# Patient Record
Sex: Male | Born: 1950 | Race: White | Hispanic: No | Smoking: Former smoker
Health system: Southern US, Community
[De-identification: ages and names within clinical notes are randomized; demographics above are authoritative.]

## PROBLEM LIST (undated history)

## (undated) DIAGNOSIS — J449 Chronic obstructive pulmonary disease, unspecified: Secondary | ICD-10-CM

## (undated) DIAGNOSIS — M797 Fibromyalgia: Secondary | ICD-10-CM

## (undated) DIAGNOSIS — C61 Malignant neoplasm of prostate: Secondary | ICD-10-CM

## (undated) DIAGNOSIS — M25512 Pain in left shoulder: Secondary | ICD-10-CM

## (undated) DIAGNOSIS — J4489 Other specified chronic obstructive pulmonary disease: Secondary | ICD-10-CM

## (undated) DIAGNOSIS — Z8619 Personal history of other infectious and parasitic diseases: Secondary | ICD-10-CM

## (undated) DIAGNOSIS — K589 Irritable bowel syndrome without diarrhea: Secondary | ICD-10-CM

## (undated) DIAGNOSIS — T8859XA Other complications of anesthesia, initial encounter: Secondary | ICD-10-CM

## (undated) DIAGNOSIS — T4145XA Adverse effect of unspecified anesthetic, initial encounter: Secondary | ICD-10-CM

## (undated) DIAGNOSIS — C801 Malignant (primary) neoplasm, unspecified: Secondary | ICD-10-CM

## (undated) DIAGNOSIS — E7800 Pure hypercholesterolemia, unspecified: Secondary | ICD-10-CM

## (undated) DIAGNOSIS — K219 Gastro-esophageal reflux disease without esophagitis: Secondary | ICD-10-CM

## (undated) DIAGNOSIS — Z923 Personal history of irradiation: Secondary | ICD-10-CM

## (undated) HISTORY — PX: PROSTATE BIOPSY: SHX241

## (undated) HISTORY — PX: APPENDECTOMY: SHX54

## (undated) HISTORY — DX: Other specified chronic obstructive pulmonary disease: J44.89

## (undated) HISTORY — DX: Irritable bowel syndrome, unspecified: K58.9

## (undated) HISTORY — DX: Pain in left shoulder: M25.512

## (undated) HISTORY — DX: Chronic obstructive pulmonary disease, unspecified: J44.9

## (undated) HISTORY — PX: CHOLECYSTECTOMY: SHX55

## (undated) HISTORY — DX: Pure hypercholesterolemia, unspecified: E78.00

## (undated) HISTORY — PX: WISDOM TOOTH EXTRACTION: SHX21

## (undated) HISTORY — PX: COLONOSCOPY: SHX174

## (undated) HISTORY — DX: Personal history of other infectious and parasitic diseases: Z86.19

---

## 1998-02-12 ENCOUNTER — Encounter: Payer: Self-pay | Admitting: *Deleted

## 1998-02-12 ENCOUNTER — Ambulatory Visit (HOSPITAL_COMMUNITY): Admission: RE | Admit: 1998-02-12 | Discharge: 1998-02-12 | Payer: Self-pay | Admitting: *Deleted

## 1998-05-10 ENCOUNTER — Emergency Department (HOSPITAL_COMMUNITY): Admission: EM | Admit: 1998-05-10 | Discharge: 1998-05-11 | Payer: Self-pay | Admitting: Emergency Medicine

## 2004-03-10 ENCOUNTER — Ambulatory Visit: Payer: Self-pay | Admitting: Psychology

## 2004-03-30 ENCOUNTER — Ambulatory Visit: Payer: Self-pay | Admitting: Psychology

## 2004-04-21 ENCOUNTER — Ambulatory Visit: Payer: Self-pay | Admitting: Psychology

## 2004-08-17 ENCOUNTER — Ambulatory Visit: Payer: Self-pay | Admitting: Psychology

## 2004-09-08 ENCOUNTER — Ambulatory Visit: Payer: Self-pay | Admitting: Psychology

## 2004-11-01 ENCOUNTER — Ambulatory Visit: Payer: Self-pay | Admitting: Psychology

## 2004-11-25 ENCOUNTER — Ambulatory Visit: Payer: Self-pay | Admitting: Psychology

## 2004-12-08 ENCOUNTER — Ambulatory Visit: Payer: Self-pay | Admitting: Psychology

## 2004-12-23 ENCOUNTER — Ambulatory Visit: Payer: Self-pay | Admitting: Pulmonary Disease

## 2004-12-24 ENCOUNTER — Ambulatory Visit: Payer: Self-pay | Admitting: Psychology

## 2004-12-29 ENCOUNTER — Ambulatory Visit: Payer: Self-pay | Admitting: Pulmonary Disease

## 2005-01-26 ENCOUNTER — Ambulatory Visit: Payer: Self-pay | Admitting: Psychology

## 2005-02-08 ENCOUNTER — Ambulatory Visit: Payer: Self-pay | Admitting: Psychology

## 2005-03-01 ENCOUNTER — Ambulatory Visit: Payer: Self-pay | Admitting: Psychology

## 2005-03-25 ENCOUNTER — Ambulatory Visit: Payer: Self-pay | Admitting: Psychology

## 2005-04-12 ENCOUNTER — Ambulatory Visit: Admission: RE | Admit: 2005-04-12 | Discharge: 2005-04-29 | Payer: Self-pay | Admitting: Radiation Oncology

## 2005-04-14 ENCOUNTER — Ambulatory Visit: Payer: Self-pay | Admitting: Psychology

## 2005-05-05 ENCOUNTER — Ambulatory Visit: Payer: Self-pay | Admitting: Psychology

## 2005-05-26 ENCOUNTER — Ambulatory Visit: Payer: Self-pay | Admitting: Psychology

## 2005-06-24 ENCOUNTER — Ambulatory Visit: Payer: Self-pay | Admitting: Psychology

## 2005-07-26 ENCOUNTER — Emergency Department (HOSPITAL_COMMUNITY): Admission: EM | Admit: 2005-07-26 | Discharge: 2005-07-26 | Payer: Self-pay | Admitting: Emergency Medicine

## 2005-08-05 ENCOUNTER — Ambulatory Visit: Payer: Self-pay | Admitting: Psychology

## 2005-08-18 ENCOUNTER — Ambulatory Visit (HOSPITAL_COMMUNITY): Admission: RE | Admit: 2005-08-18 | Discharge: 2005-08-18 | Payer: Self-pay | Admitting: General Surgery

## 2005-08-18 ENCOUNTER — Encounter (INDEPENDENT_AMBULATORY_CARE_PROVIDER_SITE_OTHER): Payer: Self-pay | Admitting: Specialist

## 2005-08-25 ENCOUNTER — Ambulatory Visit: Payer: Self-pay | Admitting: Psychology

## 2005-09-08 ENCOUNTER — Ambulatory Visit: Payer: Self-pay | Admitting: Psychology

## 2005-09-22 ENCOUNTER — Ambulatory Visit: Payer: Self-pay | Admitting: Pulmonary Disease

## 2005-10-06 ENCOUNTER — Ambulatory Visit: Payer: Self-pay | Admitting: Psychology

## 2005-10-31 ENCOUNTER — Ambulatory Visit: Payer: Self-pay | Admitting: Psychology

## 2006-06-07 ENCOUNTER — Ambulatory Visit: Payer: Self-pay | Admitting: Pulmonary Disease

## 2006-06-09 ENCOUNTER — Ambulatory Visit: Payer: Self-pay | Admitting: Pulmonary Disease

## 2006-06-09 LAB — CONVERTED CEMR LAB
ALT: 31 units/L (ref 0–40)
AST: 30 units/L (ref 0–37)
Albumin: 3.9 g/dL (ref 3.5–5.2)
Alkaline Phosphatase: 64 units/L (ref 39–117)
BUN: 15 mg/dL (ref 6–23)
Basophils Absolute: 0 10*3/uL (ref 0.0–0.1)
Basophils Relative: 0.6 % (ref 0.0–1.0)
Bilirubin, Direct: 0.1 mg/dL (ref 0.0–0.3)
CO2: 29 meq/L (ref 19–32)
Calcium: 9.3 mg/dL (ref 8.4–10.5)
Chloride: 109 meq/L (ref 96–112)
Cholesterol: 168 mg/dL (ref 0–200)
Creatinine, Ser: 1.2 mg/dL (ref 0.4–1.5)
Eosinophils Absolute: 0.1 10*3/uL (ref 0.0–0.6)
Eosinophils Relative: 2.2 % (ref 0.0–5.0)
GFR calc Af Amer: 81 mL/min
GFR calc non Af Amer: 67 mL/min
Glucose, Bld: 99 mg/dL (ref 70–99)
HCT: 41.2 % (ref 39.0–52.0)
HDL: 37.8 mg/dL — ABNORMAL LOW (ref >39.0)
Hemoglobin: 14.4 g/dL (ref 13.0–17.0)
LDL Cholesterol: 117 mg/dL — ABNORMAL HIGH (ref 0–99)
Lymphocytes Relative: 27.7 % (ref 12.0–46.0)
MCHC: 34.9 g/dL (ref 30.0–36.0)
MCV: 91.5 fL (ref 78.0–100.0)
Monocytes Absolute: 0.4 10*3/uL (ref 0.2–0.7)
Monocytes Relative: 9.7 % (ref 3.0–11.0)
Neutro Abs: 2.8 10*3/uL (ref 1.4–7.7)
Neutrophils Relative %: 59.8 % (ref 43.0–77.0)
PSA: 7.41 ng/mL — ABNORMAL HIGH (ref 0.10–4.00)
Platelets: 165 10*3/uL (ref 150–400)
Potassium: 4.2 meq/L (ref 3.5–5.1)
RBC: 4.51 M/uL (ref 4.22–5.81)
RDW: 12.3 % (ref 11.5–14.6)
Sodium: 142 meq/L (ref 135–145)
TSH: 1.58 microintl units/mL (ref 0.35–5.50)
Total Bilirubin: 1 mg/dL (ref 0.3–1.2)
Total CHOL/HDL Ratio: 4.4
Total Protein: 6.7 g/dL (ref 6.0–8.3)
Triglycerides: 68 mg/dL (ref 0–149)
VLDL: 14 mg/dL (ref 0–40)
WBC: 4.6 10*3/uL (ref 4.5–10.5)

## 2006-10-05 ENCOUNTER — Emergency Department (HOSPITAL_COMMUNITY): Admission: EM | Admit: 2006-10-05 | Discharge: 2006-10-06 | Payer: Self-pay | Admitting: Emergency Medicine

## 2007-02-19 ENCOUNTER — Ambulatory Visit: Payer: Self-pay | Admitting: Pulmonary Disease

## 2007-02-27 DIAGNOSIS — C61 Malignant neoplasm of prostate: Secondary | ICD-10-CM

## 2007-02-27 HISTORY — DX: Malignant neoplasm of prostate: C61

## 2007-04-09 ENCOUNTER — Ambulatory Visit: Payer: Self-pay | Admitting: Pulmonary Disease

## 2007-04-09 DIAGNOSIS — K589 Irritable bowel syndrome without diarrhea: Secondary | ICD-10-CM | POA: Insufficient documentation

## 2007-04-09 DIAGNOSIS — J42 Unspecified chronic bronchitis: Secondary | ICD-10-CM | POA: Insufficient documentation

## 2007-04-09 DIAGNOSIS — C61 Malignant neoplasm of prostate: Secondary | ICD-10-CM | POA: Insufficient documentation

## 2007-04-09 DIAGNOSIS — Z8546 Personal history of malignant neoplasm of prostate: Secondary | ICD-10-CM | POA: Insufficient documentation

## 2007-04-09 DIAGNOSIS — Z87898 Personal history of other specified conditions: Secondary | ICD-10-CM | POA: Insufficient documentation

## 2007-04-09 DIAGNOSIS — Z8619 Personal history of other infectious and parasitic diseases: Secondary | ICD-10-CM

## 2007-09-06 ENCOUNTER — Ambulatory Visit: Payer: Self-pay | Admitting: Pulmonary Disease

## 2007-09-06 ENCOUNTER — Telehealth: Payer: Self-pay | Admitting: Pulmonary Disease

## 2007-09-10 ENCOUNTER — Telehealth (INDEPENDENT_AMBULATORY_CARE_PROVIDER_SITE_OTHER): Payer: Self-pay | Admitting: *Deleted

## 2007-10-04 LAB — CONVERTED CEMR LAB: PSA: 9.06 ng/mL — ABNORMAL HIGH (ref 0.10–4.00)

## 2008-07-16 ENCOUNTER — Encounter: Payer: Self-pay | Admitting: Pulmonary Disease

## 2008-07-29 ENCOUNTER — Ambulatory Visit: Admission: RE | Admit: 2008-07-29 | Discharge: 2008-10-14 | Payer: Self-pay | Admitting: Radiation Oncology

## 2008-07-30 ENCOUNTER — Encounter: Payer: Self-pay | Admitting: Pulmonary Disease

## 2008-12-02 ENCOUNTER — Telehealth (INDEPENDENT_AMBULATORY_CARE_PROVIDER_SITE_OTHER): Payer: Self-pay | Admitting: *Deleted

## 2008-12-20 ENCOUNTER — Emergency Department (HOSPITAL_COMMUNITY): Admission: EM | Admit: 2008-12-20 | Discharge: 2008-12-20 | Payer: Self-pay | Admitting: Emergency Medicine

## 2009-04-06 ENCOUNTER — Ambulatory Visit: Payer: Self-pay | Admitting: Pulmonary Disease

## 2009-04-06 DIAGNOSIS — M25519 Pain in unspecified shoulder: Secondary | ICD-10-CM

## 2009-04-06 DIAGNOSIS — E785 Hyperlipidemia, unspecified: Secondary | ICD-10-CM | POA: Insufficient documentation

## 2009-04-07 ENCOUNTER — Ambulatory Visit: Payer: Self-pay | Admitting: Pulmonary Disease

## 2009-04-10 LAB — CONVERTED CEMR LAB
ALT: 32 units/L (ref 0–53)
AST: 30 units/L (ref 0–37)
Albumin: 4 g/dL (ref 3.5–5.2)
Alkaline Phosphatase: 80 units/L (ref 39–117)
BUN: 14 mg/dL (ref 6–23)
Basophils Absolute: 0 10*3/uL (ref 0.0–0.1)
Basophils Relative: 0.5 % (ref 0.0–3.0)
Bilirubin Urine: NEGATIVE
Bilirubin, Direct: 0.2 mg/dL (ref 0.0–0.3)
CO2: 31 meq/L (ref 19–32)
Calcium: 9.3 mg/dL (ref 8.4–10.5)
Chloride: 107 meq/L (ref 96–112)
Cholesterol: 142 mg/dL (ref 0–200)
Creatinine, Ser: 1.2 mg/dL (ref 0.4–1.5)
Eosinophils Absolute: 0.1 10*3/uL (ref 0.0–0.7)
Eosinophils Relative: 3 % (ref 0.0–5.0)
GFR calc non Af Amer: 66.15 mL/min (ref >60)
Glucose, Bld: 90 mg/dL (ref 70–99)
HCT: 41.4 % (ref 39.0–52.0)
HDL: 27.8 mg/dL — ABNORMAL LOW (ref >39.00)
Hemoglobin, Urine: NEGATIVE
Hemoglobin: 14.4 g/dL (ref 13.0–17.0)
Ketones, ur: NEGATIVE mg/dL
LDL Cholesterol: 93 mg/dL (ref 0–99)
Leukocytes, UA: NEGATIVE
Lymphocytes Relative: 32.8 % (ref 12.0–46.0)
Lymphs Abs: 1.4 10*3/uL (ref 0.7–4.0)
MCHC: 34.7 g/dL (ref 30.0–36.0)
MCV: 91.6 fL (ref 78.0–100.0)
Monocytes Absolute: 0.6 10*3/uL (ref 0.1–1.0)
Monocytes Relative: 14.6 % — ABNORMAL HIGH (ref 3.0–12.0)
Neutro Abs: 2.3 10*3/uL (ref 1.4–7.7)
Neutrophils Relative %: 49.1 % (ref 43.0–77.0)
Nitrite: NEGATIVE
PSA: 12.47 ng/mL — ABNORMAL HIGH (ref 0.10–4.00)
Platelets: 171 10*3/uL (ref 150.0–400.0)
Potassium: 4.5 meq/L (ref 3.5–5.1)
RBC: 4.51 M/uL (ref 4.22–5.81)
RDW: 12.7 % (ref 11.5–14.6)
Sodium: 143 meq/L (ref 135–145)
Specific Gravity, Urine: 1.015 (ref 1.000–1.030)
TSH: 1.62 microintl units/mL (ref 0.35–5.50)
Total Bilirubin: 1.1 mg/dL (ref 0.3–1.2)
Total CHOL/HDL Ratio: 5
Total Protein, Urine: NEGATIVE mg/dL
Total Protein: 6.7 g/dL (ref 6.0–8.3)
Triglycerides: 105 mg/dL (ref 0.0–149.0)
Urine Glucose: NEGATIVE mg/dL
Urobilinogen, UA: 0.2 (ref 0.0–1.0)
VLDL: 21 mg/dL (ref 0.0–40.0)
WBC: 4.4 10*3/uL — ABNORMAL LOW (ref 4.5–10.5)
pH: 6 (ref 5.0–8.0)

## 2009-04-14 ENCOUNTER — Encounter: Payer: Self-pay | Admitting: Pulmonary Disease

## 2009-05-07 ENCOUNTER — Encounter: Payer: Self-pay | Admitting: Pulmonary Disease

## 2009-06-03 ENCOUNTER — Encounter (HOSPITAL_COMMUNITY): Admission: RE | Admit: 2009-06-03 | Discharge: 2009-08-19 | Payer: Self-pay | Admitting: Urology

## 2009-07-17 ENCOUNTER — Encounter: Payer: Self-pay | Admitting: Pulmonary Disease

## 2009-08-05 ENCOUNTER — Inpatient Hospital Stay (HOSPITAL_COMMUNITY): Admission: RE | Admit: 2009-08-05 | Discharge: 2009-08-08 | Payer: Self-pay | Admitting: Urology

## 2009-08-05 ENCOUNTER — Encounter (INDEPENDENT_AMBULATORY_CARE_PROVIDER_SITE_OTHER): Payer: Self-pay | Admitting: Urology

## 2009-08-06 HISTORY — PX: PROSTATECTOMY: SHX69

## 2009-12-30 ENCOUNTER — Encounter: Payer: Self-pay | Admitting: Pulmonary Disease

## 2010-02-02 ENCOUNTER — Encounter: Payer: Self-pay | Admitting: Pulmonary Disease

## 2010-06-08 NOTE — Letter (Signed)
Summary: History/[facility]   Imported By: Lester [facility] 01/19/2010 08:06:02  _____________________________________________________________________  External Attachment:    Type:   Image     Comment:   External Document

## 2010-06-08 NOTE — Letter (Signed)
Summary: [facility]   Imported By: Sherian Rein 08/05/2009 13:16:35  _____________________________________________________________________  External Attachment:    Type:   Image     Comment:   External Document

## 2010-06-08 NOTE — Procedures (Signed)
Summary: [facility] / [facility]  [facility] / [facility]   Imported By: Lennie Odor 09/01/2009 11:40:11  _____________________________________________________________________  External Attachment:    Type:   Image     Comment:   External Document

## 2010-06-08 NOTE — Letter (Signed)
Summary: [facility]   Imported By: Sherian Rein 01/12/2010 14:03:54  _____________________________________________________________________  External Attachment:    Type:   Image     Comment:   External Document

## 2010-06-08 NOTE — Letter (Signed)
Summary: Office Note/[facility]  Office Note/[facility]   Imported By: Sherian Rein 05/11/2009 09:15:36  _____________________________________________________________________  External Attachment:    Type:   Image     Comment:   External Document

## 2010-06-08 NOTE — Letter (Signed)
Summary: [facility]  [facility]   Imported By: Sherian Rein 03/01/2010 17:06:05  _____________________________________________________________________  External Attachment:    Type:   Image     Comment:   External Document

## 2010-07-23 ENCOUNTER — Encounter: Payer: Self-pay | Admitting: Pulmonary Disease

## 2010-07-27 NOTE — Miscellaneous (Signed)
Summary: Orders Update/baseline BMD  Clinical Lists Changes  Orders: Added new Test order of T-Bone Densitometry [phone]) - Signed Added new Test order of T-Lumbar Vertebral Assessment [phone]) - Signed

## 2010-08-01 LAB — CBC
HCT: 32.4 % — ABNORMAL LOW (ref 39.0–52.0)
HCT: 37.9 % — ABNORMAL LOW (ref 39.0–52.0)
HCT: 40.7 % (ref 39.0–52.0)
Hemoglobin: 12.6 g/dL — ABNORMAL LOW (ref 13.0–17.0)
Hemoglobin: 13.5 g/dL (ref 13.0–17.0)
MCV: 92.1 fL (ref 78.0–100.0)
MCV: 92.6 fL (ref 78.0–100.0)
Platelets: 158 10*3/uL (ref 150–400)
RBC: 3.5 MIL/uL — ABNORMAL LOW (ref 4.22–5.81)
RBC: 4.42 MIL/uL (ref 4.22–5.81)
WBC: 10.2 10*3/uL (ref 4.0–10.5)
WBC: 11 10*3/uL — ABNORMAL HIGH (ref 4.0–10.5)
WBC: 5.2 10*3/uL (ref 4.0–10.5)

## 2010-08-01 LAB — DIFFERENTIAL
Eosinophils Absolute: 0 10*3/uL (ref 0.0–0.7)
Lymphs Abs: 0.5 10*3/uL — ABNORMAL LOW (ref 0.7–4.0)
Lymphs Abs: 0.8 10*3/uL (ref 0.7–4.0)
Monocytes Relative: 1 % — ABNORMAL LOW (ref 3–12)
Monocytes Relative: 8 % (ref 3–12)
Neutro Abs: 10 10*3/uL — ABNORMAL HIGH (ref 1.7–7.7)
Neutrophils Relative %: 87 % — ABNORMAL HIGH (ref 43–77)
Neutrophils Relative %: 91 % — ABNORMAL HIGH (ref 43–77)

## 2010-08-01 LAB — BASIC METABOLIC PANEL
BUN: 13 mg/dL (ref 6–23)
Calcium: 8.7 mg/dL (ref 8.4–10.5)
Chloride: 108 mEq/L (ref 96–112)
Creatinine, Ser: 1.13 mg/dL (ref 0.4–1.5)
GFR calc Af Amer: >60 mL/min (ref >60)
GFR calc non Af Amer: >60 mL/min (ref >60)
Potassium: 3.2 mEq/L — ABNORMAL LOW (ref 3.5–5.1)
Potassium: 4.2 mEq/L (ref 3.5–5.1)
Sodium: 141 mEq/L (ref 135–145)

## 2010-08-01 LAB — CREATININE, FLUID (PLEURAL, PERITONEAL, JP DRAINAGE)

## 2010-08-01 LAB — POTASSIUM: Potassium: 3.5 mEq/L (ref 3.5–5.1)

## 2010-08-01 LAB — COMPREHENSIVE METABOLIC PANEL
BUN: 15 mg/dL (ref 6–23)
CO2: 30 mEq/L (ref 19–32)
Chloride: 105 mEq/L (ref 96–112)
Creatinine, Ser: 1.04 mg/dL (ref 0.4–1.5)
GFR calc non Af Amer: >60 mL/min (ref >60)
Total Bilirubin: 0.9 mg/dL (ref 0.3–1.2)

## 2010-08-14 LAB — COMPREHENSIVE METABOLIC PANEL
Alkaline Phosphatase: 57 U/L (ref 39–117)
BUN: 17 mg/dL (ref 6–23)
CO2: 26 mEq/L (ref 19–32)
Chloride: 106 mEq/L (ref 96–112)
GFR calc non Af Amer: >60 mL/min (ref >60)
Glucose, Bld: 116 mg/dL — ABNORMAL HIGH (ref 70–99)
Potassium: 3.9 mEq/L (ref 3.5–5.1)
Total Bilirubin: 0.9 mg/dL (ref 0.3–1.2)
Total Protein: 7 g/dL (ref 6.0–8.3)

## 2010-08-14 LAB — CBC
HCT: 44.1 % (ref 39.0–52.0)
Hemoglobin: 14.8 g/dL (ref 13.0–17.0)
RBC: 4.8 MIL/uL (ref 4.22–5.81)
RDW: 13.1 % (ref 11.5–15.5)

## 2010-08-14 LAB — DIFFERENTIAL
Basophils Absolute: 0 10*3/uL (ref 0.0–0.1)
Basophils Relative: 0 % (ref 0–1)
Eosinophils Absolute: 0.1 10*3/uL (ref 0.0–0.7)
Neutro Abs: 6.5 10*3/uL (ref 1.7–7.7)
Neutrophils Relative %: 79 % — ABNORMAL HIGH (ref 43–77)

## 2010-09-24 NOTE — Op Note (Signed)
Christian Ward, OTTERSON              ACCOUNT NO.:  [id number]   MEDICAL RECORD NO.:  [id number]          PATIENT TYPE:  AMB   LOCATION:  DAY                          FACILITY:  [facility]   PHYSICIAN:  Angelia Mould. Derrell Lolling, M.D.DATE OF BIRTH:  02/19/1951   DATE OF PROCEDURE:  08/18/2005  DATE OF DISCHARGE:                                 OPERATIVE REPORT   PREOPERATIVE DIAGNOSIS:  Chronic cholecystitis with cholelithiasis.   POSTOPERATIVE DIAGNOSIS:  Chronic cholecystitis with cholelithiasis.   OPERATION PERFORMED:  Laparoscopic cholecystectomy with intraoperative  cholangiogram.   SURGEON:  Angelia Mould. Derrell Lolling, M.D.   FIRST ASSISTANT:  Ovidio Kin, MD   OPERATIVE INDICATIONS:  This is a 60 year old white male who has had some  indigestion and occasional attacks of nausea for several years.  Recently  after a large medial he developed progressive epigastric sharp stabbing pain  with two episodes of nausea and vomiting, was evaluated in the [facility]  emergency room.  After receiving analgesics his pain resolved and he became  asymptomatic.  Abdominal ultrasound demonstrated multiple small gallstones  but no other abnormalities.  He was sent home and followed up with me in the  office recently at which time he was counseled regarding elective  cholecystectomy.  He is brought to hospital electively for that.   OPERATIVE FINDINGS:  The gallbladder was chronically inflamed, slightly  thick-walled and was packed with numerous multifaceted yellow stones.  The  cystic duct was tiny but was patent.  The cholangiogram was normal showing  normal caliber bile ducts, normal intrahepatic and extrahepatic bile duct  anatomy, no filling defect, no obstruction with good flow of contrast into  the duodenum.  The liver and stomach, duodenum, small bowel large bowel and  omentum and peritoneal surfaces otherwise looked normal to inspection.   OPERATIVE TECHNIQUE:  Following induction of general  endotracheal  anesthesia, the patient's abdomen was prepped and draped in sterile fashion.  He had been given antibiotics preoperatively.  The pneumatic compression  stockings on his calves were placed preanesthesia.  The patient was  identified.  After prepping and draping the abdomen, 0.5% Marcaine with  epinephrine was used as a local infiltration anesthetic.  A curved  transverse incision was made at the superior rim of the umbilicus.  The  fascia was incised in the midline.  The abdominal cavity entered under  direct vision.  A 10 mm Hasson trocar was inserted and secured with a  pursestring suture of 0 Vicryl.  Pneumoperitoneum was created.  The video  camera was inserted with visualization and findings as described above.  10  mm trocar was placed in the subxiphoid region two 5 mm trocars placed the  right mid abdomen.  Gallbladder fundus was elevated.  The infundibulum was  retracted.  There was a lot of fatty tissue around the lower infundibulum  which had to be carefully dissected away from the infundibulum to dissect  out the cystic duct and cystic artery.  We ultimately isolated the cystic  artery, secured it with multiple metal clips and divided it.  We then  dissected the cystic  duct free, made sure we had a large window behind the  cystic duct.  The cholangiogram catheter was inserted into the cystic duct.  A cholangiogram was obtained with normal findings as described above.  The  cholangiogram catheter was removed.  The cystic duct was secured with  multiple metal clips and divided.  Gallbladder was dissected from its bed  with electrocautery and placed in the specimen bag and removed.  We did make  one hole in the gallbladder and spilled a couple of gallstones and we did  retrieve those and removed those from the abdominal cavity.  After removing  the gallbladder from the abdominal cavity, we then spent a good deal of time  irrigating the operative field and subhepatic  space and subphrenic space  until we were sure all the irrigation fluid was completely clear.  There  were no stones and there was no bleeding or bile leak from the area of  dissection.  A survey of the abdomen revealed no other abnormalities.  The  trocars were removed under direct vision.  There is no bleeding from trocar  sites.  Pneumoperitoneum was released.  The fascia at the umbilicus was  closed with 0 Vicryl sutures.  Skin incisions were closed with subcuticular  sutures of 4-0 Monocryl, 4-0 nylon and Steri-Strips.  Clean bandages were  placed.  The patient taken recovery room in stable condition.  Estimated  blood loss was about 10 mL.  Complications were none.  Sponge, needle and  instrument counts were correct.      Angelia Mould. Derrell Lolling, M.D.  Electronically Signed     HMI/MEDQ  D:  08/18/2005  T:  08/18/2005  Job:  [id number]   cc:   Windy Fast L. Earlene Plater, M.D.  Fax: [phone]   Lonzo Cloud. Kriste Basque, M.D. [facility]  [address]  [place]  [place] [address]   Griffith Citron, M.D.  Fax: [phone]

## 2010-09-24 NOTE — H&P (Signed)
Christian Ward, MINER NO.:  [id number]   MEDICAL RECORD NO.:  [id number]          PATIENT TYPE:  EMS   LOCATION:  ED                           FACILITY:  [facility]   PHYSICIAN:  Adolph Pollack, M.D.DATE OF BIRTH:  [birth year]   DATE OF ADMISSION:  07/26/2005  DATE OF DISCHARGE:  07/26/2005                                HISTORY & PHYSICAL   EMERGENCY DEPARTMENT VISIT NOTE   REASON FOR COMING TO THE EMERGENCY ROOM:  Severe upper abdominal pain.   HISTORY OF PRESENT ILLNESS:  Christian Ward is a 60 year old male who got home  late last night at 9 p.m., had a lasagna dinner with salad and salad  dressing, then after that developed progressively-increasing pressure-type  epigastric pain that worsened throughout the night. He had two episodes of  nausea and vomiting. He attempt to take some old Phenergan suppositories but  to no avail. He presented to the emergency department as the pain continued  to increase in severity. Once he was here in the emergency room he received  a shot of Dilaudid and the pain resolved fairly quickly. He was evaluated  here and underwent an abdominal ultrasound that demonstrated gallstones with  no wall thickening. Because of this, I was asked to see him. He denies fever  or chills. Right now he just feels a little tired.   PAST MEDICAL HISTORY:  Recently diagnosed prostate cancer.   PREVIOUS OPERATIONS:  Appendectomy.   ALLERGIES:  None.   MEDICATIONS:  MiraLax p.r.n.   SOCIAL HISTORY:  Former smoker, occasionally has an alcohol beverage.  Married. [place].   FAMILY HISTORY:  Mother died of lung disease back in the 91s. His father  has prostate cancer, heart disease, strokes, and hypertension.   REVIEW OF SYSTEMS:  Generally, he has had a 5-pound intentional weight loss.  He normally has good energy. CARDIOVASCULAR:  Denies any know heart disease  or hypertension. PULMONARY:  He denies any chronic lung disease,  asthma,  pneumonia, or TB. GI:  He denies any peptic ulcer disease, hepatitis,  diverticulitis. He says in the past he has had a nausea and vomiting type  illness that occasionally brought him to the emergency room for Phenergan.  He has also been on some MiraLax per Dr. Kinnie Scales p.r.n. to help him have  bowel movements. He used to have a bowel every-other day and now he has one  about once or twice a day. GU:  He has recently diagnosed prostate cancer  and he is contemplating his treatment options at this time. ENDOCRINE:  Slightly elevated LDL. No diabetes. NEUROLOGIC:  No strokes or seizures.  HEMATOLOGIC:  No known bleeding disorders, blood clots, transfusions.   PHYSICAL EXAMINATION:  GENERAL:  A well-developed, well-nourished male,  appears tired but no acute distress.  VITAL SIGNS:  Temperature is 98.4, blood pressure 113/60, pulse 75.  EYES:  Extraocular movements intact. No icterus.  NECK:  Supple without masses or obvious thyroid enlargement.  RESPIRATORY:  The breath sounds are equal and clear, respirations not  labored.  CARDIOVASCULAR:  Heart demonstrates a regular  rate and rhythm. I do not hear  a murmur. No lower extremity edema. No JVD.  ABDOMEN:  Soft. There is a right lower quadrant scar. There is no tenderness  to palpation or percussion and no Murphy's sign. No palpable masses. He does  have some bowel sounds present.  EXTREMITIES:  He has good muscle tone present.  SKIN:  No jaundice.   LABORATORY DATA:  White cell count is normal at 9400; hemoglobin 14.2;  platelet count is 180,000. Liver function tests within normal limits.  Glucose slightly elevated at 140. His lipase is normal at 32. UA is  negative.   Ultrasound review with report as above. Report demonstrated cholelithiasis  with no gallbladder wall thickening and biliary dilatation.   IMPRESSION:  Acute biliary colic secondary to cholelithiasis - acute attack  has resolved. No evidence of acute  cholecystitis at this time.   PLAN:  I had a long discussion with him about treatment options. Certainly I  told him that he needed to be on a strict low-fat diet but I could not  guarantee even with this that he would not have another episode. I also  discussed with him elective laparoscopic cholecystectomy. We went over the  procedure and the risks. The risks include but not limited to bleeding,  infection, wound healing problems, anesthesia, bile leak, accidental injury  to the common bile duct/liver/small intestine, and post cholecystectomy  diarrhea. He is a patient of Dr. Jodelle Green and Dr. Kriste Basque had called Dr. Derrell Lolling  personally to see him. However, Dr. Derrell Lolling is in the operating room so I  have seen Christian Ward. What we plan to do is plan to get an appointment  with Dr. Derrell Lolling for Christian Ward to go discuss whether he wants to proceed  with cholecystectomy or not. We will have our office call him and arrange  for that appointment. I told him certainly if he has recurrent pain that  severe that he should call the office. I will give him some Vicodin and  Phenergan to go home with.      Adolph Pollack, M.D.  Electronically Signed     TJR/MEDQ  D:  07/26/2005  T:  07/26/2005  Job:  [id number]   cc:   Lonzo Cloud. Kriste Basque, M.D. [facility]  [address]  [place]  [place] [address]   Griffith Citron, M.D.  Fax: [phone]

## 2010-11-19 ENCOUNTER — Telehealth: Payer: Self-pay | Admitting: Pulmonary Disease

## 2010-11-19 MED ORDER — AZITHROMYCIN 250 MG PO TABS: ORAL_TABLET | ORAL | Status: AC

## 2010-11-19 NOTE — Telephone Encounter (Signed)
Spoke with pt.  He c/o head congestion, sinus pressure, PND, prod cough, and blowing mucus out of nose.  Symptoms started last weekend but mucus turned green x 2 days ago.  ?able low grade fever.  Using benadryl and tylenol with relief for a short period of time.  Also taking a generic sinus decongestant prn.  Requesting OV for today but TP and SN are out of office.  nkda - verified.  [facility] [address]  Will forward message to MW - pls advise.  Thanks!

## 2010-11-19 NOTE — Telephone Encounter (Signed)
Called, spoke with pt.  He is aware to take  z pak, irrigate sinuses with normal saline, stop benadryl, and use advil cold and sinus per MW.  He verbalized understanding of this and will call back if symptoms do not improve.

## 2010-11-19 NOTE — Telephone Encounter (Signed)
PATIENT'S WIFE RETURNED CALL.  PLEASE CALL BACK AT CELL [phone]

## 2010-11-19 NOTE — Telephone Encounter (Signed)
rec zpak and irrigate sinuses with normal saline Stop benadryl and use advil cold and sinus

## 2010-11-19 NOTE — Telephone Encounter (Signed)
Called # provided above - [id number] 

## 2011-02-20 ENCOUNTER — Telehealth: Payer: Self-pay | Admitting: Internal Medicine

## 2011-02-20 NOTE — Telephone Encounter (Signed)
On call- 02/19/11- Pt reported 4 days of head congestion/ chest cold, green nasal mucus, low grade fever. Plan- [facility] [phone]. Suggested sudafed, mucinex.

## 2011-03-17 DIAGNOSIS — N393 Stress incontinence (female) (male): Secondary | ICD-10-CM | POA: Insufficient documentation

## 2011-03-17 DIAGNOSIS — N529 Male erectile dysfunction, unspecified: Secondary | ICD-10-CM | POA: Insufficient documentation

## 2011-09-29 ENCOUNTER — Observation Stay (HOSPITAL_COMMUNITY): Payer: BC Managed Care – PPO

## 2011-09-29 ENCOUNTER — Observation Stay (HOSPITAL_COMMUNITY)
Admission: EM | Admit: 2011-09-29 | Discharge: 2011-09-30 | Disposition: A | Discharge status: Home / Self Care | Payer: BC Managed Care – PPO | Attending: Internal Medicine | Admitting: Internal Medicine

## 2011-09-29 ENCOUNTER — Emergency Department (HOSPITAL_COMMUNITY): Payer: BC Managed Care – PPO

## 2011-09-29 ENCOUNTER — Encounter (HOSPITAL_COMMUNITY): Payer: Self-pay | Admitting: Emergency Medicine

## 2011-09-29 DIAGNOSIS — Z79899 Other long term (current) drug therapy: Secondary | ICD-10-CM | POA: Insufficient documentation

## 2011-09-29 DIAGNOSIS — R17 Unspecified jaundice: Secondary | ICD-10-CM

## 2011-09-29 DIAGNOSIS — N1330 Unspecified hydronephrosis: Secondary | ICD-10-CM | POA: Insufficient documentation

## 2011-09-29 DIAGNOSIS — E876 Hypokalemia: Secondary | ICD-10-CM | POA: Insufficient documentation

## 2011-09-29 DIAGNOSIS — R1115 Cyclical vomiting syndrome unrelated to migraine: Secondary | ICD-10-CM

## 2011-09-29 DIAGNOSIS — R112 Nausea with vomiting, unspecified: Secondary | ICD-10-CM

## 2011-09-29 DIAGNOSIS — K219 Gastro-esophageal reflux disease without esophagitis: Secondary | ICD-10-CM | POA: Diagnosis present

## 2011-09-29 DIAGNOSIS — Z8546 Personal history of malignant neoplasm of prostate: Secondary | ICD-10-CM | POA: Insufficient documentation

## 2011-09-29 DIAGNOSIS — K589 Irritable bowel syndrome without diarrhea: Secondary | ICD-10-CM | POA: Insufficient documentation

## 2011-09-29 DIAGNOSIS — E782 Mixed hyperlipidemia: Secondary | ICD-10-CM

## 2011-09-29 DIAGNOSIS — Z9089 Acquired absence of other organs: Secondary | ICD-10-CM | POA: Insufficient documentation

## 2011-09-29 DIAGNOSIS — N390 Urinary tract infection, site not specified: Secondary | ICD-10-CM | POA: Insufficient documentation

## 2011-09-29 HISTORY — DX: Malignant (primary) neoplasm, unspecified: C80.1

## 2011-09-29 HISTORY — DX: Adverse effect of unspecified anesthetic, initial encounter: T41.45XA

## 2011-09-29 HISTORY — DX: Other complications of anesthesia, initial encounter: T88.59XA

## 2011-09-29 HISTORY — DX: Malignant neoplasm of prostate: C61

## 2011-09-29 HISTORY — DX: Gastro-esophageal reflux disease without esophagitis: K21.9

## 2011-09-29 LAB — CARDIAC PANEL(CRET KIN+CKTOT+MB+TROPI)
CK, MB: 1.8 ng/mL (ref 0.3–4.0)
Relative Index: 1.1 (ref 0.0–2.5)
Total CK: 165 U/L (ref 7–232)
Troponin I: <0.3 ng/mL (ref <0.30)

## 2011-09-29 LAB — COMPREHENSIVE METABOLIC PANEL
ALT: 31 U/L (ref 0–53)
AST: 31 U/L (ref 0–37)
Albumin: 4.1 g/dL (ref 3.5–5.2)
CO2: 26 mEq/L (ref 19–32)
Calcium: 9 mg/dL (ref 8.4–10.5)
Chloride: 100 mEq/L (ref 96–112)
GFR calc non Af Amer: 74 mL/min — ABNORMAL LOW (ref >90)
Sodium: 136 mEq/L (ref 135–145)
Total Bilirubin: 1.5 mg/dL — ABNORMAL HIGH (ref 0.3–1.2)

## 2011-09-29 LAB — URINE MICROSCOPIC-ADD ON

## 2011-09-29 LAB — URINALYSIS, ROUTINE W REFLEX MICROSCOPIC
Glucose, UA: NEGATIVE mg/dL
Hgb urine dipstick: NEGATIVE
Protein, ur: 30 mg/dL — AB
pH: 6 (ref 5.0–8.0)

## 2011-09-29 LAB — CBC
Platelets: 167 10*3/uL (ref 150–400)
RBC: 4.65 MIL/uL (ref 4.22–5.81)
RDW: 13.5 % (ref 11.5–15.5)
WBC: 7.8 10*3/uL (ref 4.0–10.5)

## 2011-09-29 MED ORDER — SODIUM CHLORIDE 0.9 % IV BOLUS (SEPSIS)
1000.0000 mL | Freq: Once | INTRAVENOUS | Status: AC
  Administered 2011-09-29: 1000 mL via INTRAVENOUS

## 2011-09-29 MED ORDER — PROMETHAZINE HCL 25 MG/ML IJ SOLN
12.5000 mg | Freq: Once | INTRAMUSCULAR | Status: AC
  Administered 2011-09-29: 12.5 mg via INTRAVENOUS
  Filled 2011-09-29: qty 1

## 2011-09-29 MED ORDER — DEXTROSE 5 % IV SOLN
1.0000 g | INTRAVENOUS | Status: DC
  Filled 2011-09-29: qty 10

## 2011-09-29 MED ORDER — ONDANSETRON HCL 4 MG/2ML IJ SOLN: 4.0000 mg | Freq: Four times a day (QID) | INTRAMUSCULAR | Status: DC | PRN

## 2011-09-29 MED ORDER — ACETAMINOPHEN 650 MG RE SUPP: 650.0000 mg | Freq: Four times a day (QID) | RECTAL | Status: DC | PRN

## 2011-09-29 MED ORDER — PROMETHAZINE HCL 25 MG/ML IJ SOLN
12.5000 mg | Freq: Four times a day (QID) | INTRAMUSCULAR | Status: DC | PRN
  Administered 2011-09-29 (×2): 12.5 mg via INTRAVENOUS
  Filled 2011-09-29 (×2): qty 1

## 2011-09-29 MED ORDER — METOCLOPRAMIDE HCL 5 MG/ML IJ SOLN
10.0000 mg | Freq: Once | INTRAMUSCULAR | Status: AC
  Administered 2011-09-29: 10 mg via INTRAVENOUS
  Filled 2011-09-29: qty 2

## 2011-09-29 MED ORDER — ONDANSETRON HCL 4 MG/2ML IJ SOLN
4.0000 mg | Freq: Once | INTRAMUSCULAR | Status: AC
  Administered 2011-09-29: 4 mg via INTRAVENOUS
  Filled 2011-09-29: qty 2

## 2011-09-29 MED ORDER — SODIUM CHLORIDE 0.9 % IV SOLN
INTRAVENOUS | Status: DC
  Administered 2011-09-29 (×3): via INTRAVENOUS
  Administered 2011-09-29 – 2011-09-30 (×2): 1000 mL via INTRAVENOUS

## 2011-09-29 MED ORDER — ONDANSETRON HCL 4 MG PO TABS: 4.0000 mg | ORAL_TABLET | Freq: Four times a day (QID) | ORAL | Status: DC | PRN

## 2011-09-29 MED ORDER — ACETAMINOPHEN 325 MG PO TABS
650.0000 mg | ORAL_TABLET | Freq: Four times a day (QID) | ORAL | Status: DC | PRN
Start: 2011-09-29 — End: 2011-09-30
  Administered 2011-09-29 – 2011-09-30 (×2): 325 mg via ORAL
  Filled 2011-09-29: qty 2

## 2011-09-29 MED ORDER — SODIUM CHLORIDE 0.9 % IV SOLN
INTRAVENOUS | Status: DC
  Administered 2011-09-29: 05:00:00 via INTRAVENOUS

## 2011-09-29 MED ORDER — DEXTROSE 5 % IV SOLN
1.0000 g | INTRAVENOUS | Status: DC
  Administered 2011-09-30: 1 g via INTRAVENOUS
  Filled 2011-09-29: qty 10

## 2011-09-29 MED ORDER — PANTOPRAZOLE SODIUM 40 MG IV SOLR
40.0000 mg | Freq: Two times a day (BID) | INTRAVENOUS | Status: DC
  Administered 2011-09-29 – 2011-09-30 (×3): 40 mg via INTRAVENOUS
  Filled 2011-09-29 (×4): qty 40

## 2011-09-29 MED ORDER — DEXTROSE 5 % IV SOLN
1.0000 g | Freq: Once | INTRAVENOUS | Status: AC
  Administered 2011-09-29: 1 g via INTRAVENOUS
  Filled 2011-09-29: qty 10

## 2011-09-29 MED ORDER — ALBUTEROL SULFATE (5 MG/ML) 0.5% IN NEBU: 2.5000 mg | INHALATION_SOLUTION | RESPIRATORY_TRACT | Status: DC | PRN

## 2011-09-29 MED ORDER — ENOXAPARIN SODIUM 40 MG/0.4ML ~~LOC~~ SOLN
40.0000 mg | SUBCUTANEOUS | Status: DC
  Administered 2011-09-29: 40 mg via SUBCUTANEOUS
  Filled 2011-09-29 (×2): qty 0.4

## 2011-09-29 NOTE — ED Notes (Signed)
Paged md about wife wanting to talk to him. md called wife and explained that at this point pt should not be discharged, wife asked about ama, but pt is complaint with staying in the hospital. md called rn and explain pt does need to be hospitalized.

## 2011-09-29 NOTE — ED Notes (Signed)
Pt came out of room and states he is feeling worse and is now having chills

## 2011-09-29 NOTE — ED Notes (Signed)
Pt states he woke up this morning with nausea and this afternoon started having vomiting and developed diarrhea about 30 mins ago  Pt states he has only had a couple crackers today and has been sipping on some ginger ale  Pt states he feels dehydrated

## 2011-09-29 NOTE — H&P (Signed)
Christian Ward is an 61 y.o. male.    PCP: Michele Mcalpine, MD, MD   Chief Complaint: Nausea and vomiting since yesterday morning  HPI: This is a 61 year old, Caucasian male, with a past medical history of acid reflux, irritable bowel syndrome, prostate cancer, who was in his usual state of health till yesterday morning at 7 AM when he started having nausea. He mentions, that he ate out at the [employer] on Tuesday for lunch. He had a slice of home made pizza on Tuesday night for dinner. Subsequently, yesterday morning at 11:15 he took Zofran. He had some gagging. Felt extremely nauseous. He went home from work. At 3:45PM he took another half a tablet of Zofran and went to sleep. He woke up at 6 PM and felt very nauseous. He took another tablet of Prilosec after he had taken one that morning. But then subsequently, he had 3 large episodes of vomiting. Then he went to sleep, woke up again, at 11 PM had Zofran, but then subsequently, again had nausea and multiple episodes of vomiting and retching. He had one small episode of diarrhea as well. Denies any abdominal pain. Had some chills. Had a temperature 90.3F yesterday. Denies any dysuria. No back pains. Denies any shortness of breath or chest pain. No recent antibiotic use. No recent changes to his medication regimen.   Home Medications: Prior to Admission medications   Medication Sig Start Date End Date Taking? Authorizing Provider  ibuprofen (ADVIL,MOTRIN) 200 MG tablet Take 200 mg by mouth every 6 (six) hours as needed. For pain   Yes Historical Provider, MD  omeprazole (PRILOSEC) 20 MG capsule Take 20 mg by mouth daily.   Yes Historical Provider, MD  ondansetron (ZOFRAN) 4 MG tablet Take 4 mg by mouth every 8 (eight) hours as needed. For nausea   Yes Historical Provider, MD    Allergies: No Known Allergies  Past Medical History: Past Medical History  Diagnosis Date  . Acid reflux   . Cancer   . Prostate cancer     Past Surgical  History  Procedure Date  . Cholecystectomy   . Prostatectomy   . Appendectomy     Social History:  reports that he has never smoked. He does not have any smokeless tobacco history on file. He reports that he drinks alcohol. He reports that he does not use illicit drugs.  Family History:  Family History  Problem Relation Age of Onset  . Hypertension Father   . Cancer Father   . Stroke Father   . COPD Father   . Coronary artery disease Father   . Lung disease Father   . Heart disease Father   . Lung disease Mother     Review of Systems - History obtained from the patient General ROS: positive for  - fatigue Psychological ROS: negative Ophthalmic ROS: negative ENT ROS: negative Allergy and Immunology ROS: negative Hematological and Lymphatic ROS: negative Endocrine ROS: negative Respiratory ROS: no cough, shortness of breath, or wheezing Cardiovascular ROS: no chest pain or dyspnea on exertion Gastrointestinal ROS: as in hpi Genito-Urinary ROS: no dysuria, trouble voiding, or hematuria Musculoskeletal ROS: negative Neurological ROS: no TIA or stroke symptoms Dermatological ROS: negative  Physical Examination Blood pressure 112/66, pulse 74, temperature 98.4 F (36.9 C), temperature source Oral, resp. rate 18, SpO2 97.00%.  General appearance: alert, cooperative, appears stated age and no distress Head: Normocephalic, without obvious abnormality, atraumatic Eyes: conjunctivae/corneas clear. PERRL, EOM's intact.  Throat: lips, mucosa,  and tongue normal; teeth and gums normal Neck: no adenopathy, no carotid bruit, no JVD, supple, symmetrical, trachea midline and thyroid not enlarged, symmetric, no tenderness/mass/nodules Back: symmetric, no curvature. ROM normal. No CVA tenderness. Resp: clear to auscultation bilaterally Cardio: regular rate and rhythm, S1, S2 normal, no murmur, click, rub or gallop GI: soft, mildly tender in epigastric area; bowel sounds normal; no  masses,  no organomegaly Extremities: extremities normal, atraumatic, no cyanosis or edema Pulses: 2+ and symmetric Skin: Skin color, texture, turgor normal. No rashes or lesions Lymph nodes: Cervical, supraclavicular, and axillary nodes normal. Neurologic: Alert and oriented X 3, normal strength and tone. Normal symmetric reflexes. Normal coordination and gait  Laboratory Data: Results for orders placed during the hospital encounter of 09/29/11 (from the past 48 hour(s))  CBC     Status: Normal   Collection Time   09/29/11  2:58 AM      Component Value Range Comment   WBC 7.8  4.0 - 10.5 (K/uL)    RBC 4.65  4.22 - 5.81 (MIL/uL)    Hemoglobin 14.3  13.0 - 17.0 (g/dL)    HCT 16.1  09.6 - 04.5 (%)    MCV 89.7  78.0 - 100.0 (fL)    MCH 30.8  26.0 - 34.0 (pg)    MCHC 34.3  30.0 - 36.0 (g/dL)    RDW 40.9  81.1 - 91.4 (%)    Platelets 167  150 - 400 (K/uL)   COMPREHENSIVE METABOLIC PANEL     Status: Abnormal   Collection Time   09/29/11  2:58 AM      Component Value Range Comment   Sodium 136  135 - 145 (mEq/L)    Potassium 3.8  3.5 - 5.1 (mEq/L)    Chloride 100  96 - 112 (mEq/L)    CO2 26  19 - 32 (mEq/L)    Glucose, Bld 120 (*) 70 - 99 (mg/dL)    BUN 18  6 - 23 (mg/dL)    Creatinine, Ser 7.82  0.50 - 1.35 (mg/dL)    Calcium 9.0  8.4 - 10.5 (mg/dL)    Total Protein 7.5  6.0 - 8.3 (g/dL)    Albumin 4.1  3.5 - 5.2 (g/dL)    AST 31  0 - 37 (U/L)    ALT 31  0 - 53 (U/L)    Alkaline Phosphatase 81  39 - 117 (U/L)    Total Bilirubin 1.5 (*) 0.3 - 1.2 (mg/dL)    GFR calc non Af Amer 74 (*) >90 (mL/min)    GFR calc Af Amer 86 (*) >90 (mL/min)   LIPASE, BLOOD     Status: Normal   Collection Time   09/29/11  2:58 AM      Component Value Range Comment   Lipase 20  11 - 59 (U/L)   URINALYSIS, ROUTINE W REFLEX MICROSCOPIC     Status: Abnormal   Collection Time   09/29/11  3:29 AM      Component Value Range Comment   Color, Urine ORANGE (*) YELLOW  BIOCHEMICALS MAY BE AFFECTED BY COLOR    APPearance CLOUDY (*) CLEAR     Specific Gravity, Urine 1.035 (*) 1.005 - 1.030     pH 6.0  5.0 - 8.0     Glucose, UA NEGATIVE  NEGATIVE (mg/dL)    Hgb urine dipstick NEGATIVE  NEGATIVE     Bilirubin Urine NEGATIVE  NEGATIVE     Ketones, ur 40 (*) NEGATIVE (mg/dL)  Protein, ur 30 (*) NEGATIVE (mg/dL)    Urobilinogen, UA 1.0  0.0 - 1.0 (mg/dL)    Nitrite POSITIVE (*) NEGATIVE     Leukocytes, UA SMALL (*) NEGATIVE    URINE MICROSCOPIC-ADD ON     Status: Abnormal   Collection Time   09/29/11  3:29 AM      Component Value Range Comment   Squamous Epithelial / LPF RARE  RARE     WBC, UA 7-10  <3 (WBC/hpf)    RBC / HPF 0-2  <3 (RBC/hpf)    Bacteria, UA MANY (*) RARE     Urine-Other MUCOUS PRESENT       Radiology Reports: Dg Abd Acute W/chest  09/29/2011  *RADIOLOGY REPORT*  Clinical Data: Nausea and vomiting.  ACUTE ABDOMEN SERIES (ABDOMEN 2 VIEW & CHEST 1 VIEW)  Comparison: Chest x-ray dated 04/06/2009  Findings: The heart and lungs appear normal.  No free air or free fluid in the abdomen.  Minimal amount of air scattered throughout the bowel.  No dilated loops of bowel.  Evidence of prior cholecystectomy.  No osseous abnormality.  IMPRESSION: Benign-appearing abdomen and chest.  Original Report Authenticated By: Gwynn Burly, M.D.    Electrocardiogram: Pending  Assessment/Plan  Principal Problem:  *Nausea and vomiting Active Problems:  Irritable bowel syndrome  UTI (urinary tract infection)  History of prostate cancer  GERD (gastroesophageal reflux disease)   #1 nausea and vomiting: This is probably related to the urinary tract infection. He'll be given symptomatic treatments. He'll be kept n.p.o. IV fluids will be provided. He'll be observed in the hospital.  #2. Urinary tract infection/Possible Pyelonephritis: Urine cultures will be followed up on. Ceftriaxone will be continued. Because of his history of prostate cancer and the fact, that he is a male and has a  significantly abnormal UA, we will proceed with an ultrasound of his genitourinary system.  #3. Mild hyperbilirubinemia: Fractionation will be done. Ultrasound will be followed up on. Rest of his LFTs are normal. He is status post cholecystectomy.  #4 history of GERD: PPI will be continued.  He is a full code. DVT prophylaxis will be utilized.  Further management decisions will depend on results of further testing and patient's response to treatment.  [facility]  [facility] Pager [phone]  09/29/2011, 7:42 AM

## 2011-09-29 NOTE — ED Provider Notes (Signed)
History     CSN: [id number]  Arrival date & time 09/29/11  4098   First MD Initiated Contact with Patient 09/29/11 0244      Chief Complaint  Patient presents with  . Nausea  . Emesis  . Diarrhea    (Consider location/radiation/quality/duration/timing/severity/associated sxs/prior treatment) HPI Hx provided by patient. In otherwise normal state of health. This morning woke up with nausea and multiple rounds of emesis. He took Zofran that he had a home with intermittent relief. End up taking 3 tabs throughout the day with persistent vomiting tonight. He has had some lower abdominal discomfort but denies any significant pain. 1 loose bowel movement. No blood in stools or emesis. Has history of cholecystectomy and prostatectomy 2 years ago. Feels dehydrated. Moderate severity. Has persistent nausea on my evaluation. No chest pain or difficulty breathing. No rashes. No recent antibiotics. No recent travel. Past Medical History  Diagnosis Date  . Acid reflux   . Cancer   . Prostate cancer     Past Surgical History  Procedure Date  . Cholecystectomy   . Prostatectomy     Family History  Problem Relation Age of Onset  . Hypertension Father   . Cancer Father   . Stroke Father   . COPD Father   . Coronary artery disease Father     History  Substance Use Topics  . Smoking status: Never Smoker   . Smokeless tobacco: Not on file  . Alcohol Use: Yes     wekend      Review of Systems  Constitutional: Negative for fever and chills.  HENT: Negative for neck pain and neck stiffness.   Eyes: Negative for pain.  Respiratory: Negative for shortness of breath.   Cardiovascular: Negative for chest pain.  Gastrointestinal: Positive for nausea and vomiting. Negative for abdominal distention.  Genitourinary: Negative for dysuria.  Musculoskeletal: Negative for back pain.  Skin: Negative for rash.  Neurological: Negative for headaches.  All other systems reviewed and are  negative.    Allergies  Review of patient's allergies indicates no known allergies.  Home Medications   Current Outpatient Rx  Name Route Sig Dispense Refill  . IBUPROFEN 200 MG PO TABS Oral Take 200 mg by mouth every 6 (six) hours as needed. For pain    . OMEPRAZOLE 20 MG PO CPDR Oral Take 20 mg by mouth daily.    [name] ONDANSETRON HCL 4 MG PO TABS Oral Take 4 mg by mouth every 8 (eight) hours as needed. For nausea      BP 120/60  Pulse 78  Temp(Src) 98.4 F (36.9 C) (Oral)  Resp 20  SpO2 100%  Physical Exam  Constitutional: He is oriented to person, place, and time. He appears well-developed and well-nourished.  HENT:  Head: Normocephalic and atraumatic.       Dry mm  Eyes: Conjunctivae and EOM are normal. Pupils are equal, round, and reactive to light.  Neck: Trachea normal. Neck supple. No thyromegaly present.  Cardiovascular: Normal rate, regular rhythm, S1 normal, S2 normal and normal pulses.     No systolic murmur is present   No diastolic murmur is present  Pulses:      Radial pulses are 2+ on the right side, and 2+ on the left side.  Pulmonary/Chest: Effort normal and breath sounds normal. He has no wheezes. He has no rhonchi. He has no rales.  Abdominal: Soft. Normal appearance and bowel sounds are normal. He exhibits no mass. There is no tenderness.  There is no rebound, no guarding, no CVA tenderness and negative Murphy's sign.  Musculoskeletal:       BLE:s Calves nontender, no cords or erythema, negative Homans sign  Neurological: He is alert and oriented to person, place, and time. He has normal strength. No cranial nerve deficit or sensory deficit. GCS eye subscore is 4. GCS verbal subscore is 5. GCS motor subscore is 6.  Skin: Skin is warm and dry. No rash noted. He is not diaphoretic.  Psychiatric: His speech is normal.       Cooperative and appropriate    ED Course  Procedures (including critical care time)  Labs Reviewed  COMPREHENSIVE METABOLIC PANEL -  Abnormal; Notable for the following:    Glucose, Bld 120 (*)    Total Bilirubin 1.5 (*)    GFR calc non Af Amer 74 (*)    GFR calc Af Amer 86 (*)    All other components within normal limits  URINALYSIS, ROUTINE W REFLEX MICROSCOPIC - Abnormal; Notable for the following:    Color, Urine ORANGE (*) BIOCHEMICALS MAY BE AFFECTED BY COLOR   APPearance CLOUDY (*)    Specific Gravity, Urine 1.035 (*)    Ketones, ur 40 (*)    Protein, ur 30 (*)    Nitrite POSITIVE (*)    Leukocytes, UA SMALL (*)    All other components within normal limits  URINE MICROSCOPIC-ADD ON - Abnormal; Notable for the following:    Bacteria, UA MANY (*)    All other components within normal limits  CBC  LIPASE, BLOOD  URINE CULTURE     1. Persistent vomiting   2. UTI (lower urinary tract infection)       MDM   IVFs, IV Zofran. Serial evaluations with persistent nausea. IV Reglan provided. UA reviewed and IV Rocephin initiated. Recheck at 6 AM still not doing better. CVA tenderness or fevers or clinical pyelo. Medicine consult for admission. Case discussed with Dr.Kakrakandy who recs xray and will admit        Sunnie Nielsen, MD 09/29/11 [phone]

## 2011-09-29 NOTE — ED Notes (Signed)
Arline Asp (pt wife)  [phone]

## 2011-09-29 NOTE — ED Notes (Signed)
rn has paged admitting dr about second dose of rocephin and if he wants rn to administer it so close to the first dose. rn still waiting for a response from md.

## 2011-09-29 NOTE — ED Notes (Signed)
Patient transported to X-ray 

## 2011-09-29 NOTE — ED Notes (Signed)
Pt to ultrasound.  Pt wife called pts urologist to alert him pt was in the ED and being admitted for observation.

## 2011-09-29 NOTE — ED Notes (Signed)
Report given to cindy, rn on floor 

## 2011-09-30 DIAGNOSIS — E782 Mixed hyperlipidemia: Secondary | ICD-10-CM

## 2011-09-30 DIAGNOSIS — R112 Nausea with vomiting, unspecified: Secondary | ICD-10-CM

## 2011-09-30 LAB — COMPREHENSIVE METABOLIC PANEL
ALT: 26 U/L (ref 0–53)
AST: 26 U/L (ref 0–37)
Albumin: 3.2 g/dL — ABNORMAL LOW (ref 3.5–5.2)
Calcium: 8.1 mg/dL — ABNORMAL LOW (ref 8.4–10.5)
Sodium: 140 mEq/L (ref 135–145)
Total Protein: 5.9 g/dL — ABNORMAL LOW (ref 6.0–8.3)

## 2011-09-30 LAB — CBC
HCT: 36.3 % — ABNORMAL LOW (ref 39.0–52.0)
Hemoglobin: 12 g/dL — ABNORMAL LOW (ref 13.0–17.0)
MCH: 29.9 pg (ref 26.0–34.0)
MCHC: 33.1 g/dL (ref 30.0–36.0)
RBC: 4.01 MIL/uL — ABNORMAL LOW (ref 4.22–5.81)

## 2011-09-30 LAB — URINE CULTURE
Colony Count: 2000
Culture  Setup Time: 201305230946

## 2011-09-30 MED ORDER — LEVOFLOXACIN 500 MG PO TABS: 500.0000 mg | ORAL_TABLET | Freq: Once | ORAL | Status: AC

## 2011-09-30 MED ORDER — PROMETHAZINE HCL 12.5 MG PO TABS: 12.5000 mg | ORAL_TABLET | Freq: Four times a day (QID) | ORAL | Status: DC | PRN

## 2011-09-30 MED ORDER — POTASSIUM CHLORIDE ER 10 MEQ PO TBCR: 20.0000 meq | EXTENDED_RELEASE_TABLET | Freq: Two times a day (BID) | ORAL | Status: DC

## 2011-09-30 MED ORDER — BIOTENE DRY MOUTH MT LIQD
15.0000 mL | Freq: Two times a day (BID) | OROMUCOSAL | Status: DC
  Administered 2011-09-30: 15 mL via OROMUCOSAL

## 2011-09-30 MED ORDER — POTASSIUM CHLORIDE 10 MEQ/100ML IV SOLN
10.0000 meq | INTRAVENOUS | Status: AC
  Administered 2011-09-30: 10 meq via INTRAVENOUS
  Filled 2011-09-30 (×3): qty 100

## 2011-09-30 MED ORDER — POTASSIUM CHLORIDE CRYS ER 20 MEQ PO TBCR
60.0000 meq | EXTENDED_RELEASE_TABLET | Freq: Once | ORAL | Status: AC
  Administered 2011-09-30: 60 meq via ORAL
  Filled 2011-09-30: qty 3

## 2011-09-30 MED ORDER — LEVOFLOXACIN 500 MG PO TABS
500.0000 mg | ORAL_TABLET | Freq: Once | ORAL | Status: AC
  Administered 2011-09-30: 500 mg via ORAL
  Filled 2011-09-30: qty 1

## 2011-09-30 NOTE — Discharge Summary (Addendum)
Physician Discharge Summary  Patient ID: Christian Ward MRN: [id number] DOB/AGE: Apr 20, 1951 61 y.o.  Admit date: 09/29/2011 Discharge date: 09/30/2011  PCP: Michele Mcalpine, MD, MD  Discharge Diagnoses:  Active Problems:  Irritable bowel syndrome  UTI (urinary tract infection)  History of prostate cancer  GERD (gastroesophageal reflux disease)   Discharged Condition: fair  Initial History: This is a 62 year old, Caucasian male, with a past medical history of acid reflux, irritable bowel syndrome, prostate cancer, who was in his usual state of health till day before admission at 7 AM when he started having nausea. He mentioned, that he ate out at the [employer] on Tuesday for lunch. He had a slice of home made pizza on Tuesday night for dinner. Subsequently, on Wednesday morning at 11:15 he took Zofran. He had some gagging. Felt extremely nauseous. He went home from work. At 3:45PM he took another half a tablet of Zofran and went to sleep. He woke up at 6 PM and felt very nauseous. He took another tablet of Prilosec after he had taken one that morning. But then subsequently, he had 3 large episodes of vomiting. Then he went to sleep, woke up again, at 11 PM had Zofran, but then subsequently, again had nausea and multiple episodes of vomiting and retching. He had one small episode of diarrhea as well. Denies any abdominal pain. Had some chills. Had a temperature 99.42F on Wednesday. Denies any dysuria. No back pains. Denies any shortness of breath or chest pain. No recent antibiotic use. No recent changes to his medication regimen.  Hospital Course:   #1 nausea and vomiting: This is probably related to the urinary tract infection. With symptomatic treatment he has improved. Has been tolerating liquid diet. No nausea since last night. No pain. 2 loose stools.   #2. Urinary tract infection/Possible Pyelonephritis: Urine cultures are pending. Patient is afebrile. Will discharge him on Levaquin.  He was initially given Ceftriaxone. Because of his history of prostate cancer and the fact, that he is a male and has a significantly abnormal UA, we got a renal ultrasound which was unremarkable. He was asked to follow up with his urologist in a month.  #3. Mild hyperbilirubinemia: Repeat level was normal. [place] did not show any significant abnormality except for hepatic steatosis. Rest of his LFTs are normal. He is status post cholecystectomy.   #4 history of GERD: PPI should be continued.  #5 Hypokalemia: Will be repleted orally.  PERTINENT LABS  Urine culture is pending  IMAGING STUDIES US Abdomen Complete  09/29/2011  *RADIOLOGY REPORT*  Clinical Data:  Pyelonephritis, nausea and vomiting, mild hydronephrosis.  COMPLETE ABDOMINAL ULTRASOUND  Comparison:  Ultrasound 07/26/2005  Findings:  Gallbladder:  Surgically absent  Common bile duct:  Normal 4 mm.  Liver:  Diffuse increased parenchymal echogenicity.  No duct dilatation.  IVC:  Appears normal.  Pancreas:  No focal abnormality seen.  Spleen:  Normal size and echogenicity.  Right Kidney:  12.1cm in length.  No evidence of hydronephrosis or stones.  Left Kidney:  12.1cm in length.  No evidence of hydronephrosis or stones.  Abdominal aorta:  No aneurysm identified.  IMPRESSION:  1.  Increased liver echogenicity commonly represents hepatic steatosis. 2.  Prior cholecystectomy.  3.  No hydronephrosis.  Original Report Authenticated By: Genevive Bi, M.D.   Dg Abd Acute W/chest  09/29/2011  *RADIOLOGY REPORT*  Clinical Data: Nausea and vomiting.  ACUTE ABDOMEN SERIES (ABDOMEN 2 VIEW & CHEST 1 VIEW)  Comparison: Chest x-ray  dated 04/06/2009  Findings: The heart and lungs appear normal.  No free air or free fluid in the abdomen.  Minimal amount of air scattered throughout the bowel.  No dilated loops of bowel.  Evidence of prior cholecystectomy.  No osseous abnormality.  IMPRESSION: Benign-appearing abdomen and chest.  Original Report Authenticated  By: Gwynn Burly, M.D.    Discharge Exam: Blood pressure 131/71, pulse 70, temperature 98.4 F (36.9 C), temperature source Oral, resp. rate 16, height 6\' 4"  (1.93 m), weight 99.791 kg (220 lb), SpO2 95.00%. General appearance: alert, cooperative, appears stated age and no distress Head: Normocephalic, without obvious abnormality, atraumatic Resp: clear to auscultation bilaterally Cardio: regular rate and rhythm, S1, S2 normal, no murmur, click, rub or gallop GI: soft, non-tender; bowel sounds normal; no masses,  no organomegaly Extremities: extremities normal, atraumatic, no cyanosis or edema Neurologic: Grossly normal  Disposition: Home  Discharge Orders    Future Orders Please Complete By Expires   Increase activity slowly      Discharge instructions      Comments:   Please follow up with your Urologist within 1 month   Discharge diet:      Comments:   Liquids for today and soft/bland diet from tomorrow     Current Discharge Medication List    START taking these medications   Details  levofloxacin (LEVAQUIN) 500 MG tablet Take 1 tablet (500 mg total) by mouth once. Starting tomorrow for 7 days Qty: 7 tablet, Refills: 0    potassium chloride (K-DUR) 10 MEQ tablet Take 2 tablets (20 mEq total) by mouth 2 (two) times daily. For 2 days, starting tonight Qty: 8 tablet, Refills: 0    promethazine (PHENERGAN) 12.5 MG tablet Take 1 tablet (12.5 mg total) by mouth every 6 (six) hours as needed for nausea. Qty: 15 tablet, Refills: 0      CONTINUE these medications which have NOT CHANGED   Details  ibuprofen (ADVIL,MOTRIN) 200 MG tablet Take 200 mg by mouth every 6 (six) hours as needed. For pain    omeprazole (PRILOSEC) 20 MG capsule Take 20 mg by mouth daily.    ondansetron (ZOFRAN) 4 MG tablet Take 4 mg by mouth every 8 (eight) hours as needed. For nausea       Follow-up Information    Follow up with NADEL,SCOTT M, MD. Schedule an appointment as soon as possible for  a visit in 1 week. (post hospitalization follow up)    Contact information:   [facility] [address] [place] [address] [phone]       Follow up with DAVIS III, RONALD L, MD. Schedule an appointment as soon as possible for a visit in 1 month.   Contact information:   [address] [place] [address] [phone]          Total Discharge Time: 35 mins  [facility]  [facility] Pager [phone]  09/30/2011, 2:09 PM   Spoke to patient on 10/04/11 after he had left a message over the weekend.  He apparently couldn't tolerate the Levaquin and stopped taking it. He spoke to one of the physicians covering for his PCP and was asked to hold off on antibiotics due to negative culture results. I agreed with it and asked him to follow up with his PCP if he has further issues.  Brandan Glauber 10/04/2011 2:01 PM

## 2011-09-30 NOTE — Progress Notes (Addendum)
Patient's K+ 2.9 this am. Paged NP. Awaiting orders.   0630--NP ordered 3 "runs of K+"

## 2011-10-02 ENCOUNTER — Telehealth: Payer: Self-pay | Admitting: Pulmonary Disease

## 2011-10-02 NOTE — Telephone Encounter (Signed)
Wednesday nausea, vomiting and diarrhea.  Went to ED.  Given Zofran / Phenergan with some improvement.  Diagnosed with UTI.  Admitted briefly, started on Ceftriaxone.  Discharged Friday on Levaquin.  Still nauseated, but better, able to take food.  Concerned if nausea now is related to Levaquin, so stopped taking it (completed 3 days including IV).  Was able to tolerate Cipro before, without issues.  Records reviewed.  WBC / Nitrate in UA.  WBC normal. K 2.9.  Cultures negative.  Renal US negative.  Denies dysuria, pyuria, frequency, urgency, back pain or fever.  Advised to call office when opens, take fluids, continue K supplement.  OK to hold ABx at this time.  Will call if symptoms worsen or new symptoms.

## 2012-01-17 ENCOUNTER — Telehealth: Payer: Self-pay | Admitting: Pulmonary Disease

## 2012-01-17 ENCOUNTER — Encounter: Payer: Self-pay | Admitting: Adult Health

## 2012-01-17 ENCOUNTER — Other Ambulatory Visit (INDEPENDENT_AMBULATORY_CARE_PROVIDER_SITE_OTHER): Payer: BC Managed Care – PPO

## 2012-01-17 ENCOUNTER — Ambulatory Visit (INDEPENDENT_AMBULATORY_CARE_PROVIDER_SITE_OTHER): Payer: BC Managed Care – PPO | Admitting: Adult Health

## 2012-01-17 VITALS — BP 104/68 | HR 71 | Temp 97.1°F | Ht 76.0 in | Wt 217.4 lb

## 2012-01-17 DIAGNOSIS — R197 Diarrhea, unspecified: Secondary | ICD-10-CM | POA: Insufficient documentation

## 2012-01-17 LAB — CBC WITH DIFFERENTIAL/PLATELET
Basophils Absolute: 0 10*3/uL (ref 0.0–0.1)
Eosinophils Absolute: 0.1 10*3/uL (ref 0.0–0.7)
HCT: 39.6 % (ref 39.0–52.0)
Hemoglobin: 13.2 g/dL (ref 13.0–17.0)
Lymphs Abs: 1 10*3/uL (ref 0.7–4.0)
MCHC: 33.2 g/dL (ref 30.0–36.0)
Neutro Abs: 5.7 10*3/uL (ref 1.4–7.7)
Platelets: 173 10*3/uL (ref 150.0–400.0)
RDW: 13.3 % (ref 11.5–14.6)

## 2012-01-17 NOTE — Progress Notes (Signed)
Subjective:    Patient ID: Christian Ward, male    DOB: [birth year], 61 y.o.   MRN: [id number]  HPI 61  y/o WM     ~ April 06, 2009: he tells me that he has decided to proceed w/ definitive treatment for his prostate cancer, and that he is leaning towards robotic surgery... as noted below he has been on a watchful waiting protocol w/ biopsies every year or so & the disease is progressing... he inquired about a new radiation technique at [facility] but it requires him to remain in [place] for 7-8 weeks... he is due to f/u w/ DrRDavis soon... his CC includes some reflux, alteration in bowel habits, and nausea symptoms for which he would like a referral to [facility]- we discussed Rx w/ Prilosec OTC & incr fiber/ Metamucil for his IBS-like symptoms...   01/17/2012 Acute OV  Complains of  loose stools x3 days w/ bloating/increased gas.   Denies body aches, f/c/s, n/v. No urinary symptoms .  No bloody stools , no abdominal pain  No recent abx use.  Used some immodium without much help.  On avg 2-4 stools day .   Underwent  prostatectomy 2011 for prostate cancer  Going out of town in 2 days for vacation.        Problem List:  BRONCHITIS, RECURRENT (ICD-491.9) - hx AB in the past but no recent problems w/ cough, congestion, etc...   HYPERCHOLESTEROLEMIA, MILD (ICD-272.0) - on diet Rx alone...  ~ FLP 2/08 showed TChol 168, TG 68, HDL 38, LDL 117... he prefers diet + exercise Rx.  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   S/P CHOLECYSTECTOMY - he [facility]...   ADENOCARCINOMA, PROSTATE (ICD-185) - Dx 9/06 by [facility] w/ prostate bx after rising PSA; Pt opted for Watchful Waiting after consults w/ Kristopher Glee, & [facility] DrPolascik- considered cryotherapy but opted for watchful waiting...  ~ 3/10: he had f/u visit w/ DrMurray to discuss XRT...  ~  11/10: pt tells me that he has decided to proceed w/ surg in the spring...   SHOULDER PAIN, LEFT (ICD-719.41) - hx left shoulder pain w/ ortho eval by DrNorris 3/10- ?small tear & he was given injection + phys therapy...   INFECTIOUS MONONUCLEOSIS, HX OF (ICD-V12.09)   SHINGLES, HX OF (ICD-V13.8)   Hx of CLAUSTROPHOBIA      Review of Systems Constitutional:   No  weight loss, night sweats,  Fevers, chills, fatigue, or  lassitude.  HEENT:   No headaches,  Difficulty swallowing,  Tooth/dental problems, or  Sore throat,                No sneezing, itching, ear ache, nasal congestion, post nasal drip,   CV:  No chest pain,  Orthopnea, PND, swelling in lower extremities, anasarca, dizziness, palpitations, syncope.   GI  No heartburn, indigestion, abdominal pain, nausea, vomiting,   loss of appetite, bloody stools.   Resp: No shortness of breath with exertion or at rest.  No excess mucus, no productive cough,  No non-productive cough,  No coughing up of blood.  No change in color of mucus.  No wheezing.  No chest wall deformity  Skin: no rash or lesions.  GU: no dysuria, change in color of urine, no urgency or frequency.  No flank pain, no hematuria   MS:  No joint pain or swelling.  No decreased range of motion.  No back pain.  Psych:  No change in mood or affect. No depression or anxiety.  No memory loss.         Objective:   Physical Exam GEN: A/Ox3; pleasant , NAD, well nourished   HEENT:  [place]/AT,  EACs-clear, TMs-wnl, NOSE-clear, THROAT-clear, no lesions, no postnasal drip or exudate noted.   NECK:  Supple w/ fair ROM; no JVD; normal carotid impulses w/o bruits; no thyromegaly or nodules palpated; no lymphadenopathy.  RESP  Clear  P & A; w/o, wheezes/ rales/ or rhonchi.no accessory muscle use, no dullness to percussion  CARD:  RRR, no m/r/g  , no peripheral edema, pulses intact, no cyanosis or clubbing.  GI:   Soft & nt; nml bowel sounds; no organomegaly or masses  detected.  Musco: Warm bil, no deformities or joint swelling noted.   Neuro: alert, no focal deficits noted.    Skin: Warm, no lesions or rashes         Assessment & Plan:

## 2012-01-17 NOTE — Assessment & Plan Note (Signed)
Diarrhea -suspect viral illness that will be self limiting. No recent exposure to abx . Exam unrevealing.   Labs pending with cbc /lfts/bmet  Encouraged bland diet as tolerated.  Gas x As needed   Needs cpx in 1-2 months and As needed   Please contact office for sooner follow up if symptoms do not improve or worsen or seek emergency care

## 2012-01-17 NOTE — Telephone Encounter (Signed)
Made in error. Christian Ward  °

## 2012-01-17 NOTE — Patient Instructions (Addendum)
Advance bland diet as tolerated.  Gas x As needed   Push fluids , ie -propel  Avoid lactose x 3 days  Please contact office for sooner follow up if symptoms do not improve or worsen or seek emergency care  follow up with Dr. Kriste Basque  For physical in 1-2 months

## 2012-01-18 ENCOUNTER — Telehealth: Payer: Self-pay | Admitting: Adult Health

## 2012-01-18 LAB — BASIC METABOLIC PANEL
Calcium: 8.9 mg/dL (ref 8.4–10.5)
Creatinine, Ser: 1.1 mg/dL (ref 0.4–1.5)
GFR: 73.21 mL/min (ref >60.00)

## 2012-01-18 LAB — HEPATIC FUNCTION PANEL
Bilirubin, Direct: 0.2 mg/dL (ref 0.0–0.3)
Total Bilirubin: 0.8 mg/dL (ref 0.3–1.2)

## 2012-01-18 NOTE — Telephone Encounter (Signed)
Called and spoke with patient, informed him of results/recs as listed below per TP.  Patient verbalized understanding and nothing further needed at this time.  Notes Recorded by Julio Sicks, NP on 01/18/2012 at 12:19 PM Labs look okay  Nothing to explain symptoms  Please contact office for sooner follow up if symptoms do not improve or worsen or seek emergency care

## 2012-01-18 NOTE — Progress Notes (Signed)
Quick Note:  Called and spoke with patient, informed him of results/recs as listed below per TP. Patient verbalized understanding and nothing further needed at this time. ______

## 2012-02-13 ENCOUNTER — Encounter: Payer: Self-pay | Admitting: *Deleted

## 2012-02-14 ENCOUNTER — Encounter: Payer: Self-pay | Admitting: Pulmonary Disease

## 2012-02-14 ENCOUNTER — Ambulatory Visit (INDEPENDENT_AMBULATORY_CARE_PROVIDER_SITE_OTHER): Payer: BC Managed Care – PPO | Admitting: Pulmonary Disease

## 2012-02-14 VITALS — BP 120/74 | HR 60 | Temp 99.1°F | Ht 76.0 in | Wt 213.8 lb

## 2012-02-14 DIAGNOSIS — K589 Irritable bowel syndrome without diarrhea: Secondary | ICD-10-CM

## 2012-02-14 DIAGNOSIS — C61 Malignant neoplasm of prostate: Secondary | ICD-10-CM

## 2012-02-14 DIAGNOSIS — Z0000 Encounter for general adult medical examination without abnormal findings: Secondary | ICD-10-CM

## 2012-02-14 DIAGNOSIS — K219 Gastro-esophageal reflux disease without esophagitis: Secondary | ICD-10-CM

## 2012-02-14 NOTE — Patient Instructions (Addendum)
Today we updated your med list in our EPIC system...    Continue your current medications the same...  Please return to our lab one morning this week for your FASTING blood work...    We will call you w/ the results...  Call for any problems or if we can be of service in any way.Marland KitchenMarland Kitchen

## 2012-02-14 NOTE — Progress Notes (Addendum)
Subjective:     Patient ID: Christian Ward, male   DOB: [birth year], 61 y.o.   MRN: [id number]  HPI 61 y/o WM here for a follow up visit and CPX... last seen 12/08 before we started EMR...  ~  April 06, 2009:  he tells me that he has decided to proceed w/ definitive treatment for his prostate cancer, and that he is leaning towards robotic surgery... as noted below he has been on a watchful waiting protocol w/ biopsies every year or so & the disease is progressing... he inquired about a new radiation technique at [facility] but it requires him to remain in [place] for 7-8 weeks... he is due to f/u w/ DrRDavis soon...  his CC includes some reflux, alteration in bowel habits, and nausea symptoms for which he would like a referral to [facility]- we discussed Rx w/ Prilosec OTC & incr fiber/ Metamucil for his IBS-like symptoms...  ~  February 14, 2012:  68yr ROV & CPX> after I saw him last, in 2010, [name] decided on Robotic-assisted laparoscopic radical prostatectomy w/ bilat pelvic lymphadenectomy from [facility], done 3/11; he reports signif ED post surg & he continues to f/u w/ DrDavis every 109mo for re-eval & PSAs; he tells me that his PSA was ?0.01(or 0.1) and increased to 0.02(or 0.2) on last check- we do not have records from Urology; he says that Delorise Royals is going to consider some new med if it rises any further...     Jimel has also followed up regularly w/ [facility] for GI> he reports an EGD w/ erosive esophagitis & has been well maintained on Prilosec 20mg  once or twice daily, he will need this long term & DrMedoff wanted him to have BMD but he wants to hold off for now (may be able to switch to H2blockers); he is due for a follow up colonoscopy soon & they plan f/u EGD at the same time...     He was hosp 5/13 w/ ?UTI, later felt to be "an intestinal virus"; he had n/v, dehydrated, given IV fluids and phenergan which helped; all symptoms resolved w/o recurrence....    He also notes intermittent mild LBP on  the left side; prev seen by Chiropractor but little need since he got an incline board (inversion table) which he uses once a week or so; thinks he needs a new mattress; he will consider Ortho eval if the back discomfort increases...     We reviewed prob list, meds, xrays and labs> see below for updates >> he declines flu vaccine... CXR 5/13 showed normal heart size, clear lungs...  EKG 5/13 showed NSR, rate67, borderline IVCD, otherw wnl... Abd Ultrasound 5/13: s/p GB, diffuse incr hepatic echogenicity c/w hepatic steatosis, otherw neg... LABS 5/13:  Chems- wnl x K=2.9, repleted;  LFTs were wnl;  CBC- wnl;   LABS 9/13:  Chems- wnl x BS=114, K=3.9;  LFTs remain wnl;  CBC- wnl;   He will ret for FLP (wnl x HDL=32) & TSH (wnl at 1.84);  PSAs done by Urology...  ADDENDUM>> 10/16/12>> Fread tells me he had incr daily LBP & went to see DrGioffre w/ XRays & MRI done (?results- not in Epic); Sent here for extensive lab work requested by [employer]- all looks wnl (CBC, Chems, SPE, Urine for BJP, Sed/ CRP, Uric, ANA).[name] He tells me that Celebrex helped a little but Pred has helped a lot & he has f/u w/ Ortho soon...  Problem List:    BRONCHITIS, RECURRENT (ICD-491.9) - hx  AB in the past but no recent problems w/ cough, congestion, etc...   HYPERCHOLESTEROLEMIA, MILD (ICD-272.0) - on diet Rx alone... ~  FLP 2/08 showed TChol 168, TG 68, HDL 38, LDL 117... he prefers diet + exercise Rx. ~  FLP 11/10 showed TChol 142, TG 105, HDL 28, LDL 93 ~  FLP 10/13 showed TChol 142, TG 95, HDL 32, LDL 91... rec same diet, incr exercise.  GERD, Erosive Esophagitis >> on PRILOSEC 20mg  1-2 daily ~  Prev EGD revealed erosive esophagitis per [facility]; treated w/ PPI daily...  IRRITABLE BOWEL SYNDROME (ICD-564.1) - GI eval & Rx by [facility] in the past> Rx w/ ZOFRAN4mg  or PHENERGAN25mg  as needed. 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  S/P CHOLECYSTECTOMY - he had lap chole 4/07 by [facility]...   ADENOCARCINOMA,  PROSTATE (ICD-185) - Dx 9/06 by [facility] w/ prostate bx after rising PSA;  Pt opted for Watchful Waiting after consults w/ Kristopher Glee, & [facility] DrPolascik- considered cryotherapy but opted for watchful waiting...  ~  3/10: he had f/u visit w/ DrMurray to discuss XRT... ~  11/10: pt tells me that  he has decided to proceed w/ surg in the spring==> had robotic-assisted laparoscopic radical prostatectomy w/ bilat pelvic lymphadenectomy from [facility] 3/11... ~  He has continued his regular f/u visits w/ DrDavis every 18mo, & he reports slowly rising PSA in follow up...  LBP & Left SHOULDER PAIN >> hx left shoulder pain w/ ortho eval by DrNorris 3/10- ?small tear & he was given injection + phys therapy... He has hx LBP & prev saw Chiropractor regularly, now uses an inversion table ~weekly w/ good relief...  INFECTIOUS MONONUCLEOSIS, HX OF (ICD-V12.09)  SHINGLES, HX OF (ICD-V13.8)  Hx of CLAUSTROPHOBIA   Past Medical History  Diagnosis Date  . Acid reflux   . Cancer   . Prostate cancer   . Complication of anesthesia     trouble waking up  . Chronic recurrent bronchiolitis   . Hypercholesterolemia   . IBS (irritable bowel syndrome)   . Prostate cancer   . Shoulder pain, left   . H/O infectious mononucleosis   . History of shingles     Past Surgical History  Procedure Date  . Cholecystectomy s/p lap chole 4/07    Dr Derrell Lolling  . Prostatectomy   . Appendectomy age 69    Outpatient Encounter Prescriptions as of 02/14/2012  Medication Sig Dispense Refill  . Ascorbic Acid (VITAMIN C) 1000 MG tablet Take 1,000 mg by mouth daily.      [name] ibuprofen (ADVIL,MOTRIN) 200 MG tablet Take 200 mg by mouth every 6 (six) hours as needed. For pain      . Multiple Vitamin (MULTIVITAMIN) tablet Take 1 tablet by mouth daily.      [name] omeprazole (PRILOSEC) 20 MG capsule Take 20 mg by mouth 2 (two) times daily.       . ondansetron (ZOFRAN) 4 MG tablet Take 4 mg by mouth every 8 (eight) hours as  needed. For nausea      . Probiotic Product (PROBIOTIC DAILY) CAPS Take 1 capsule by mouth daily.      . promethazine (PHENERGAN) 12.5 MG tablet Take 12.5 mg by mouth every 6 (six) hours as needed.      . polyethylene glycol powder (MIRALAX) powder Take 8.5 g by mouth 3 (three) times a week.        No Known Allergies   Current Medications, Allergies, Past Medical History, Past Surgical History,  Family History, and Social History were reviewed in [employer] record.   Review of Systems        The patient complains of nausea, diarrhea, change in bowel habits, abdominal pain, gas/bloating, and indigestion/heartburn.  The patient denies fever, chills, sweats, anorexia, fatigue, weakness, malaise, weight loss, sleep disorder, blurring, diplopia, eye irritation, eye discharge, vision loss, eye pain, photophobia, earache, ear discharge, tinnitus, decreased hearing, nasal congestion, nosebleeds, sore throat, hoarseness, chest pain, palpitations, syncope, dyspnea on exertion, orthopnea, PND, peripheral edema, cough, dyspnea at rest, excessive sputum, hemoptysis, wheezing, pleurisy, vomiting, constipation, melena, hematochezia, jaundice, dysphagia, odynophagia, dysuria, hematuria, urinary frequency, urinary hesitancy, nocturia, incontinence, back pain, joint pain, joint swelling, muscle cramps, muscle weakness, stiffness, arthritis, sciatica, restless legs, leg pain at night, leg pain with exertion, rash, itching, dryness, suspicious lesions, paralysis, paresthesias, seizures, tremors, vertigo, transient blindness, frequent falls, frequent headaches, difficulty walking, depression, anxiety, memory loss, confusion, cold intolerance, heat intolerance, polydipsia, polyphagia, polyuria, unusual weight change, abnormal bruising, bleeding, enlarged lymph nodes, urticaria, allergic rash, hay fever, and recurrent infections.     Objective:   Physical Exam     WD, WN, 61 y/o WM in  [place]... GENERAL:  Alert & oriented; pleasant & cooperative... HEENT:  Earl/AT, EOM-wnl, PERRLA, Fundi-benign, EACs-clear, TMs-wnl, NOSE-clear, THROAT-clear & wnl. NECK:  Supple w/ full ROM; no JVD; normal carotid impulses w/o bruits; no thyromegaly or nodules palpated; no lymphadenopathy. CHEST:  Clear to P & A; without wheezes/ rales/ or rhonchi. HEART:  Regular Rhythm; without murmurs/ rubs/ or gallops. ABDOMEN:  Soft w/ min epig discomfort; normal bowel sounds; no organomegaly or masses detected. EXT: without deformities or arthritic changes; no varicose veins/ venous insuffic/ or edema. NEURO:  CN's intact; motor testing normal; sensory testing normal; gait normal & balance OK. DERM:  No lesions noted; no rash etc...  RADIOLOGY DATA:  Reviewed in the EPIC EMR & discussed w/ the patient...  LABORATORY DATA:  Reviewed in the EPIC EMR & discussed w/ the patient...   Assessment:      CPX>> good general medical health...  Hx Borderline Hypercholesterolemia> on diet alone... HDL is low & rec to incr exercise...  GERD/ Erosive Esophagitis> well controlled on Prilosec...  IBS> followed by [facility] on Probiotic & doing satis...  S/P GB/ prob hepatic steatosis> advised against Etoh, weight is good, LFTs have been normal...  Prostate Cancer>  Followed by [id number] & he does his PSAs...  Hx LBP, left shoulder pain> he uses OTC meds as needed; he will f/u w/ [facility] prn...  Other medical issues as noted...     Plan:     Patient's Medications  New Prescriptions   No medications on file  Previous Medications   ASCORBIC ACID (VITAMIN C) 1000 MG TABLET    Take 1,000 mg by mouth daily.   IBUPROFEN (ADVIL,MOTRIN) 200 MG TABLET    Take 200 mg by mouth every 6 (six) hours as needed. For pain   MULTIPLE VITAMIN (MULTIVITAMIN) TABLET    Take 1 tablet by mouth daily.   OMEPRAZOLE (PRILOSEC) 20 MG CAPSULE    Take 20 mg by mouth 2 (two) times daily.    ONDANSETRON (ZOFRAN) 4 MG TABLET    Take  4 mg by mouth every 8 (eight) hours as needed. For nausea   POLYETHYLENE GLYCOL POWDER (MIRALAX) POWDER    Take 8.5 g by mouth 3 (three) times a week.   PROBIOTIC PRODUCT (PROBIOTIC DAILY) CAPS    Take 1 capsule by  mouth daily.   PROMETHAZINE (PHENERGAN) 12.5 MG TABLET    Take 12.5 mg by mouth every 6 (six) hours as needed.  Modified Medications   No medications on file  Discontinued Medications   No medications on file

## 2012-02-20 ENCOUNTER — Other Ambulatory Visit (INDEPENDENT_AMBULATORY_CARE_PROVIDER_SITE_OTHER): Payer: BC Managed Care – PPO

## 2012-02-20 DIAGNOSIS — Z0000 Encounter for general adult medical examination without abnormal findings: Secondary | ICD-10-CM

## 2012-02-20 LAB — LIPID PANEL
HDL: 31.8 mg/dL — ABNORMAL LOW (ref >39.00)
LDL Cholesterol: 91 mg/dL (ref 0–99)
Total CHOL/HDL Ratio: 4
Triglycerides: 95 mg/dL (ref 0.0–149.0)
VLDL: 19 mg/dL (ref 0.0–40.0)

## 2012-10-12 ENCOUNTER — Other Ambulatory Visit: Payer: Self-pay | Admitting: Pulmonary Disease

## 2012-10-12 ENCOUNTER — Other Ambulatory Visit (INDEPENDENT_AMBULATORY_CARE_PROVIDER_SITE_OTHER): Payer: BC Managed Care – PPO

## 2012-10-12 DIAGNOSIS — Q043 Other reduction deformities of brain: Secondary | ICD-10-CM

## 2012-10-12 DIAGNOSIS — M545 Low back pain: Secondary | ICD-10-CM

## 2012-10-12 LAB — CBC WITH DIFFERENTIAL/PLATELET
Basophils Relative: 0.5 % (ref 0.0–3.0)
Eosinophils Relative: 1.4 % (ref 0.0–5.0)
MCV: 91.5 fl (ref 78.0–100.0)
Monocytes Absolute: 0.3 10*3/uL (ref 0.1–1.0)
Monocytes Relative: 5.3 % (ref 3.0–12.0)
Neutrophils Relative %: 64.2 % (ref 43.0–77.0)
Platelets: 162 10*3/uL (ref 150.0–400.0)
RBC: 4.18 Mil/uL — ABNORMAL LOW (ref 4.22–5.81)
WBC: 5.6 10*3/uL (ref 4.5–10.5)

## 2012-10-12 LAB — COMPREHENSIVE METABOLIC PANEL
ALT: 28 U/L (ref 0–53)
AST: 31 U/L (ref 0–37)
Albumin: 3.9 g/dL (ref 3.5–5.2)
BUN: 18 mg/dL (ref 6–23)
Calcium: 9.1 mg/dL (ref 8.4–10.5)
Chloride: 105 mEq/L (ref 96–112)
Potassium: 3.6 mEq/L (ref 3.5–5.1)

## 2012-10-15 LAB — ANA: Anti Nuclear Antibody(ANA): NEGATIVE

## 2012-10-16 LAB — UIFE/LIGHT CHAINS/TP QN, 24-HR UR
Alpha 1, Urine: DETECTED — AB
Alpha 2, Urine: DETECTED — AB
Free Kappa Lt Chains,Ur: 2.74 mg/dL — ABNORMAL HIGH (ref 0.14–2.42)
Free Kappa/Lambda Ratio: 9.79 ratio (ref 2.04–10.37)
Total Protein, Urine: 4.1 mg/dL

## 2012-10-16 LAB — IMMUNOFIXATION ELECTROPHORESIS
IgA: 273 mg/dL (ref 68–379)
IgG (Immunoglobin G), Serum: 1090 mg/dL (ref 650–1600)
IgM, Serum: 116 mg/dL (ref 41–251)
Total Protein, Serum Electrophoresis: 6.5 g/dL (ref 6.0–8.3)

## 2012-10-17 LAB — UIFE/LIGHT CHAINS/TP QN, 24-HR UR
Free Kappa Lt Chains,Ur: 2.18 mg/dL (ref 0.14–2.42)
Free Kappa/Lambda Ratio: 9.48 ratio (ref 2.04–10.37)
Free Lambda Excretion/Day: 2.3 mg/d
Free Lambda Lt Chains,Ur: 0.23 mg/dL (ref 0.02–0.67)
Free Lt Chn Excr Rate: 21.8 mg/d
Gamma Globulin, Urine: DETECTED — AB
Total Protein, Urine: 3 mg/dL

## 2012-12-21 ENCOUNTER — Telehealth: Payer: Self-pay | Admitting: Pulmonary Disease

## 2012-12-21 NOTE — Telephone Encounter (Signed)
Pt had called office earlier in day and did not receive a response. He has had 2-3 days of sinus fullness and congestion. Now with PNDS and ST. 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 Previously, for similar complaints, he has responded well to azithro and steroids.   Z pak as directed Medrol dosepak as directed   Above called in to [facility], MD ; [facility] [phone].  After 5:30 PM or weekends, call [phone]

## 2012-12-21 NOTE — Telephone Encounter (Signed)
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 Kriste Basque please advise, thank you!  Last OV 10/23/12 w 1 yr follow up

## 2012-12-21 NOTE — Telephone Encounter (Signed)
ATC patient, no answer LMOMTCB 

## 2013-04-02 ENCOUNTER — Telehealth: Payer: Self-pay | Admitting: Pulmonary Disease

## 2013-04-02 MED ORDER — LEVOFLOXACIN 500 MG PO TABS: 500.0000 mg | ORAL_TABLET | Freq: Every day | ORAL | Status: DC

## 2013-04-02 MED ORDER — METHYLPREDNISOLONE 4 MG PO KIT: PACK | ORAL | Status: DC

## 2013-04-02 NOTE — Telephone Encounter (Signed)
Pt is aware of SN recs. Rx's have been sent in. 

## 2013-04-02 NOTE — Telephone Encounter (Signed)
Per SN---  ??allergies?? levaquin 500 mg  #7  1 daily Medrol dosepak take as directed

## 2013-04-02 NOTE — Telephone Encounter (Signed)
Spoke with pt. Reports cough with production of green mucus and congestion x9 days. States that this happens every year and SN always sends in an abx. Would like something sent in.  SN - please advise.

## 2013-07-31 ENCOUNTER — Telehealth: Payer: Self-pay | Admitting: Pulmonary Disease

## 2013-07-31 NOTE — Telephone Encounter (Signed)
I spoke with pt's wife.  Advised SN will no longer be doing PCP after August 07, 2013.  I have provided her with [facility]e Space PCP office and [place] PCP office phone #s to call and set up appt to establish with new PCP.  Wife verbalized understanding and will call to do this.

## 2013-08-29 DIAGNOSIS — H0230 Blepharochalasis unspecified eye, unspecified eyelid: Secondary | ICD-10-CM | POA: Insufficient documentation

## 2013-10-14 DIAGNOSIS — R972 Elevated prostate specific antigen [PSA]: Secondary | ICD-10-CM | POA: Insufficient documentation

## 2013-12-02 ENCOUNTER — Telehealth: Payer: Self-pay | Admitting: *Deleted

## 2013-12-02 NOTE — Telephone Encounter (Signed)
Patient is aware of his situation. He works out of town a lot. I have him scheduled for August 4th but he may have to reschedule.

## 2013-12-06 ENCOUNTER — Telehealth: Payer: Self-pay | Admitting: *Deleted

## 2013-12-06 NOTE — Telephone Encounter (Signed)
Patient needs to reschedule appointment. He will be out of town having surgery. He is scheduled to see Dr. Valere Dross 12/26/13 @ 3:30 p.m.

## 2013-12-10 ENCOUNTER — Ambulatory Visit: Payer: BC Managed Care – PPO | Admitting: Radiation Oncology

## 2013-12-10 ENCOUNTER — Ambulatory Visit: Payer: BC Managed Care – PPO

## 2013-12-25 ENCOUNTER — Encounter: Payer: Self-pay | Admitting: Radiation Oncology

## 2013-12-25 NOTE — Progress Notes (Signed)
GU Location of Tumor / Histology: prostate adenocarcinoma , seminal vesicles not involved  If Prostate Cancer, Gleason Score is (4 + 3) and PSA is (0.50 on 11/25/13) 10/14/13 PSA 0.47 04/08/13 PSA 0.36 05/01/12 PSA 0.28 09/20/11 PSA  0.20  Christian Ward presented 2 months ago with signs/symptoms of: Rising PSA s/p radical prostatectomy 2011  Biopsies of prostate revealed:  08/05/2009 FINAL DIAGNOSIS  1. PROSTATE, RADICAL RESECTION, : PROSTATIC ADENOCARCINOMA, GLEASON'S SCORE 4+3=7 INVOLVING RIGHT AND LEFT LOBES WITH FOCAL EXTRACAPSULAR EXTENSION AND FOCAL MARGIN INVOLVEMENT. 2. LYMPH NODES, REGIONAL RESECTION, LEFT PELVIC : ONE BENIGN LYMPH NODE. 3. LYMPH NODES, REGIONAL RESECTION, RIGHT PELVIC : TWO BENIGN LYMPH NODES.  Past/Anticipated interventions by urology, if any: surgery, PSA surveliiance  Past/Anticipated interventions by medical oncology, if any: none  Weight changes, if any: no  Bowel/Bladder complaints, if any:  IPSS 7, intermittent stream but only at night, nocturia x 2   Nausea/Vomiting, if any: nausea due to GERD at times  Pain issues, if any:  Chronic back pain due to DDD, Celebrex daily is very helpful  SAFETY ISSUES:  Prior radiation? no  Pacemaker/ICD? no  Possible current pregnancy? na   Is the patient on methotrexate? no  Current Complaints / other details:  Married Brother had prostatectomy for cancer, father history of prostate cancer.

## 2013-12-26 ENCOUNTER — Encounter: Payer: Self-pay | Admitting: Radiation Oncology

## 2013-12-26 ENCOUNTER — Ambulatory Visit
Admission: RE | Admit: 2013-12-26 | Discharge: 2013-12-26 | Disposition: A | Discharge status: Home / Self Care | Payer: BC Managed Care – PPO | Source: Ambulatory Visit | Attending: Radiation Oncology | Admitting: Radiation Oncology

## 2013-12-26 VITALS — BP 139/88 | HR 60 | Temp 98.2°F | Resp 20 | Ht 76.0 in | Wt 231.7 lb

## 2013-12-26 DIAGNOSIS — Z79899 Other long term (current) drug therapy: Secondary | ICD-10-CM | POA: Diagnosis not present

## 2013-12-26 DIAGNOSIS — K219 Gastro-esophageal reflux disease without esophagitis: Secondary | ICD-10-CM | POA: Insufficient documentation

## 2013-12-26 DIAGNOSIS — Z510 Encounter for antineoplastic radiation therapy: Secondary | ICD-10-CM | POA: Insufficient documentation

## 2013-12-26 DIAGNOSIS — C61 Malignant neoplasm of prostate: Secondary | ICD-10-CM | POA: Diagnosis not present

## 2013-12-26 DIAGNOSIS — N393 Stress incontinence (female) (male): Secondary | ICD-10-CM | POA: Diagnosis not present

## 2013-12-26 DIAGNOSIS — Z9079 Acquired absence of other genital organ(s): Secondary | ICD-10-CM | POA: Insufficient documentation

## 2013-12-26 DIAGNOSIS — Z9089 Acquired absence of other organs: Secondary | ICD-10-CM | POA: Insufficient documentation

## 2013-12-26 DIAGNOSIS — N529 Male erectile dysfunction, unspecified: Secondary | ICD-10-CM | POA: Insufficient documentation

## 2013-12-26 NOTE — Progress Notes (Signed)
Please see the Nurse Progress Note in the MD Initial Consult Encounter for this patient. 

## 2013-12-26 NOTE — Progress Notes (Signed)
[facility] NEW PATIENT EVALUATION  Name: Christian Ward MRN: [id number]  Date:   12/26/2013           DOB: Apr 17, 1951  Status: outpatient   CC: Noralee Space, MD  Myrlene Broker, MD    REFERRING PHYSICIAN: Myrlene Broker, MD   DIAGNOSIS: PSA recurrent carcinoma the prostate, stage  pT3a N0  HISTORY OF PRESENT ILLNESS:  Christian Ward is a 63 y.o. male who is seen today through the courtesy of Dr. Lawerance Bach for discussion of possible salvage radiation therapy in the management of his pathologic stageT3a adenocarcinoma prostate. I first saw Christian Ward in December 2006 when he presented with stage T1c  favorable risk adenocarcinoma prostate. His PSA was 5.25 in August of 2006. He delayed curative therapy until 08/06/2009 when he underwent a robotic assisted laparoscopic radical prostatectomy and bilateral pelvic lymphadenectomy with Dr. Lawerance Bach. I understand that his PSA was approximately 12.5. On review of his pathology he was found have Gleason 7 (4+3) disease primarily involving the right lobe, but both lobes were involved. Approximately 30% of the gland containing carcinoma. There was involvement of the apex and there was focal extension beyond the prostatic capsule in the right posterior lateral prostate and in this area there was focal margin involvement. There was focal margin involvement along the right apex as well. 3 lymph nodes were free of metastatic disease. The seminal vesicles were not involved. His PSA in August of 2000 was 0.07, rising to 0.1 by April 2012,  0.15 by November 2012, 0.2 by May 2013, and most recently 0.5 on 11/25/2013. He is doing reasonably well from a GU and GI standpoint. He has minimal urinary stress incontinence. He does have erectile dysfunction which responds well to prostaglandin injections. He tells me that he works during the week in [place].  PREVIOUS RADIATION THERAPY: No   PAST MEDICAL HISTORY:  has a  past medical history of Acid reflux; Cancer; Complication of anesthesia; Chronic recurrent bronchiolitis; Hypercholesterolemia; IBS (irritable bowel syndrome); Shoulder pain, left; H/O infectious mononucleosis; History of shingles; and Prostate cancer (02/27/2007).     PAST SURGICAL HISTORY:  Past Surgical History  Procedure Laterality Date  . Cholecystectomy  s/p lap chole 4/07    Dr Dalbert Batman  . Prostatectomy  08/06/2009    Gleason 4+3=7  . Appendectomy  age 64  . Prostate biopsy  2008, 10/2008     FAMILY HISTORY: family history includes COPD in his father; Cancer in his brother and father; Coronary artery disease in his father; Diabetes in his father and sister; Heart disease in his father; Hypertension in his father; Lung disease in his father and mother; Stroke in his father.   SOCIAL HISTORY:  reports that he quit smoking about 27 years ago. His smoking use included Cigarettes. He started smoking about 33 years ago. He has a 5 pack-year smoking history. He has never used smokeless tobacco. He reports that he drinks alcohol. He reports that he does not use illicit drugs.   ALLERGIES: Oxycodone   MEDICATIONS:  Current Outpatient Prescriptions  Medication Sig Dispense Refill  . Ascorbic Acid (VITAMIN C) 1000 MG tablet Take 1,000 mg by mouth daily.      . celecoxib (CELEBREX) 200 MG capsule Take 200 mg by mouth daily.      [name] ibuprofen (ADVIL,MOTRIN) 200 MG tablet Take 200 mg by mouth every 6 (six) hours as needed. For pain      .  Multiple Vitamin (MULTIVITAMIN) tablet Take 1 tablet by mouth daily.      [name] omeprazole (PRILOSEC) 20 MG capsule Take 20 mg by mouth 2 (two) times daily.       . ondansetron (ZOFRAN) 4 MG tablet Take 4 mg by mouth every 8 (eight) hours as needed. For nausea      . polyethylene glycol powder (MIRALAX) powder Take 8.5 g by mouth 3 (three) times a week.      . Probiotic Product (PROBIOTIC DAILY) CAPS Take 1 capsule by mouth daily.      . promethazine (PHENERGAN)  12.5 MG tablet Take 12.5 mg by mouth every 6 (six) hours as needed.       No current facility-administered medications for this encounter.     REVIEW OF SYSTEMS:  Pertinent items are noted in HPI.    PHYSICAL EXAM:  height is 6\' 4"  (1.93 m) and weight is 231 lb 11.2 oz (105.098 kg). His oral temperature is 98.2 F (36.8 C). His blood pressure is 139/88 and his pulse is 60. His respiration is 20.    He is not examined today. He had a rectal examination with Dr. Rosana Hoes 2 months ago according to the patient.  LABORATORY DATA:  Lab Results  Component Value Date   WBC 5.6 10/12/2012   HGB 12.9* 10/12/2012   HCT 38.2* 10/12/2012   MCV 91.5 10/12/2012   PLT 162.0 10/12/2012   Lab Results  Component Value Date   NA 140 10/12/2012   K 3.6 10/12/2012   CL 105 10/12/2012   CO2 29 10/12/2012   Lab Results  Component Value Date   ALT 28 10/12/2012   AST 31 10/12/2012   ALKPHOS 66 10/12/2012   BILITOT 1.1 10/12/2012   PSA 0.50 from 11/25/2013   IMPRESSION: PSA recurrent carcinoma the prostate. I explained to Mr. Perz that he has either a local recurrence alone, distant recurrence alone, or both local and distant recurrences. Clinical predictors for local recurrence alone include a positive margin, disease-free interval, initial PSA of less than 10 and a Gleason score of 7 or less, and doubling time of greater than 1-2 years. He presents with a mixed picture, but the most important prognostic predictor for local recurrence is a positive margin, which he has. We discussed the timing of salvage radiation therapy, and most feel that salvage radiation therapy with a PSA of no more than 0.2-0.5 is preferred. We discussed the potential acute and late toxicities of radiation therapy. I suspect that he probably has a 50/50 chance of achieving remission with an ultimate long-term salvage rate of no more than 25%. He was given literature for review. We'll also discussed treatment in [place] where he works during the week.  They have [facility] and he can certainly receive quality IMRT where he resides during the week.   PLAN: The patient contact me if he wants to proceed with radiation therapy here in [facility] or a referral to radiation oncology at [facility]. I spent 45 minutes minutes face to face with the patient and more than 50% of that time was spent in counseling and/or coordination of care.

## 2014-01-03 ENCOUNTER — Encounter: Payer: Self-pay | Admitting: *Deleted

## 2014-01-03 NOTE — Progress Notes (Signed)
[place] Psychosocial Distress Screening Clinical Social Work  Clinical Social Work was referred by distress screening protocol.  The patient scored a 5 on the Psychosocial Distress Thermometer which indicates mild distress. Clinical Social Worker phoned pt at number listed below to assess for distress and other psychosocial needs. Pt stated he was not able to talk currently and [place] the beach and [place]. CSW was able to explain CSW role and availability of support. Pt still not sure about treatment, but may call back sometime next week to discuss concerns further.    ONCBCN DISTRESS SCREENING 12/26/2013  Screening Type Initial Screening  Elta Guadeloupe the number that describes how much distress you have been experiencing in the past week 5  Family Problem type Partner;Children  Physical Problem type Pain  Other "Family issues, back pain" listed as most distressing , may call patient [phone]     Clinical Social Worker follow up needed: Yes.    If yes, follow up plan: CSW awaits return call and pt aware of CSW availability to assist.   Loren Racer, [place] for [place] Wednesday, Thursday and Friday Phone: [phone] Fax: [phone]

## 2014-02-03 ENCOUNTER — Telehealth: Payer: Self-pay | Admitting: Pulmonary Disease

## 2014-02-03 NOTE — Telephone Encounter (Signed)
Called and spoke with pt and appt has been scheduled with SN on oct 23.  Pt is requesting that the imipramine be  Called to his pharmacy.  i dont see in the chart where we have filled this recently.  I called the pharmacy and they stated that they only keep information for 2 years.  This is not in his hx of medications.  Pt will call me back tomorrow to let me know the mg of this medication.

## 2014-02-04 NOTE — Telephone Encounter (Signed)
Dr Lenna Gilford, please advise if okay to refill for patient. Pt states that the Imipramine is 25mg  for fibromyalgia? Please advise thanks.

## 2014-02-04 NOTE — Telephone Encounter (Signed)
25 mg of the fibromyalgia pills per his wife [phone]

## 2014-02-05 NOTE — Telephone Encounter (Signed)
Per SN---  Last seen 2013.  SN already called in to the pharmacy for #30.  Pt is aware to keep appt with SN

## 2014-02-10 ENCOUNTER — Encounter: Payer: Self-pay | Admitting: Radiation Oncology

## 2014-02-10 NOTE — Progress Notes (Signed)
CC: Dr. Lawerance Bach  [name] left a message indicating that he wants to begin her radiation therapy here in [place] in the near future. We'll get him scheduled for simulation/treatment planning within the next week.

## 2014-02-24 ENCOUNTER — Telehealth: Payer: Self-pay | Admitting: Pulmonary Disease

## 2014-02-24 NOTE — Telephone Encounter (Signed)
Pt aware of below - states that he has already picked up Rx. States that he received a phone call from [place] on Friday and is returning her call. I do not see anywhere documented of this. Please advise Leigh. Thanks. Elie Confer, CMA at 02/05/2014 3:20 PM     Status: Signed        Per SN---  Last seen 2013. SN already called in to the pharmacy for #30. Pt is aware to keep appt with SN

## 2014-02-25 NOTE — Telephone Encounter (Signed)
lmtcb for pt to reschedule appt with SN.

## 2014-02-25 NOTE — Telephone Encounter (Signed)
I did call the pt.  SN will not be here on Friday and this is when his appt is.  I need to see if we can reschedule him to another day.  thanks

## 2014-02-26 ENCOUNTER — Encounter: Payer: Self-pay | Admitting: Pulmonary Disease

## 2014-02-26 NOTE — Telephone Encounter (Signed)
Called and spoke to pt. Appt changed to 03/14/14 with SN. Pt aware to be fasting. Pt verbalized understanding and denied any further questions or concerns at this time.

## 2014-02-28 ENCOUNTER — Ambulatory Visit: Payer: BC Managed Care – PPO | Admitting: Pulmonary Disease

## 2014-03-03 ENCOUNTER — Ambulatory Visit
Admission: RE | Admit: 2014-03-03 | Discharge: 2014-03-03 | Disposition: A | Discharge status: Home / Self Care | Payer: BC Managed Care – PPO | Source: Ambulatory Visit | Attending: Radiation Oncology | Admitting: Radiation Oncology

## 2014-03-03 DIAGNOSIS — C61 Malignant neoplasm of prostate: Secondary | ICD-10-CM | POA: Diagnosis not present

## 2014-03-03 DIAGNOSIS — Z510 Encounter for antineoplastic radiation therapy: Secondary | ICD-10-CM | POA: Insufficient documentation

## 2014-03-03 NOTE — Progress Notes (Signed)
Complex simulation/treatment planning note: The patient was taken to the CT simulator this morning. A Vac lock immobilization device was constructed. The patient was unsure as to his bladder filling and therefore he underwent a limited scan of his prostate bed/bladder. His bladder filling was less than optimal and he returned to the CT simulator in the afternoon. A urethrogram was performed along with placement of a red rubber per tube within the rectal vault. He was then scanned. The CT data set was sent to the MIM planning system where contoured his high risk prostate bed (CTV 66) and expanded this by 0.5 cm to create PTV 66 which will receive 6600 cGy in 33 sessions. On toward his rectum and bladder. He is now ready for IMRT simulation/treatment planning.

## 2014-03-04 ENCOUNTER — Other Ambulatory Visit: Payer: Self-pay | Admitting: Pulmonary Disease

## 2014-03-05 ENCOUNTER — Encounter: Payer: Self-pay | Admitting: Radiation Oncology

## 2014-03-05 DIAGNOSIS — Z510 Encounter for antineoplastic radiation therapy: Secondary | ICD-10-CM | POA: Diagnosis not present

## 2014-03-05 NOTE — Progress Notes (Signed)
IMRT simulation/treatment planning note: The patient completed IMRT treatment planning today and the management of his recurrent carcinoma the prostate. I were to is chosen to decrease the risk for both acute and late bladder and rectal toxicity compared to conventional or 3-D conformal radiation therapy. Dose volume histograms were obtained for the target structures and also avoidance structures including the rectum, bladder, and femoral heads. We met our department will guidelines. I'm prescribing 6600 cGy in 33 sessions utilizing 6 MV photons with dual ARC VMAT IMRT.

## 2014-03-11 DIAGNOSIS — Z510 Encounter for antineoplastic radiation therapy: Secondary | ICD-10-CM | POA: Diagnosis not present

## 2014-03-12 ENCOUNTER — Ambulatory Visit
Admission: RE | Admit: 2014-03-12 | Discharge: 2014-03-12 | Disposition: A | Discharge status: Home / Self Care | Payer: BC Managed Care – PPO | Source: Ambulatory Visit | Attending: Radiation Oncology | Admitting: Radiation Oncology

## 2014-03-12 DIAGNOSIS — C61 Malignant neoplasm of prostate: Secondary | ICD-10-CM

## 2014-03-12 DIAGNOSIS — Z510 Encounter for antineoplastic radiation therapy: Secondary | ICD-10-CM | POA: Diagnosis not present

## 2014-03-12 NOTE — Progress Notes (Signed)
Chart note: The patient began his IMRT today in the management of his recurrent carcinoma the prostate. He is being treated with dual ARC VMAT IMRT with dynamic MLCs. This represents an IMRT treatment devices [phone]).

## 2014-03-12 NOTE — Progress Notes (Signed)
Patient education completed with pt. Gave him "Radiation and You" booklet with all pertinent information marked and discussed, re: rectal irritation/care, fatigue, urinary/bladder irritation/management, nutrition, pain. Teach back used. Pt verbalized understanding.

## 2014-03-13 ENCOUNTER — Ambulatory Visit
Admission: RE | Admit: 2014-03-13 | Discharge: 2014-03-13 | Disposition: A | Discharge status: Home / Self Care | Payer: BC Managed Care – PPO | Source: Ambulatory Visit | Attending: Radiation Oncology | Admitting: Radiation Oncology

## 2014-03-13 DIAGNOSIS — Z510 Encounter for antineoplastic radiation therapy: Secondary | ICD-10-CM | POA: Diagnosis not present

## 2014-03-14 ENCOUNTER — Ambulatory Visit (INDEPENDENT_AMBULATORY_CARE_PROVIDER_SITE_OTHER): Payer: BC Managed Care – PPO | Admitting: Pulmonary Disease

## 2014-03-14 ENCOUNTER — Ambulatory Visit
Admission: RE | Admit: 2014-03-14 | Discharge: 2014-03-14 | Disposition: A | Discharge status: Home / Self Care | Payer: BC Managed Care – PPO | Source: Ambulatory Visit | Attending: Radiation Oncology | Admitting: Radiation Oncology

## 2014-03-14 ENCOUNTER — Ambulatory Visit (INDEPENDENT_AMBULATORY_CARE_PROVIDER_SITE_OTHER)
Admission: RE | Admit: 2014-03-14 | Discharge: 2014-03-14 | Disposition: A | Discharge status: Home / Self Care | Payer: BC Managed Care – PPO | Source: Ambulatory Visit | Attending: Pulmonary Disease | Admitting: Pulmonary Disease

## 2014-03-14 ENCOUNTER — Other Ambulatory Visit (INDEPENDENT_AMBULATORY_CARE_PROVIDER_SITE_OTHER): Payer: BC Managed Care – PPO

## 2014-03-14 ENCOUNTER — Encounter: Payer: Self-pay | Admitting: Pulmonary Disease

## 2014-03-14 VITALS — BP 120/82 | HR 60 | Temp 98.7°F | Ht 76.0 in | Wt 233.3 lb

## 2014-03-14 DIAGNOSIS — K219 Gastro-esophageal reflux disease without esophagitis: Secondary | ICD-10-CM

## 2014-03-14 DIAGNOSIS — Z0000 Encounter for general adult medical examination without abnormal findings: Secondary | ICD-10-CM

## 2014-03-14 DIAGNOSIS — K589 Irritable bowel syndrome without diarrhea: Secondary | ICD-10-CM

## 2014-03-14 DIAGNOSIS — M545 Low back pain, unspecified: Secondary | ICD-10-CM

## 2014-03-14 DIAGNOSIS — Z510 Encounter for antineoplastic radiation therapy: Secondary | ICD-10-CM | POA: Diagnosis not present

## 2014-03-14 DIAGNOSIS — E7800 Pure hypercholesterolemia, unspecified: Secondary | ICD-10-CM

## 2014-03-14 DIAGNOSIS — C61 Malignant neoplasm of prostate: Secondary | ICD-10-CM

## 2014-03-14 LAB — CBC WITH DIFFERENTIAL/PLATELET
BASOS PCT: 0.5 % (ref 0.0–3.0)
Basophils Absolute: 0 10*3/uL (ref 0.0–0.1)
Eosinophils Absolute: 0.1 10*3/uL (ref 0.0–0.7)
Eosinophils Relative: 1.9 % (ref 0.0–5.0)
HCT: 38.5 % — ABNORMAL LOW (ref 39.0–52.0)
Hemoglobin: 13 g/dL (ref 13.0–17.0)
LYMPHS PCT: 28.1 % (ref 12.0–46.0)
Lymphs Abs: 1.4 10*3/uL (ref 0.7–4.0)
MCHC: 33.8 g/dL (ref 30.0–36.0)
MCV: 90.1 fl (ref 78.0–100.0)
MONOS PCT: 7.7 % (ref 3.0–12.0)
Monocytes Absolute: 0.4 10*3/uL (ref 0.1–1.0)
NEUTROS ABS: 3.2 10*3/uL (ref 1.4–7.7)
Neutrophils Relative %: 61.8 % (ref 43.0–77.0)
Platelets: 180 10*3/uL (ref 150.0–400.0)
RBC: 4.28 Mil/uL (ref 4.22–5.81)
RDW: 13.5 % (ref 11.5–15.5)
WBC: 5.1 10*3/uL (ref 4.0–10.5)

## 2014-03-14 LAB — BASIC METABOLIC PANEL
BUN: 18 mg/dL (ref 6–23)
CALCIUM: 9 mg/dL (ref 8.4–10.5)
CO2: 27 meq/L (ref 19–32)
CREATININE: 1.1 mg/dL (ref 0.4–1.5)
Chloride: 105 mEq/L (ref 96–112)
GFR: 70.45 mL/min (ref >60.00)
GLUCOSE: 95 mg/dL (ref 70–99)
Potassium: 4.3 mEq/L (ref 3.5–5.1)
Sodium: 139 mEq/L (ref 135–145)

## 2014-03-14 LAB — HEPATIC FUNCTION PANEL
ALBUMIN: 3.5 g/dL (ref 3.5–5.2)
ALT: 24 U/L (ref 0–53)
AST: 27 U/L (ref 0–37)
Alkaline Phosphatase: 67 U/L (ref 39–117)
Bilirubin, Direct: 0.2 mg/dL (ref 0.0–0.3)
TOTAL PROTEIN: 7 g/dL (ref 6.0–8.3)
Total Bilirubin: 1 mg/dL (ref 0.2–1.2)

## 2014-03-14 LAB — LIPID PANEL
Cholesterol: 159 mg/dL (ref 0–200)
HDL: 32 mg/dL — AB (ref >39.00)
LDL Cholesterol: 114 mg/dL — ABNORMAL HIGH (ref 0–99)
NONHDL: 127
Total CHOL/HDL Ratio: 5
Triglycerides: 65 mg/dL (ref 0.0–149.0)
VLDL: 13 mg/dL (ref 0.0–40.0)

## 2014-03-14 LAB — TSH: TSH: 1.42 u[IU]/mL (ref 0.35–4.50)

## 2014-03-14 NOTE — Patient Instructions (Signed)
Today we updated your med list in our EPIC system...    Continue your current medications the same...  Today we did your follow up CXR, EKG, & FASTING blood work...    We will contact you w/ the results when available...   Good luck w/ the XRay therapy (DrDaiv & Valere Dross) & back pain evaluation (DrJones)...  Call for any questions or if we can be of service in any way...  Let's plan a follow up visit in 51yr, sooner if needed for problems.Marland KitchenMarland Kitchen

## 2014-03-15 ENCOUNTER — Encounter: Payer: Self-pay | Admitting: Pulmonary Disease

## 2014-03-15 DIAGNOSIS — M545 Low back pain, unspecified: Secondary | ICD-10-CM | POA: Insufficient documentation

## 2014-03-15 NOTE — Progress Notes (Signed)
Subjective:     Patient ID: Christian Ward, male   DOB: 1951/02/24, 63 y.o.   MRN: [id number]  HPI 63 y/o WM here for a follow up visit and CPX... last seen 12/08 before we started EMR...  ~  April 06, 2009:  he tells me that he has decided to proceed w/ definitive treatment for his prostate cancer, and that he is leaning towards robotic surgery... as noted below he has been on a watchful waiting protocol w/ biopsies every year or so & the disease is progressing... he inquired about a new radiation technique at [facility] but it requires him to remain in [place] for 7-8 weeks... he is due to f/u w/ DrRDavis soon...  his CC includes some reflux, alteration in bowel habits, and nausea symptoms for which he would like a referral to [facility]- we discussed Rx w/ Prilosec OTC & incr fiber/ Metamucil for his IBS-like symptoms...  ~  February 14, 2012:  89yr ROV & CPX> after I saw him last, in 2010, Christian Ward decided on Robotic-assisted laparoscopic radical prostatectomy w/ bilat pelvic lymphadenectomy from [facility], done 3/11; he reports signif ED post surg & he continues to f/u w/ DrDavis every 67mo for re-eval & PSAs; he tells me that his PSA was ?0.01(or 0.1) and increased to 0.02(or 0.2) on last check- we do not have records from Urology; he says that Lequita Halt is going to consider some new med if it rises any further...     Christian Ward has also followed up regularly w/ [facility] for GI> he reports an EGD w/ erosive esophagitis & has been well maintained on Prilosec $RemoveBef'20mg'YJxMihjGSk$  once or twice daily, he will need this long term & DrMedoff wanted him to have BMD but he wants to hold off for now (may be able to switch to H2blockers); he is due for a follow up colonoscopy soon & they plan f/u EGD at the same time...     He was hosp 5/13 w/ ?UTI, later felt to be "an intestinal virus"; he had n/v, dehydrated, given IV fluids and phenergan which helped; all symptoms resolved w/o recurrence....    He also notes intermittent mild LBP on  the left side; prev seen by Chiropractor but little need since he got an incline board (inversion table) which he uses once a week or so; thinks he needs a new mattress; he will consider Ortho eval if the back discomfort increases...     We reviewed prob list, meds, xrays and labs> see below for updates >> he declines flu vaccine...  CXR 5/13 showed normal heart size, clear lungs...   EKG 5/13 showed NSR, rate67, borderline IVCD, otherw wnl...  Abd Ultrasound 5/13: s/p GB, diffuse incr hepatic echogenicity c/w hepatic steatosis, otherw neg...  LABS 5/13:  Chems- wnl x K=2.9, repleted;  LFTs were wnl;  CBC- wnl;    LABS 9/13:  Chems- wnl x BS=114, K=3.9;  LFTs remain wnl;  CBC- wnl;    He will ret for FLP (wnl x HDL=32) & TSH (wnl at 1.84);  PSAs done by Urology... ADDENDUM>> 10/16/12>> Christian Ward tells me he had incr daily LBP & went to see DrGioffre w/ XRays & MRI done (?results- not in Epic); Sent here for extensive lab work requested by [employer]- all looks wnl (CBC, Chems, SPE, Urine for BJP, Sed/ CRP, Uric, ANA).[name] He tells me that Celebrex helped a little but Pred has helped a lot & he has f/u w/ Ortho soon...  ~  March 14, 2014:  76yr ROV & CPX> Christian Ward has a new job working in [employer] has not been as active as prev- he has gained 20# to 233# over the interval;  He has had a rising PSA followed by drRDavis 7 has just started IMRT from [employer];  He has also had incr LBP, treated by DrGioffre and recently eval by NS DrDJones w/ another MRI pending... We reviewed the following medical problems during today's office visit >>     Hx Bronchitis> on Flonase prn; no recent URI/ bronchitic exac & denies cough, sput, SOB; but he has not been as active due to LBP etc...    Mild Hyperchol> on diet alone; FLP 11/15 showed TChol 159, TG 65, HDL 32, LDL 114 & we reviewed diet, exercise, wt reduction...    Hx GERD & Esophagitis> on Prilosec20Bid, plus Zofran/ Phenergan prn; followed by [facility] w/ EGD 9/15  showing [place] & nonobstructing schatzki ring- rec top continue PPI...    Hx IBS> on Probiotic daily & Miralax prn; follwed by [facility] w/ colonoscopy 9/15 showing int hems otherw neg & no polyps; f/u rec in 50yrs...    S/P LapChole> he had lap chole 4/07 by DrHIngram...    Prostate Cancer> initially dx 2006 on watchful waiting protocol; he had radical surg 3/11 by [email or web address]; subseq rising PSA to 0.5 & started IMRT per [facility] 11/15...     LBP> on Celebrex200 as needed; prev rx by chiro w/ inversion table; eval by DrGioffre & more recently by NS- DrDJones, he tells me he has another MRI pending...    Hx FM> on Imipramine25 as needed; he gets FM "attacks" twice yearly & uses the Imip25 during those times w/ good relief... We reviewed prob list, meds, xrays and labs> see below for updates >>   CXR 11/15 showed clear/ NAD.Marland KitchenMarland Kitchen  EKG 11/15 showed SBrady, rate57, wnl/ NAD...   LABS 11/15:  FLP- not quite at goals w/ HDL=32, LDL=114;  Chems- wnl;  CBC- wnl;  TSH=1.42...           Problem List:    BRONCHITIS, RECURRENT (ICD-491.9) - hx AB in the past but no recent problems w/ cough, congestion, etc...   HYPERCHOLESTEROLEMIA, MILD (ICD-272.0) - on diet Rx alone... ~  [place] 2/08 showed TChol 168, TG 68, HDL 38, LDL 117... he prefers diet + exercise Rx. ~  [place] 11/10 showed TChol 142, TG 105, HDL 28, LDL 93 ~  FLP 10/13 showed TChol 142, TG 95, HDL 32, LDL 91... rec same diet, incr exercise. ~  FLP 11/15 on diet alone showed  TChol 159, TG 65, HDL 32, LDL 114 & we reviewed diet, exercise, wt reduction  GERD, Erosive Esophagitis >> on PRILOSEC $RemoveBef'20mg'qreikRgNGq$  1-2 daily ~  Prev EGD revealed erosive esophagitis per [facility]; treated w/ PPI daily... ~  9/15: on Prilosec20Bid, plus Zofran/ Phenergan prn; followed by [facility] w/ EGD 9/15 showing [place] & nonobstructing schatzki ring- rec top continue PPI...  IRRITABLE BOWEL SYNDROME (ICD-564.1) - GI eval & Rx by [facility] in the past> Rx w/ ZOFRAN$RemoveBef'4mg'XPjtzgaapK$  or PHENERGAN$RemoveBefo'25mg'RQWdRtBPnUm$   as needed. 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 ~  9/15: on Probiotic daily & Miralax prn; follwed by [facility] w/ colonoscopy 9/15 showing int hems otherw neg & no polyps; f/u rec in 45yrs...  S/P CHOLECYSTECTOMY - he had lap chole 4/07 by [facility]...   ADENOCARCINOMA, PROSTATE (ICD-185) - Dx 9/06 by [facility] w/ prostate bx after rising PSA;  Pt opted for Watchful Waiting after consults w/ DrDavis,  DrMurray, & [facility] DrPolascik- considered cryotherapy but opted for watchful waiting...  ~  3/10: he had f/u visit w/ DrMurray to discuss XRT... ~  11/10: pt tells me that  he has decided to proceed w/ surg in the spring==> had robotic-assisted laparoscopic radical prostatectomy w/ bilat pelvic lymphadenectomy from [facility] 3/11... ~  He has continued his regular f/u visits w/ DrDavis every 3mo, & he reports slowly rising PSA in follow up... ~  11/15: he has started IMRT per [facility] for slowly rising PSA, local recurrence; he is followed by [facility] regularly but we don't have notes from him...   LBP & Left SHOULDER PAIN >> hx left shoulder pain w/ ortho eval by DrNorris 3/10- ?small tear & he was given injection + phys therapy... He has hx LBP & prev saw Chiropractor regularly, now uses an inversion table ~weekly w/ good relief... ~  LBP eval & Rx from DrGioffre (we don't have notes from him); pt tells me he had MRI in past & med rx; on Celebrex as needed... ~  10/15: pt sought 2nd opinion from NS- DrDJones and they plan another MRI soon for further eval...   INFECTIOUS MONONUCLEOSIS, HX OF (ICD-V12.09)  SHINGLES, HX OF (ICD-V13.8)  Hx of CLAUSTROPHOBIA   Past Medical History  Diagnosis Date  . Acid reflux   . Cancer   . Complication of anesthesia     trouble waking up  . Chronic recurrent bronchiolitis   . Hypercholesterolemia   . IBS (irritable bowel syndrome)   . Shoulder pain, left   . H/O infectious mononucleosis   . History of shingles   . Prostate cancer  02/27/2007    Gleason 4+3=7    Past Surgical History  Procedure Laterality Date  . Cholecystectomy  s/p lap chole 4/07    Dr Derrell Lolling  . Prostatectomy  08/06/2009    Gleason 4+3=7  . Appendectomy  age 34  . Prostate biopsy  2008, 10/2008    Outpatient Encounter Prescriptions as of 03/14/2014  Medication Sig  . Ascorbic Acid (VITAMIN C) 1000 MG tablet Take 1,000 mg by mouth daily.  . celecoxib (CELEBREX) 200 MG capsule Take 200 mg by mouth daily.  . fluticasone (FLONASE) 50 MCG/ACT nasal spray Place 1 spray into both nostrils daily.  [name] ibuprofen (ADVIL,MOTRIN) 200 MG tablet Take 200 mg by mouth every 6 (six) hours as needed. For pain  . imipramine (TOFRANIL) 25 MG tablet TAKE 1 TABLET BY MOUTH EVERY DAY  . Multiple Vitamin (MULTIVITAMIN) tablet Take 1 tablet by mouth daily.  [name] omeprazole (PRILOSEC) 20 MG capsule Take 20 mg by mouth 2 (two) times daily.   . ondansetron (ZOFRAN) 4 MG tablet Take 4 mg by mouth every 8 (eight) hours as needed. For nausea  . polyethylene glycol powder (MIRALAX) powder Take 8.5 g by mouth 2 (two) times a week.   . Probiotic Product (PROBIOTIC DAILY) CAPS Take 1 capsule by mouth daily.  . promethazine (PHENERGAN) 12.5 MG tablet Take 12.5 mg by mouth every 6 (six) hours as needed.    Allergies  Allergen Reactions  . Oxycodone Nausea Only    Current Medications, Allergies, Past Medical History, Past Surgical History, Family History, and Social History were reviewed in [employer] record.   Review of Systems         The patient denies fever, chills, sweats, anorexia, fatigue, weakness, malaise, weight loss, sleep disorder, blurring, diplopia, eye irritation, eye discharge, vision loss, eye pain, photophobia, earache, ear  discharge, tinnitus, decreased hearing, nasal congestion, nosebleeds, sore throat, hoarseness, chest pain, palpitations, syncope, dyspnea on exertion, orthopnea, PND, peripheral edema, cough, dyspnea at rest, excessive  sputum, hemoptysis, wheezing, pleurisy, vomiting, constipation, melena, hematochezia, jaundice, dysphagia, odynophagia, dysuria, hematuria, urinary frequency, urinary hesitancy, nocturia, incontinence, back pain, joint pain, joint swelling, muscle cramps, muscle weakness, stiffness, arthritis, sciatica, restless legs, leg pain at night, leg pain with exertion, rash, itching, dryness, suspicious lesions, paralysis, paresthesias, seizures, tremors, vertigo, transient blindness, frequent falls, frequent headaches, difficulty walking, depression, anxiety, memory loss, confusion, cold intolerance, heat intolerance, polydipsia, polyphagia, polyuria, unusual weight change, abnormal bruising, bleeding, enlarged lymph nodes, urticaria, allergic rash, hay fever, and recurrent infections.     Objective:   Physical Exam     WD, WN, 63 y/o WM in NAD... GENERAL:  Alert & oriented; pleasant & cooperative... HEENT:  [place]/AT, EOM-wnl, PERRLA, Fundi-benign, EACs-clear, TMs-wnl, NOSE-clear, THROAT-clear & wnl. NECK:  Supple w/ full ROM; no JVD; normal carotid impulses w/o bruits; no thyromegaly or nodules palpated; no lymphadenopathy. CHEST:  Clear to P & A; without wheezes/ rales/ or rhonchi. HEART:  Regular Rhythm; without murmurs/ rubs/ or gallops. ABDOMEN:  Soft w/ min epig discomfort; normal bowel sounds; no organomegaly or masses detected. EXT: without deformities or arthritic changes; no varicose veins/ venous insuffic/ or edema. NEURO:  CN's intact; motor testing normal; sensory testing normal; gait normal & balance OK. DERM:  No lesions noted; no rash etc...  RADIOLOGY DATA:  Reviewed in the EPIC EMR & discussed w/ the patient...  LABORATORY DATA:  Reviewed in the EPIC EMR & discussed w/ the patient...   Assessment:      CPX>> good general medical health...  Hx Borderline Hypercholesterolemia> on diet alone... HDL is low & rec to incr exercise...  GERD/ Erosive Esophagitis> well controlled on  Prilosec...  IBS> followed by [facility] on Probiotic & doing satis... Colon 9/15 was neg...  S/P GB/ prob hepatic steatosis> advised against Etoh, weight is good, LFTs have been normal...  Prostate Cancer>  Followed by [id number] & he does his PSAs; s/p radical prostatectomy 2011 and subseq rising PSA=> just started IMRT per DrMurray...  Hx LBP, left shoulder pain> he is on Celebrex per DrGioffre and recently saw DrDJones for NS consult & they plan f/u MRI soon...  Other medical issues as noted...     Plan:     Patient's Medications  New Prescriptions   No medications on file  Previous Medications   ASCORBIC ACID (VITAMIN C) 1000 MG TABLET    Take 1,000 mg by mouth daily.   CELECOXIB (CELEBREX) 200 MG CAPSULE    Take 200 mg by mouth daily.   FLUTICASONE (FLONASE) 50 MCG/ACT NASAL SPRAY    Place 1 spray into both nostrils daily.   IBUPROFEN (ADVIL,MOTRIN) 200 MG TABLET    Take 200 mg by mouth every 6 (six) hours as needed. For pain   IMIPRAMINE (TOFRANIL) 25 MG TABLET    TAKE 1 TABLET BY MOUTH EVERY DAY   MULTIPLE VITAMIN (MULTIVITAMIN) TABLET    Take 1 tablet by mouth daily.   OMEPRAZOLE (PRILOSEC) 20 MG CAPSULE    Take 20 mg by mouth 2 (two) times daily.    ONDANSETRON (ZOFRAN) 4 MG TABLET    Take 4 mg by mouth every 8 (eight) hours as needed. For nausea   POLYETHYLENE GLYCOL POWDER (MIRALAX) POWDER    Take 8.5 g by mouth 2 (two) times a week.    PROBIOTIC PRODUCT (PROBIOTIC DAILY) CAPS  Take 1 capsule by mouth daily.   PROMETHAZINE (PHENERGAN) 12.5 MG TABLET    Take 12.5 mg by mouth every 6 (six) hours as needed.  Modified Medications   No medications on file  Discontinued Medications   No medications on file

## 2014-03-17 ENCOUNTER — Ambulatory Visit
Admission: RE | Admit: 2014-03-17 | Discharge: 2014-03-17 | Disposition: A | Discharge status: Home / Self Care | Payer: BC Managed Care – PPO | Source: Ambulatory Visit | Attending: Radiation Oncology | Admitting: Radiation Oncology

## 2014-03-17 ENCOUNTER — Encounter: Payer: Self-pay | Admitting: Radiation Oncology

## 2014-03-17 VITALS — BP 121/75 | HR 66 | Temp 97.9°F | Resp 12 | Wt 230.9 lb

## 2014-03-17 DIAGNOSIS — C61 Malignant neoplasm of prostate: Secondary | ICD-10-CM

## 2014-03-17 DIAGNOSIS — Z510 Encounter for antineoplastic radiation therapy: Secondary | ICD-10-CM | POA: Diagnosis not present

## 2014-03-17 NOTE — Progress Notes (Signed)
He rates his pain as very mild, abdominal gas pains.  Reports no urinary abnormalities.  Pt states they urinate 1 - 2 times per night.  Pt reports Constipation which he reports is an ongoing issue, a bowel movement every day. The patient eats a regular, healthy diet.

## 2014-03-17 NOTE — Progress Notes (Signed)
   Weekly Management Note:  outpatient    ICD-9-CM ICD-10-CM   1. ADENOCARCINOMA, PROSTATE 185 C61     Current Dose:  8 Gy  Projected Dose: 66 Gy   Narrative:  The patient presents for routine under treatment assessment.  CBCT/MVCT images/Port film x-rays were reviewed.  The chart was checked. No new complaints.  Physical Findings:  weight is 230 lb 14.4 oz (104.736 kg). His oral temperature is 97.9 F (36.6 C). His blood pressure is 121/75 and his pulse is 66. His respiration is 12 and oxygen saturation is 98%.  NAD  Impression:  The patient is tolerating radiotherapy.  Plan:  Continue radiotherapy as planned.   ________________________________   Eppie Gibson, M.D.

## 2014-03-18 ENCOUNTER — Ambulatory Visit
Admission: RE | Admit: 2014-03-18 | Discharge: 2014-03-18 | Disposition: A | Discharge status: Home / Self Care | Payer: BC Managed Care – PPO | Source: Ambulatory Visit | Attending: Radiation Oncology | Admitting: Radiation Oncology

## 2014-03-18 DIAGNOSIS — Z510 Encounter for antineoplastic radiation therapy: Secondary | ICD-10-CM | POA: Diagnosis not present

## 2014-03-19 ENCOUNTER — Ambulatory Visit
Admission: RE | Admit: 2014-03-19 | Discharge: 2014-03-19 | Disposition: A | Discharge status: Home / Self Care | Payer: BC Managed Care – PPO | Source: Ambulatory Visit | Attending: Radiation Oncology | Admitting: Radiation Oncology

## 2014-03-19 DIAGNOSIS — Z510 Encounter for antineoplastic radiation therapy: Secondary | ICD-10-CM | POA: Diagnosis not present

## 2014-03-20 ENCOUNTER — Ambulatory Visit
Admission: RE | Admit: 2014-03-20 | Discharge: 2014-03-20 | Disposition: A | Discharge status: Home / Self Care | Payer: BC Managed Care – PPO | Source: Ambulatory Visit | Attending: Radiation Oncology | Admitting: Radiation Oncology

## 2014-03-20 DIAGNOSIS — Z510 Encounter for antineoplastic radiation therapy: Secondary | ICD-10-CM | POA: Diagnosis not present

## 2014-03-21 ENCOUNTER — Ambulatory Visit
Admission: RE | Admit: 2014-03-21 | Discharge: 2014-03-21 | Disposition: A | Discharge status: Home / Self Care | Payer: BC Managed Care – PPO | Source: Ambulatory Visit | Attending: Radiation Oncology | Admitting: Radiation Oncology

## 2014-03-21 DIAGNOSIS — Z510 Encounter for antineoplastic radiation therapy: Secondary | ICD-10-CM | POA: Diagnosis not present

## 2014-03-24 ENCOUNTER — Encounter: Payer: Self-pay | Admitting: Radiation Oncology

## 2014-03-24 ENCOUNTER — Ambulatory Visit
Admission: RE | Admit: 2014-03-24 | Discharge: 2014-03-24 | Disposition: A | Discharge status: Home / Self Care | Payer: BC Managed Care – PPO | Source: Ambulatory Visit | Attending: Radiation Oncology | Admitting: Radiation Oncology

## 2014-03-24 VITALS — BP 120/71 | HR 59 | Temp 98.2°F | Resp 16

## 2014-03-24 DIAGNOSIS — C61 Malignant neoplasm of prostate: Secondary | ICD-10-CM

## 2014-03-24 DIAGNOSIS — Z510 Encounter for antineoplastic radiation therapy: Secondary | ICD-10-CM | POA: Diagnosis not present

## 2014-03-24 NOTE — Progress Notes (Signed)
Weekly rad txs pr state 9/33 completed, no dysuria, constipation from taking cold medicine over the weekend,feels  Resolved of cold, drinks water at 6am 2 glasses of 20 oz, no pain  9:24 AM

## 2014-03-24 NOTE — Progress Notes (Signed)
Weekly Management Note:  Site:prostate bed Current Dose:  1800  cGy Projected Dose: 6600  cGy  Narrative: The patient is seen today for routine under treatment assessment. CBCT/MVCT images/port films were reviewed. The chart was reviewed.   Bladder filling is satisfactory, but can be improved. No significant GU or GI difficulty.  Physical Examination:  Filed Vitals:   03/24/14 0922  BP: 120/71  Pulse: 59  Temp: 98.2 F (36.8 C)  Resp: 16  .  Weight:  . No change.  Impression: Tolerating radiation therapy well.  Plan: Continue radiation therapy as planned.

## 2014-03-25 ENCOUNTER — Ambulatory Visit
Admission: RE | Admit: 2014-03-25 | Discharge: 2014-03-25 | Disposition: A | Discharge status: Home / Self Care | Payer: BC Managed Care – PPO | Source: Ambulatory Visit | Attending: Radiation Oncology | Admitting: Radiation Oncology

## 2014-03-25 DIAGNOSIS — Z510 Encounter for antineoplastic radiation therapy: Secondary | ICD-10-CM | POA: Diagnosis not present

## 2014-03-26 ENCOUNTER — Ambulatory Visit
Admission: RE | Admit: 2014-03-26 | Discharge: 2014-03-26 | Disposition: A | Discharge status: Home / Self Care | Payer: BC Managed Care – PPO | Source: Ambulatory Visit | Attending: Radiation Oncology | Admitting: Radiation Oncology

## 2014-03-26 ENCOUNTER — Other Ambulatory Visit: Payer: Self-pay | Admitting: Neurological Surgery

## 2014-03-26 DIAGNOSIS — Z510 Encounter for antineoplastic radiation therapy: Secondary | ICD-10-CM | POA: Diagnosis not present

## 2014-03-26 DIAGNOSIS — M545 Low back pain: Secondary | ICD-10-CM

## 2014-03-27 ENCOUNTER — Ambulatory Visit
Admission: RE | Admit: 2014-03-27 | Discharge: 2014-03-27 | Disposition: A | Discharge status: Home / Self Care | Payer: BC Managed Care – PPO | Source: Ambulatory Visit | Attending: Radiation Oncology | Admitting: Radiation Oncology

## 2014-03-27 DIAGNOSIS — Z510 Encounter for antineoplastic radiation therapy: Secondary | ICD-10-CM | POA: Diagnosis not present

## 2014-03-28 ENCOUNTER — Ambulatory Visit
Admission: RE | Admit: 2014-03-28 | Discharge: 2014-03-28 | Disposition: A | Discharge status: Home / Self Care | Payer: BC Managed Care – PPO | Source: Ambulatory Visit | Attending: Radiation Oncology | Admitting: Radiation Oncology

## 2014-03-28 DIAGNOSIS — Z510 Encounter for antineoplastic radiation therapy: Secondary | ICD-10-CM | POA: Diagnosis not present

## 2014-03-30 ENCOUNTER — Ambulatory Visit
Admission: RE | Admit: 2014-03-30 | Discharge: 2014-03-30 | Disposition: A | Discharge status: Home / Self Care | Payer: BC Managed Care – PPO | Source: Ambulatory Visit | Attending: Radiation Oncology | Admitting: Radiation Oncology

## 2014-03-30 DIAGNOSIS — Z510 Encounter for antineoplastic radiation therapy: Secondary | ICD-10-CM | POA: Diagnosis not present

## 2014-03-31 ENCOUNTER — Ambulatory Visit
Admission: RE | Admit: 2014-03-31 | Discharge: 2014-03-31 | Disposition: A | Discharge status: Home / Self Care | Payer: BC Managed Care – PPO | Source: Ambulatory Visit | Attending: Radiation Oncology | Admitting: Radiation Oncology

## 2014-03-31 ENCOUNTER — Encounter: Payer: Self-pay | Admitting: Radiation Oncology

## 2014-03-31 VITALS — BP 124/72 | HR 60 | Temp 97.8°F | Resp 20 | Wt 230.9 lb

## 2014-03-31 DIAGNOSIS — C61 Malignant neoplasm of prostate: Secondary | ICD-10-CM

## 2014-03-31 DIAGNOSIS — Z510 Encounter for antineoplastic radiation therapy: Secondary | ICD-10-CM | POA: Diagnosis not present

## 2014-03-31 NOTE — Progress Notes (Signed)
Weekly Management Note:  Site: Prostate bed Current Dose:  3000  cGy Projected Dose: 6600  cGy  Narrative: The patient is seen today for routine under treatment assessment. CBCT/MVCT images/port films were reviewed. The chart was reviewed.   Bladder filling is less than optimal, but satisfactory.  No significant GU or GI difficulties.  Physical Examination:  Filed Vitals:   03/31/14 0856  BP: 124/72  Pulse: 60  Temp: 97.8 F (36.6 C)  Resp: 20  .  Weight: 230 lb 14.4 oz (104.736 kg).  No change.  Impression: Tolerating radiation therapy well.  I encouraged him to improve his bladder filling. Plan: Continue radiation therapy as planned.

## 2014-03-31 NOTE — Progress Notes (Signed)
Patient reports very slight dysuria when beginning his stream first thing in the morning. He denies other urinary issues, denies bowel issues, fatigue, loss of appetite. He has constipation but this is not a new problem for him.

## 2014-04-01 ENCOUNTER — Ambulatory Visit
Admission: RE | Admit: 2014-04-01 | Discharge: 2014-04-01 | Disposition: A | Discharge status: Home / Self Care | Payer: BC Managed Care – PPO | Source: Ambulatory Visit | Attending: Radiation Oncology | Admitting: Radiation Oncology

## 2014-04-01 DIAGNOSIS — Z510 Encounter for antineoplastic radiation therapy: Secondary | ICD-10-CM | POA: Diagnosis not present

## 2014-04-02 ENCOUNTER — Ambulatory Visit
Admission: RE | Admit: 2014-04-02 | Discharge: 2014-04-02 | Disposition: A | Discharge status: Home / Self Care | Payer: BC Managed Care – PPO | Source: Ambulatory Visit | Attending: Radiation Oncology | Admitting: Radiation Oncology

## 2014-04-02 DIAGNOSIS — Z510 Encounter for antineoplastic radiation therapy: Secondary | ICD-10-CM | POA: Diagnosis not present

## 2014-04-04 ENCOUNTER — Ambulatory Visit: Payer: BC Managed Care – PPO

## 2014-04-07 ENCOUNTER — Ambulatory Visit
Admission: RE | Admit: 2014-04-07 | Discharge: 2014-04-07 | Disposition: A | Discharge status: Home / Self Care | Payer: BC Managed Care – PPO | Source: Ambulatory Visit | Attending: Radiation Oncology | Admitting: Radiation Oncology

## 2014-04-07 ENCOUNTER — Encounter: Payer: Self-pay | Admitting: Radiation Oncology

## 2014-04-07 VITALS — BP 130/82 | HR 63 | Temp 98.8°F | Resp 20 | Wt 231.6 lb

## 2014-04-07 DIAGNOSIS — C61 Malignant neoplasm of prostate: Secondary | ICD-10-CM

## 2014-04-07 DIAGNOSIS — Z510 Encounter for antineoplastic radiation therapy: Secondary | ICD-10-CM | POA: Diagnosis not present

## 2014-04-07 NOTE — Progress Notes (Signed)
Patient denies pain, fatigue, urinary/bowel issues, loss of appetite.  

## 2014-04-07 NOTE — Progress Notes (Signed)
Weekly Management Note:  Site: Prostate bed Current Dose:  3600  cGy Projected Dose: 6600  cGy  Narrative: The patient is seen today for routine under treatment assessment. CBCT/MVCT images/port films were reviewed. The chart was reviewed.   Bladder filling is satisfactory but can be improved.  No new GU or GI difficulties.  Physical Examination:  Filed Vitals:   04/07/14 0907  BP: 130/82  Pulse: 63  Temp: 98.8 F (37.1 C)  Resp: 20  .  Weight: 231 lb 9.6 oz (105.053 kg).  No change.  Impression: Tolerating radiation therapy well.  Plan: Continue radiation therapy as planned.

## 2014-04-08 ENCOUNTER — Ambulatory Visit
Admission: RE | Admit: 2014-04-08 | Discharge: 2014-04-08 | Disposition: A | Discharge status: Home / Self Care | Payer: BC Managed Care – PPO | Source: Ambulatory Visit | Attending: Radiation Oncology | Admitting: Radiation Oncology

## 2014-04-08 DIAGNOSIS — Z510 Encounter for antineoplastic radiation therapy: Secondary | ICD-10-CM | POA: Diagnosis not present

## 2014-04-09 ENCOUNTER — Ambulatory Visit
Admission: RE | Admit: 2014-04-09 | Discharge: 2014-04-09 | Disposition: A | Discharge status: Home / Self Care | Payer: BC Managed Care – PPO | Source: Ambulatory Visit | Attending: Radiation Oncology | Admitting: Radiation Oncology

## 2014-04-09 DIAGNOSIS — Z510 Encounter for antineoplastic radiation therapy: Secondary | ICD-10-CM | POA: Diagnosis not present

## 2014-04-10 ENCOUNTER — Ambulatory Visit
Admission: RE | Admit: 2014-04-10 | Discharge: 2014-04-10 | Disposition: A | Discharge status: Home / Self Care | Payer: BC Managed Care – PPO | Source: Ambulatory Visit | Attending: Radiation Oncology | Admitting: Radiation Oncology

## 2014-04-10 DIAGNOSIS — Z510 Encounter for antineoplastic radiation therapy: Secondary | ICD-10-CM | POA: Diagnosis not present

## 2014-04-11 ENCOUNTER — Ambulatory Visit
Admission: RE | Admit: 2014-04-11 | Discharge: 2014-04-11 | Disposition: A | Discharge status: Home / Self Care | Payer: BC Managed Care – PPO | Source: Ambulatory Visit | Attending: Radiation Oncology | Admitting: Radiation Oncology

## 2014-04-11 DIAGNOSIS — Z510 Encounter for antineoplastic radiation therapy: Secondary | ICD-10-CM | POA: Diagnosis not present

## 2014-04-14 ENCOUNTER — Ambulatory Visit
Admission: RE | Admit: 2014-04-14 | Discharge: 2014-04-14 | Disposition: A | Discharge status: Home / Self Care | Payer: BC Managed Care – PPO | Source: Ambulatory Visit | Attending: Radiation Oncology | Admitting: Radiation Oncology

## 2014-04-14 ENCOUNTER — Encounter: Payer: Self-pay | Admitting: Radiation Oncology

## 2014-04-14 VITALS — BP 121/73 | HR 68 | Temp 97.9°F | Resp 20 | Wt 231.9 lb

## 2014-04-14 DIAGNOSIS — Z510 Encounter for antineoplastic radiation therapy: Secondary | ICD-10-CM | POA: Diagnosis not present

## 2014-04-14 DIAGNOSIS — C61 Malignant neoplasm of prostate: Secondary | ICD-10-CM

## 2014-04-14 NOTE — Progress Notes (Signed)
Weekly Management Note:  Site: Prostate bed Current Dose:  4600  cGy Projected Dose: 6600  cGy  Narrative: The patient is seen today for routine under treatment assessment. CBCT/MVCT images/port films were reviewed. The chart was reviewed.   Bladder filling is satisfactory.  He does have urinary frequency as expected.  Physical Examination:  Filed Vitals:   04/14/14 0903  BP: 121/73  Pulse: 68  Temp: 97.9 F (36.6 C)  Resp: 20  .  Weight: 231 lb 14.4 oz (105.189 kg).  No change.  Impression: Tolerating radiation therapy well.  Plan: Continue radiation therapy as planned.

## 2014-04-14 NOTE — Progress Notes (Signed)
Patient denies pain, fatigue, bowel issues, loss of appetite. He does report urinary frequency which has increased since last week, but he denies other urinary issues.

## 2014-04-15 ENCOUNTER — Ambulatory Visit
Admission: RE | Admit: 2014-04-15 | Discharge: 2014-04-15 | Disposition: A | Discharge status: Home / Self Care | Payer: BC Managed Care – PPO | Source: Ambulatory Visit | Attending: Radiation Oncology | Admitting: Radiation Oncology

## 2014-04-15 DIAGNOSIS — Z510 Encounter for antineoplastic radiation therapy: Secondary | ICD-10-CM | POA: Diagnosis not present

## 2014-04-16 ENCOUNTER — Ambulatory Visit
Admission: RE | Admit: 2014-04-16 | Discharge: 2014-04-16 | Disposition: A | Discharge status: Home / Self Care | Payer: BC Managed Care – PPO | Source: Ambulatory Visit | Attending: Radiation Oncology | Admitting: Radiation Oncology

## 2014-04-16 DIAGNOSIS — Z510 Encounter for antineoplastic radiation therapy: Secondary | ICD-10-CM | POA: Diagnosis not present

## 2014-04-17 ENCOUNTER — Ambulatory Visit
Admission: RE | Admit: 2014-04-17 | Discharge: 2014-04-17 | Disposition: A | Discharge status: Home / Self Care | Payer: BC Managed Care – PPO | Source: Ambulatory Visit | Attending: Radiation Oncology | Admitting: Radiation Oncology

## 2014-04-17 DIAGNOSIS — Z510 Encounter for antineoplastic radiation therapy: Secondary | ICD-10-CM | POA: Diagnosis not present

## 2014-04-18 ENCOUNTER — Ambulatory Visit
Admission: RE | Admit: 2014-04-18 | Discharge: 2014-04-18 | Disposition: A | Discharge status: Home / Self Care | Payer: BC Managed Care – PPO | Source: Ambulatory Visit | Attending: Radiation Oncology | Admitting: Radiation Oncology

## 2014-04-18 DIAGNOSIS — Z510 Encounter for antineoplastic radiation therapy: Secondary | ICD-10-CM | POA: Diagnosis not present

## 2014-04-21 ENCOUNTER — Ambulatory Visit
Admission: RE | Admit: 2014-04-21 | Discharge: 2014-04-21 | Disposition: A | Discharge status: Home / Self Care | Payer: BC Managed Care – PPO | Source: Ambulatory Visit | Attending: Radiation Oncology | Admitting: Radiation Oncology

## 2014-04-21 ENCOUNTER — Encounter: Payer: Self-pay | Admitting: Radiation Oncology

## 2014-04-21 VITALS — BP 122/62 | HR 64 | Temp 98.0°F | Resp 20 | Wt 231.0 lb

## 2014-04-21 DIAGNOSIS — C61 Malignant neoplasm of prostate: Secondary | ICD-10-CM

## 2014-04-21 DIAGNOSIS — Z510 Encounter for antineoplastic radiation therapy: Secondary | ICD-10-CM | POA: Diagnosis not present

## 2014-04-21 NOTE — Progress Notes (Signed)
Weekly Management Note:  Site: Prostate bed  Current Dose:    5600 cGy Projected Dose: 6600  cGy  Narrative: The patient is seen today for routine under treatment assessment. CBCT/MVCT images/port films were reviewed. The chart was reviewed.   Bladder filling is suboptimal today.  He believes that this is secondary to drinking alcoholic beverages last night and being dehydrated.  No new GU or GI difficulties.  Physical Examination:  Filed Vitals:   04/21/14 0905  BP: 122/62  Pulse: 64  Temp: 98 F (36.7 C)  Resp: 20  .  Weight: 231 lb (104.781 kg).  No change.  Impression: Tolerating radiation therapy well.  He will finish his radiation therapy next Monday.  Plan: Continue radiation therapy as planned.

## 2014-04-21 NOTE — Progress Notes (Signed)
Patient denies pain, fatigue, loss of appetite. He states he continues to have urinary frequency, but denies  other urinary issues. He states his BMs are softer, and he has more than one a day, denies diarrhea.

## 2014-04-22 ENCOUNTER — Ambulatory Visit
Admission: RE | Admit: 2014-04-22 | Discharge: 2014-04-22 | Disposition: A | Discharge status: Home / Self Care | Payer: BC Managed Care – PPO | Source: Ambulatory Visit | Attending: Radiation Oncology | Admitting: Radiation Oncology

## 2014-04-22 DIAGNOSIS — Z510 Encounter for antineoplastic radiation therapy: Secondary | ICD-10-CM | POA: Diagnosis not present

## 2014-04-23 ENCOUNTER — Ambulatory Visit
Admission: RE | Admit: 2014-04-23 | Discharge: 2014-04-23 | Disposition: A | Discharge status: Home / Self Care | Payer: BC Managed Care – PPO | Source: Ambulatory Visit | Attending: Radiation Oncology | Admitting: Radiation Oncology

## 2014-04-23 DIAGNOSIS — Z510 Encounter for antineoplastic radiation therapy: Secondary | ICD-10-CM | POA: Diagnosis not present

## 2014-04-24 ENCOUNTER — Ambulatory Visit
Admission: RE | Admit: 2014-04-24 | Discharge: 2014-04-24 | Disposition: A | Discharge status: Home / Self Care | Payer: BC Managed Care – PPO | Source: Ambulatory Visit | Attending: Radiation Oncology | Admitting: Radiation Oncology

## 2014-04-24 DIAGNOSIS — Z510 Encounter for antineoplastic radiation therapy: Secondary | ICD-10-CM | POA: Diagnosis not present

## 2014-04-24 DIAGNOSIS — C61 Malignant neoplasm of prostate: Secondary | ICD-10-CM

## 2014-04-24 NOTE — Progress Notes (Signed)
Weekly Management Note:  Site: Prostate bed Current Dose:  6200  cGy Projected Dose: 6600  cGy  Narrative: The patient is seen today for routine under treatment assessment. CBCT/MVCT images/port films were reviewed. The chart was reviewed.   Bladder filling has been satisfactory.  No new GU or GI difficulties.  Physical Examination: There were no vitals filed for this visit..  Weight:  .  No change.  Impression: Tolerating radiation therapy well.  Plan: Continue radiation therapy as planned.  He will finish his radiation therapy this Monday and then return to see me for a follow-up visit in one month.

## 2014-04-25 ENCOUNTER — Ambulatory Visit
Admission: RE | Admit: 2014-04-25 | Discharge: 2014-04-25 | Disposition: A | Discharge status: Home / Self Care | Payer: BC Managed Care – PPO | Source: Ambulatory Visit | Attending: Radiation Oncology | Admitting: Radiation Oncology

## 2014-04-25 DIAGNOSIS — Z510 Encounter for antineoplastic radiation therapy: Secondary | ICD-10-CM | POA: Diagnosis not present

## 2014-04-28 ENCOUNTER — Ambulatory Visit: Payer: BC Managed Care – PPO

## 2014-04-29 ENCOUNTER — Ambulatory Visit
Admission: RE | Admit: 2014-04-29 | Discharge: 2014-04-29 | Disposition: A | Discharge status: Home / Self Care | Payer: BC Managed Care – PPO | Source: Ambulatory Visit | Attending: Radiation Oncology | Admitting: Radiation Oncology

## 2014-04-29 DIAGNOSIS — Z510 Encounter for antineoplastic radiation therapy: Secondary | ICD-10-CM | POA: Diagnosis not present

## 2014-05-03 ENCOUNTER — Encounter: Payer: Self-pay | Admitting: Radiation Oncology

## 2014-05-03 NOTE — Progress Notes (Signed)
[facility] End of Treatment Note  Name:Christian Ward  Date: 05/03/2014 TFT:[id number] DOB:1950/12/12   Status:outpatient    CC: Noralee Space, MD  Dr. Lawerance Bach III  REFERRING PHYSICIAN:  Dr. Lawerance Bach III    DIAGNOSIS:  PSA recurrent carcinoma the prostate, stage  pT3a   INDICATION FOR TREATMENT: Curative   TREATMENT DATES: 03/12/2014 through 04/29/2014                          SITE/DOSE:    Prostate bed 6600 cGy in 33 sessions                        BEAMS/ENERGY:     6 MV photons with dual ARC VMAT IMRT              NARRATIVE:    The patient tolerated his treatment well with no significant GU or GI toxicity by completion of therapy.                        PLAN: Routine followup in one month. Patient instructed to call if questions or worsening complaints in interim.

## 2014-05-23 ENCOUNTER — Telehealth: Payer: Self-pay | Admitting: *Deleted

## 2014-05-23 NOTE — Telephone Encounter (Signed)
CALLED PATIENT TO ASK ABOUT MOVING HIS FU VISIT FROM 05-27-14 TO 05-29-14 @ 1 PM, LVM PER DR. Valere Dross REGARDING MOVING THIS APPT.

## 2014-05-27 ENCOUNTER — Ambulatory Visit: Payer: BC Managed Care – PPO | Admitting: Radiation Oncology

## 2014-05-28 ENCOUNTER — Encounter: Payer: Self-pay | Admitting: Radiation Oncology

## 2014-05-29 ENCOUNTER — Ambulatory Visit
Admission: RE | Admit: 2014-05-29 | Discharge: 2014-05-29 | Disposition: A | Discharge status: Home / Self Care | Payer: BC Managed Care – PPO | Source: Ambulatory Visit | Attending: Radiation Oncology | Admitting: Radiation Oncology

## 2014-05-29 ENCOUNTER — Encounter: Payer: Self-pay | Admitting: Radiation Oncology

## 2014-05-29 VITALS — BP 148/69 | HR 61 | Temp 97.8°F | Resp 12 | Wt 231.3 lb

## 2014-05-29 DIAGNOSIS — C61 Malignant neoplasm of prostate: Secondary | ICD-10-CM

## 2014-05-29 HISTORY — DX: Personal history of irradiation: Z92.3

## 2014-05-29 NOTE — Progress Notes (Signed)
He is currently in no pain. Pt reports urinary frequency and retention. Pt states they urinate 2 - 3 times per night.  Pt reports soft bowel movement every day. BP 148/69 mmHg  Pulse 61  Temp(Src) 97.8 F (36.6 C) (Oral)  Resp 12  Wt 231 lb 4.8 oz (104.917 kg)  SpO2 100%

## 2014-05-29 NOTE — Progress Notes (Signed)
CC: Dr. Lawerance Bach  Follow-up note:  Mr. Bhargava returns today approximately 1 month following completion of external beam/IMRT in the management of his PSA recurrent carcinoma the prostate, pathologic stage T3a N0.  He is   doing well from a GU and GI standpoint.  He does have nocturia 2-3 with his baseline prior to radiation therapy being 1-2.  He admits that he drinks plenty of fluid which does increase his nocturia.  He does not yet have a follow-up appointment with Dr. Rosana Hoes.  Physical examination: Alert and oriented. Filed Vitals:   05/29/14 1552  BP: 148/69  Pulse: 61  Temp: 97.8 F (36.6 C)  Resp: 12   Rectal examination not performed today.  Impression: Satisfactory progress.  I explained to Mr. Keidel that he should see Dr. Rosana Hoes in approximately 2 months for a follow-up visit and PSA determination.  Plan: As above.  I ask that Dr. Rosana Hoes keep me posted on his progress and PSA determinations.

## 2014-06-13 ENCOUNTER — Ambulatory Visit
Admission: RE | Admit: 2014-06-13 | Discharge: 2014-06-13 | Disposition: A | Discharge status: Home / Self Care | Payer: BC Managed Care – PPO | Source: Ambulatory Visit | Attending: Neurological Surgery | Admitting: Neurological Surgery

## 2014-06-13 ENCOUNTER — Other Ambulatory Visit: Payer: Self-pay | Admitting: Neurological Surgery

## 2014-06-13 DIAGNOSIS — M545 Low back pain: Secondary | ICD-10-CM

## 2014-08-14 ENCOUNTER — Telehealth: Payer: Self-pay | Admitting: *Deleted

## 2014-08-14 NOTE — Telephone Encounter (Signed)
Returned Designer, television/film set from Mr Kainz. He states that he saw Dr Leander Rams on 08/08/14 and had PSA drawn. He states his results were 0.0. He requested Dr Valere Dross be made aware. Informed him this caller would route his report to Dr Valere Dross.

## 2015-02-27 ENCOUNTER — Other Ambulatory Visit: Payer: Self-pay | Admitting: Orthopedic Surgery

## 2015-02-27 DIAGNOSIS — M533 Sacrococcygeal disorders, not elsewhere classified: Secondary | ICD-10-CM

## 2015-03-04 ENCOUNTER — Telehealth: Payer: Self-pay | Admitting: Pulmonary Disease

## 2015-03-04 NOTE — Telephone Encounter (Signed)
Will call back tomorrow

## 2015-03-04 NOTE — Telephone Encounter (Signed)
Patient would like to hear something tomorrow.

## 2015-03-06 NOTE — Telephone Encounter (Signed)
Christian Ward did you speak w/ SN regarding pt?

## 2015-03-06 NOTE — Telephone Encounter (Signed)
Called and spoke with pt Pt stated that due to insurance change he has to change his PCP from SN to someone with [facility] Pt asked if all the providers from the practice would be covered with his insurance I advised pt to contact primary care directly and ask since we have no way of knowing for sure Pt also asked if I or SN rec anyone in particular downstairs Informed pt that SN feels that any dr from this office is capable of overtaking his care Pt stated that he was going to talk to new office about this because he had a few preferences that he wanted with new PCP  Nothing further is needed at this time

## 2015-03-09 ENCOUNTER — Telehealth: Payer: Self-pay | Admitting: Internal Medicine

## 2015-03-09 NOTE — Telephone Encounter (Signed)
[name] gave ok to transfer from [place].  Patient will call back in January to schedule appointment.

## 2015-03-13 ENCOUNTER — Ambulatory Visit
Admission: RE | Admit: 2015-03-13 | Discharge: 2015-03-13 | Disposition: A | Discharge status: Home / Self Care | Payer: BC Managed Care – PPO | Source: Ambulatory Visit | Attending: Orthopedic Surgery | Admitting: Orthopedic Surgery

## 2015-03-13 ENCOUNTER — Other Ambulatory Visit: Payer: BC Managed Care – PPO

## 2015-03-13 DIAGNOSIS — M533 Sacrococcygeal disorders, not elsewhere classified: Secondary | ICD-10-CM

## 2015-08-05 ENCOUNTER — Telehealth: Payer: Self-pay | Admitting: Pulmonary Disease

## 2015-08-05 NOTE — Telephone Encounter (Signed)
Pt called stating he is suffering what he thinks is the stomach flu with a low grade fever. You agreed to be his PCP. You have not seen him yet but he is wanting to know if he can still come in to see you for this. You have a couple openings tomorrow and wondering if you can see him.  If so, he is wondering if he should get some lab orders in today to "get the ball rolling" Please advise

## 2015-08-06 ENCOUNTER — Ambulatory Visit (INDEPENDENT_AMBULATORY_CARE_PROVIDER_SITE_OTHER)
Admission: RE | Admit: 2015-08-06 | Discharge: 2015-08-06 | Disposition: A | Discharge status: Home / Self Care | Payer: BC Managed Care – PPO | Source: Ambulatory Visit | Attending: Internal Medicine | Admitting: Internal Medicine

## 2015-08-06 ENCOUNTER — Ambulatory Visit (INDEPENDENT_AMBULATORY_CARE_PROVIDER_SITE_OTHER): Payer: BC Managed Care – PPO | Admitting: Internal Medicine

## 2015-08-06 ENCOUNTER — Other Ambulatory Visit (INDEPENDENT_AMBULATORY_CARE_PROVIDER_SITE_OTHER): Payer: BC Managed Care – PPO

## 2015-08-06 ENCOUNTER — Encounter: Payer: Self-pay | Admitting: Internal Medicine

## 2015-08-06 VITALS — BP 110/80 | HR 80 | Temp 98.2°F | Resp 16 | Ht 76.0 in | Wt 220.0 lb

## 2015-08-06 DIAGNOSIS — R05 Cough: Secondary | ICD-10-CM | POA: Insufficient documentation

## 2015-08-06 DIAGNOSIS — R110 Nausea: Secondary | ICD-10-CM

## 2015-08-06 DIAGNOSIS — R059 Cough, unspecified: Secondary | ICD-10-CM

## 2015-08-06 DIAGNOSIS — B9789 Other viral agents as the cause of diseases classified elsewhere: Secondary | ICD-10-CM

## 2015-08-06 DIAGNOSIS — K5909 Other constipation: Secondary | ICD-10-CM | POA: Diagnosis not present

## 2015-08-06 DIAGNOSIS — J069 Acute upper respiratory infection, unspecified: Secondary | ICD-10-CM

## 2015-08-06 DIAGNOSIS — K581 Irritable bowel syndrome with constipation: Secondary | ICD-10-CM | POA: Diagnosis not present

## 2015-08-06 DIAGNOSIS — K5900 Constipation, unspecified: Secondary | ICD-10-CM | POA: Insufficient documentation

## 2015-08-06 LAB — CBC WITH DIFFERENTIAL/PLATELET
BASOS PCT: 0.5 % (ref 0.0–3.0)
Basophils Absolute: 0 10*3/uL (ref 0.0–0.1)
EOS PCT: 1.1 % (ref 0.0–5.0)
Eosinophils Absolute: 0.1 10*3/uL (ref 0.0–0.7)
HCT: 40.6 % (ref 39.0–52.0)
Hemoglobin: 13.9 g/dL (ref 13.0–17.0)
LYMPHS ABS: 1 10*3/uL (ref 0.7–4.0)
Lymphocytes Relative: 19.4 % (ref 12.0–46.0)
MCHC: 34.2 g/dL (ref 30.0–36.0)
MCV: 89 fl (ref 78.0–100.0)
MONO ABS: 0.5 10*3/uL (ref 0.1–1.0)
Monocytes Relative: 9.4 % (ref 3.0–12.0)
NEUTROS ABS: 3.7 10*3/uL (ref 1.4–7.7)
NEUTROS PCT: 69.6 % (ref 43.0–77.0)
Platelets: 155 10*3/uL (ref 150.0–400.0)
RBC: 4.56 Mil/uL (ref 4.22–5.81)
RDW: 13.2 % (ref 11.5–15.5)
WBC: 5.3 10*3/uL (ref 4.0–10.5)

## 2015-08-06 LAB — COMPREHENSIVE METABOLIC PANEL
ALT: 47 U/L (ref 0–53)
AST: 32 U/L (ref 0–37)
Albumin: 4.3 g/dL (ref 3.5–5.2)
Alkaline Phosphatase: 73 U/L (ref 39–117)
BUN: 14 mg/dL (ref 6–23)
CHLORIDE: 105 meq/L (ref 96–112)
CO2: 30 meq/L (ref 19–32)
Calcium: 9.7 mg/dL (ref 8.4–10.5)
Creatinine, Ser: 1.12 mg/dL (ref 0.40–1.50)
GFR: 70.14 mL/min (ref >60.00)
GLUCOSE: 92 mg/dL (ref 70–99)
POTASSIUM: 3.6 meq/L (ref 3.5–5.1)
SODIUM: 141 meq/L (ref 135–145)
TOTAL PROTEIN: 7.1 g/dL (ref 6.0–8.3)
Total Bilirubin: 1.5 mg/dL — ABNORMAL HIGH (ref 0.2–1.2)

## 2015-08-06 LAB — LIPASE: Lipase: 22 U/L (ref 11.0–59.0)

## 2015-08-06 LAB — SEDIMENTATION RATE: SED RATE: 17 mm/h (ref 0–22)

## 2015-08-06 LAB — AMYLASE: AMYLASE: 26 U/L — AB (ref 27–131)

## 2015-08-06 MED ORDER — PROMETHAZINE-DM 6.25-15 MG/5ML PO SYRP: 5.0000 mL | ORAL_SOLUTION | Freq: Four times a day (QID) | ORAL | Status: DC | PRN

## 2015-08-06 MED ORDER — LUBIPROSTONE 24 MCG PO CAPS: 24.0000 ug | ORAL_CAPSULE | Freq: Two times a day (BID) | ORAL | Status: DC

## 2015-08-06 NOTE — Patient Instructions (Signed)

## 2015-08-06 NOTE — Progress Notes (Signed)
Pre visit review using our clinic review tool, if applicable. No additional management support is needed unless otherwise documented below in the visit note. 

## 2015-08-06 NOTE — Progress Notes (Signed)
Subjective:  Patient ID: Christian Ward, male    DOB: [birth year]  Age: 65 y.o. MRN: [id number]  CC: Cough  NEW TO ME  HPI KANARD FORS presents for a 10 day history of nonproductive cough, low-grade fever, chills, fatigue, sinus pressure, muscle aches, nausea, but no vomiting, abdominal pain, or diarrhea. He does complain of chronic constipation which hass worsened over the last few weeks.  Outpatient Prescriptions Prior to Visit  Medication Sig Dispense Refill  . Ascorbic Acid (VITAMIN C) 1000 MG tablet Take 1,000 mg by mouth daily. Reported on 08/06/2015    . fluticasone (FLONASE) 50 MCG/ACT nasal spray Place 1 spray into both nostrils daily.    . Multiple Vitamin (MULTIVITAMIN) tablet Take 1 tablet by mouth daily.    [name] omeprazole (PRILOSEC) 20 MG capsule Take 20 mg by mouth 2 (two) times daily.     . ondansetron (ZOFRAN) 4 MG tablet Take 4 mg by mouth every 8 (eight) hours as needed. For nausea    . Probiotic Product (PROBIOTIC DAILY) CAPS Take 1 capsule by mouth daily.    . celecoxib (CELEBREX) 200 MG capsule Take 200 mg by mouth 2 (two) times daily.     . polyethylene glycol powder (MIRALAX) powder Take 8.5 g by mouth 2 (two) times a week.     [name] ibuprofen (ADVIL,MOTRIN) 200 MG tablet Take 200 mg by mouth every 6 (six) hours as needed. Reported on 08/06/2015    . imipramine (TOFRANIL) 25 MG tablet TAKE 1 TABLET BY MOUTH EVERY DAY (Patient not taking: Reported on 08/06/2015) 30 tablet 0  . promethazine (PHENERGAN) 12.5 MG tablet Take 12.5 mg by mouth every 6 (six) hours as needed.     No facility-administered medications prior to visit.    ROS Review of Systems  Constitutional: Positive for fever, chills and fatigue. Negative for diaphoresis, appetite change and unexpected weight change.  HENT: Positive for sinus pressure and sore throat. Negative for congestion, facial swelling, postnasal drip, sneezing, tinnitus, trouble swallowing and voice change.   Eyes: Negative.  Negative  for visual disturbance.  Respiratory: Positive for cough. Negative for choking, chest tightness, shortness of breath, wheezing and stridor.   Cardiovascular: Negative.  Negative for chest pain, palpitations and leg swelling.  Gastrointestinal: Positive for nausea and constipation. Negative for vomiting, abdominal pain, diarrhea and blood in stool.  Endocrine: Negative.   Genitourinary: Negative.   Musculoskeletal: Positive for myalgias. Negative for back pain, joint swelling and neck pain.  Skin: Negative.  Negative for color change, pallor and rash.  Allergic/Immunologic: Negative.   Neurological: Negative.   Hematological: Negative.  Negative for adenopathy. Does not bruise/bleed easily.  Psychiatric/Behavioral: Negative.     Objective:  BP 110/80 mmHg  Pulse 80  Temp(Src) 98.2 F (36.8 C) (Oral)  Resp 16  Ht 6\' 4"  (1.93 m)  Wt 220 lb (99.791 kg)  BMI 26.79 kg/m2  SpO2 99%  BP Readings from Last 3 Encounters:  08/06/15 110/80  05/29/14 148/69  04/21/14 122/62    Wt Readings from Last 3 Encounters:  08/06/15 220 lb (99.791 kg)  05/29/14 231 lb 4.8 oz (104.917 kg)  04/21/14 231 lb (104.781 kg)    Physical Exam  Constitutional: He is oriented to person, place, and time. He appears well-developed and well-nourished.  Non-toxic appearance. He does not have a sickly appearance. He does not appear ill. No distress.  HENT:  Head: Normocephalic and atraumatic.  Mouth/Throat: Oropharynx is clear and moist and mucous membranes  are normal. Mucous membranes are not pale, not dry and not cyanotic. No oral lesions. No trismus in the jaw. No uvula swelling. No oropharyngeal exudate, posterior oropharyngeal edema, posterior oropharyngeal erythema or tonsillar abscesses.  Eyes: Conjunctivae are normal. Right eye exhibits no discharge. Left eye exhibits no discharge. No scleral icterus.  Neck: Normal range of motion. Neck supple. No JVD present. No tracheal deviation present. No thyromegaly  present.  Cardiovascular: Normal rate, regular rhythm, normal heart sounds and intact distal pulses.  Exam reveals no gallop and no friction rub.   No murmur heard. Pulmonary/Chest: Effort normal and breath sounds normal. No stridor. No respiratory distress. He has no wheezes. He has no rales. He exhibits no tenderness.  Abdominal: Soft. Normal appearance and bowel sounds are normal. He exhibits no distension and no mass. There is no hepatosplenomegaly. There is no tenderness. There is no rebound and no guarding.  Musculoskeletal: Normal range of motion. He exhibits no edema or tenderness.  Lymphadenopathy:    He has no cervical adenopathy.  Neurological: He is oriented to person, place, and time.  Skin: Skin is warm and dry. No rash noted. He is not diaphoretic. No erythema. No pallor.  Vitals reviewed.   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 Chronic sacroiliac joint pain. Prostate cancer diagnosed in 2011 status post prostatectomy. Status post radiation therapy for elevated PSA in 2015. EXAM: CT PELVIS WITHOUT CONTRAST TECHNIQUE: Multidetector CT imaging of the pelvis was performed following the standard protocol without intravenous contrast. COMPARISON:  12/17/2013 pelvic radiographs. FINDINGS: Reproductive: Status post prostatectomy. No discrete mass or fluid collection in the prostatectomy bed. Bladder: Collapsed and grossly normal. No distal ureterectasis . Bowel: Visualized  small and large bowel are normal caliber with no bowel wall thickening. Vascular/Lymphatic: No pathologically enlarged lymph nodes in the pelvis. Other: No pneumoperitoneum, ascites or focal fluid collection. Musculoskeletal: No aggressive appearing focal osseous lesions. No fracture. Symmetric tiny femoral head collar osteophytes and mild marginal superior acetabular hypertrophic change in the bilateral hip joints. There is partial ankylosis of the sacroiliac joints bilaterally, predominantly in the anterior superior aspects of the sacroiliac joints, with associated moderate productive osseous changes at the anterior superior margins of the sacroiliac joints. No active erosions are seen in the sacroiliac joints. No sacroiliac joint diastasis. Moderate degenerative disc disease at L5-S1. There is facet arthropathy bilaterally at L5-S1, asymmetric to the right, with mild foraminal stenosis on the right at L5-S1. IMPRESSION: 1. 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 No active SI joint erosions. 2. Moderate degenerative disc disease at L5-S1. Bilateral L5-S1 facet arthropathy, asymmetric to the right, with mild foraminal stenosis on the right at L5-S1. 3. Mild bilateral hip osteoarthritis. 4. Status post prostatectomy, with no abnormal findings in the prostatectomy bed. Electronically Signed   By: Corene Cornea  A Poff M.D.   On: 03/13/2015 20:58    Assessment & Plan:   Ermal was seen today for cough.  Diagnoses and all orders for this visit:  Cough- his chest x-rays normal, his signs and symptoms are consistent with a viral upper respiratory infection and will treat accordingly -     DG Abd Acute W/Chest; Future  Nausea without vomiting- his exams and labs do not show any acute organic illness, will treat the constipation as well as the upper respiratory infection -     Lipase; Future -     Amylase; Future -      Comprehensive metabolic panel; Future -     CBC with Differential/Platelet; Future -     Sedimentation rate; Future -     DG Abd Acute W/Chest; Future  Irritable bowel syndrome with constipation- will start Amitiza for symptom relief. -     lubiprostone (AMITIZA) 24 MCG capsule; Take 1 capsule (24 mcg total) by mouth 2 (two) times daily with a meal.  Other constipation- will start Amitiza -     lubiprostone (AMITIZA) 24 MCG capsule; Take 1 capsule (24 mcg total) by mouth 2 (two) times daily with a meal.  Viral URI with cough- this is viral so antibiotics are not indicated, will treat the symptoms with Phenergan DM. -     promethazine-dextromethorphan (PROMETHAZINE-DM) 6.25-15 MG/5ML syrup; Take 5 mLs by mouth 4 (four) times daily as needed for cough.   I have discontinued Mr. Naba polyethylene glycol powder, promethazine, celecoxib, and imipramine. I am also having him start on lubiprostone and promethazine-dextromethorphan. Additionally, I am having him maintain his ondansetron, omeprazole, ibuprofen, multivitamin, vitamin C, PROBIOTIC DAILY, and fluticasone.  Meds ordered this encounter  Medications  . lubiprostone (AMITIZA) 24 MCG capsule    Sig: Take 1 capsule (24 mcg total) by mouth 2 (two) times daily with a meal.    Dispense:  60 capsule    Refill:  5  . promethazine-dextromethorphan (PROMETHAZINE-DM) 6.25-15 MG/5ML syrup    Sig: Take 5 mLs by mouth 4 (four) times daily as needed for cough.    Dispense:  118 mL    Refill:  0     Follow-up: Return in about 3 weeks (around 08/27/2015).  Scarlette Calico, MD

## 2016-05-07 ENCOUNTER — Ambulatory Visit (INDEPENDENT_AMBULATORY_CARE_PROVIDER_SITE_OTHER): Payer: BC Managed Care – PPO | Admitting: Family Medicine

## 2016-05-07 ENCOUNTER — Encounter: Payer: Self-pay | Admitting: Family Medicine

## 2016-05-07 VITALS — BP 116/84 | HR 100 | Temp 98.2°F | Resp 18 | Wt 218.0 lb

## 2016-05-07 DIAGNOSIS — J019 Acute sinusitis, unspecified: Secondary | ICD-10-CM

## 2016-05-07 MED ORDER — AZITHROMYCIN 250 MG PO TABS: ORAL_TABLET | ORAL | 0 refills | Status: DC

## 2016-05-07 MED ORDER — HYDROCODONE-HOMATROPINE 5-1.5 MG/5ML PO SYRP: 5.0000 mL | ORAL_SOLUTION | ORAL | 0 refills | Status: DC | PRN

## 2016-05-07 MED ORDER — METHYLPREDNISOLONE 4 MG PO TBPK: ORAL_TABLET | ORAL | 0 refills | Status: DC

## 2016-05-07 NOTE — Progress Notes (Signed)
Pre visit review using our clinic review tool, if applicable. No additional management support is needed unless otherwise documented below in the visit note. 

## 2016-05-07 NOTE — Progress Notes (Signed)
   Subjective:    Patient ID: EMMERIC AVETISYAN, male    DOB: 06-14-1950, 65 y.o.   MRN: [id number]  HPI Here for 4 days of sinus pressure, headache, PND, blowing green mucus form the nose, and coughing up green mucus. No fever.    Review of Systems  Constitutional: Negative.   HENT: Positive for congestion, postnasal drip, sinus pain and sinus pressure. Negative for ear pain and sore throat.   Eyes: Negative.   Respiratory: Positive for cough.        Objective:   Physical Exam  Constitutional: He appears well-developed and well-nourished.  HENT:  Right Ear: External ear normal.  Left Ear: External ear normal.  Nose: Nose normal.  Mouth/Throat: Oropharynx is clear and moist.  Eyes: Conjunctivae are normal.  Neck: No thyromegaly present.  Pulmonary/Chest: Effort normal and breath sounds normal.  Lymphadenopathy:    He has no cervical adenopathy.          Assessment & Plan:  Sinusitis, treat with a Zpack and a Medrol dose pack. Add Mucinex prn.  Alysia Penna, MD

## 2016-07-12 DIAGNOSIS — M549 Dorsalgia, unspecified: Secondary | ICD-10-CM | POA: Diagnosis not present

## 2016-07-12 DIAGNOSIS — R42 Dizziness and giddiness: Secondary | ICD-10-CM | POA: Diagnosis not present

## 2016-07-12 DIAGNOSIS — K219 Gastro-esophageal reflux disease without esophagitis: Secondary | ICD-10-CM | POA: Diagnosis not present

## 2016-07-12 DIAGNOSIS — G8929 Other chronic pain: Secondary | ICD-10-CM | POA: Diagnosis not present

## 2016-07-12 LAB — CBC AND DIFFERENTIAL
HCT: 40 % — AB (ref 41–53)
HEMOGLOBIN: 13.4 g/dL — AB (ref 13.5–17.5)
Platelets: 198 10*3/uL (ref 150–399)
WBC: 9.1 10^3/mL

## 2016-07-12 LAB — BASIC METABOLIC PANEL
BUN: 24 mg/dL — AB (ref 4–21)
Creatinine: 1.1 mg/dL (ref 0.6–1.3)
GLUCOSE: 91 mg/dL
POTASSIUM: 4.5 mmol/L (ref 3.4–5.3)
SODIUM: 141 mmol/L (ref 137–147)

## 2016-07-13 ENCOUNTER — Telehealth: Payer: Self-pay | Admitting: Internal Medicine

## 2016-07-13 NOTE — Telephone Encounter (Signed)
Patient is in [place] right now.  He had to go to [place] because he was feeling nauseas and faint.  Provider there determined that it was more than likely High BP and that it could be related to stress.  [facility] advised patient to follow up with [name].  Patient will be back in town tomorrow (3/8) and is requesting to be worked in.  Please advise.

## 2016-07-13 NOTE — Telephone Encounter (Signed)
I have scheduled patient for 11:15am for 3/8.  Patient would like to make sure that [name] can access his records from [place] through "[facility]" in [place].

## 2016-07-13 NOTE — Telephone Encounter (Signed)
Yes, I will see him tomorrow.

## 2016-07-14 ENCOUNTER — Encounter: Payer: Self-pay | Admitting: Internal Medicine

## 2016-07-14 ENCOUNTER — Ambulatory Visit (INDEPENDENT_AMBULATORY_CARE_PROVIDER_SITE_OTHER): Payer: 59 | Admitting: Internal Medicine

## 2016-07-14 VITALS — BP 140/84 | HR 84 | Temp 97.9°F | Resp 16 | Ht 76.0 in | Wt 220.0 lb

## 2016-07-14 DIAGNOSIS — F418 Other specified anxiety disorders: Secondary | ICD-10-CM | POA: Diagnosis not present

## 2016-07-14 DIAGNOSIS — I10 Essential (primary) hypertension: Secondary | ICD-10-CM | POA: Diagnosis not present

## 2016-07-14 MED ORDER — VORTIOXETINE HBR 5 MG PO TABS: 1.0000 | ORAL_TABLET | Freq: Every day | ORAL | 0 refills | Status: DC

## 2016-07-14 NOTE — Progress Notes (Signed)
Pre visit review using our clinic review tool, if applicable. No additional management support is needed unless otherwise documented below in the visit note. 

## 2016-07-14 NOTE — Progress Notes (Signed)
Subjective:  Patient ID: Christian Ward, male    DOB: 10/03/1950  Age: 66 y.o. MRN: [id number]  CC: Hypertension   HPI ANIS DEGIDIO presents for Recheck after being seen at an emergency room in [place] a couple of days ago. He had several days of feeling anxious, nervous, and dizzy and was seen at the ER and was told that his blood pressure was up to about 180/85.  His labs were normal and several cardiac enzymes were negative. He is under quite a bit of stress because he works in [employer] and his wife declinning neurologically here in [place] and it is his responsibility take care of her. He complains of insomnia with frequent awakenings, anhedonia, and crying spells.  Outpatient Medications Prior to Visit  Medication Sig Dispense Refill  . Ascorbic Acid (VITAMIN C) 1000 MG tablet Take 1,000 mg by mouth daily. Reported on 08/06/2015    . fluticasone (FLONASE) 50 MCG/ACT nasal spray Place 1 spray into both nostrils daily.    [name] omeprazole (PRILOSEC) 20 MG capsule Take 20 mg by mouth 2 (two) times daily.     . Probiotic Product (PROBIOTIC DAILY) CAPS Take 1 capsule by mouth daily.    [name] ibuprofen (ADVIL,MOTRIN) 200 MG tablet Take 200 mg by mouth every 6 (six) hours as needed. Reported on 08/06/2015    . Multiple Vitamin (MULTIVITAMIN) tablet Take 1 tablet by mouth daily.    . ondansetron (ZOFRAN) 4 MG tablet Take 4 mg by mouth every 8 (eight) hours as needed. For nausea    . azithromycin (ZITHROMAX) 250 MG tablet As directed 6 tablet 0  . HYDROcodone-homatropine (HYDROMET) 5-1.5 MG/5ML syrup Take 5 mLs by mouth every 4 (four) hours as needed. 240 mL 0  . lubiprostone (AMITIZA) 24 MCG capsule Take 1 capsule (24 mcg total) by mouth 2 (two) times daily with a meal. 60 capsule 5  . methylPREDNISolone (MEDROL DOSEPAK) 4 MG TBPK tablet As directed 21 tablet 0  . promethazine-dextromethorphan (PROMETHAZINE-DM) 6.25-15 MG/5ML syrup Take 5 mLs by mouth 4 (four) times daily as needed for cough. 118  mL 0   No facility-administered medications prior to visit.     ROS Review of Systems  Constitutional: Negative for appetite change, chills, diaphoresis, fatigue and unexpected weight change.  HENT: Negative.  Negative for trouble swallowing.   Eyes: Negative for visual disturbance.  Respiratory: Negative for cough, chest tightness, shortness of breath and wheezing.   Cardiovascular: Negative for chest pain, palpitations and leg swelling.  Gastrointestinal: Negative for abdominal pain, constipation, diarrhea, nausea and vomiting.  Endocrine: Negative.   Genitourinary: Negative.  Negative for difficulty urinating.  Musculoskeletal: Negative.  Negative for back pain, neck pain and neck stiffness.  Skin: Negative.  Negative for rash.  Allergic/Immunologic: Negative.   Neurological: Positive for dizziness. Negative for tremors, syncope, weakness, numbness and headaches.  Hematological: Negative for adenopathy. Does not bruise/bleed easily.  Psychiatric/Behavioral: Positive for dysphoric mood and sleep disturbance. Negative for agitation, behavioral problems, confusion, hallucinations and self-injury. The patient is nervous/anxious. The patient is not hyperactive.     Objective:  BP 140/84 (BP Location: Left Arm, Patient Position: Sitting, Cuff Size: Normal)   Pulse 84   Temp 97.9 F (36.6 C) (Oral)   Resp 16   Ht 6\' 4"  (1.93 m)   Wt 220 lb (99.8 kg)   SpO2 98%   BMI 26.78 kg/m   BP Readings from Last 3 Encounters:  07/14/16 140/84  05/07/16 116/84  08/06/15 110/80  Wt Readings from Last 3 Encounters:  07/14/16 220 lb (99.8 kg)  05/07/16 218 lb (98.9 kg)  08/06/15 220 lb (99.8 kg)    Physical Exam  Constitutional: He is oriented to person, place, and time. No distress.  HENT:  Mouth/Throat: Oropharynx is clear and moist. No oropharyngeal exudate.  Eyes: Conjunctivae are normal. Right eye exhibits no discharge. Left eye exhibits no discharge. No scleral icterus.  Neck:  Normal range of motion. Neck supple. No JVD present. No tracheal deviation present. No thyromegaly present.  Cardiovascular: Normal rate, regular rhythm, normal heart sounds and intact distal pulses.  Exam reveals no gallop and no friction rub.   No murmur heard. Pulmonary/Chest: Effort normal and breath sounds normal. No stridor. No respiratory distress. He has no wheezes. He has no rales. He exhibits no tenderness.  Abdominal: Soft. Bowel sounds are normal. He exhibits no distension and no mass. There is no tenderness. There is no rebound and no guarding.  Musculoskeletal: Normal range of motion. He exhibits no edema, tenderness or deformity.  Lymphadenopathy:    He has no cervical adenopathy.  Neurological: He is oriented to person, place, and time.  Skin: Skin is warm and dry. No rash noted. He is not diaphoretic. No erythema. No pallor.  Psychiatric: Judgment normal. His mood appears not anxious. His affect is not angry. His speech is not rapid and/or pressured, not delayed and not tangential. He is not slowed and not withdrawn. Cognition and memory are normal. He exhibits a depressed mood. He expresses no homicidal and no suicidal ideation. He expresses no suicidal plans and no homicidal plans.  sad He is attentive.  Vitals reviewed.   Lab Results  Component Value Date   WBC 9.1 07/12/2016   HGB 13.4 (A) 07/12/2016   HCT 40 (A) 07/12/2016   PLT 198 07/12/2016   GLUCOSE 92 08/06/2015   CHOL 159 03/14/2014   TRIG 65.0 03/14/2014   HDL 32.00 (L) 03/14/2014   LDLCALC 114 (H) 03/14/2014   ALT 47 08/06/2015   AST 32 08/06/2015   NA 141 07/12/2016   K 4.5 07/12/2016   CL 105 08/06/2015   CREATININE 1.1 07/12/2016   BUN 24 (A) 07/12/2016   CO2 30 08/06/2015   TSH 1.42 03/14/2014   PSA 12.47 (H) 04/07/2009    Dg Abd Acute W/chest  Result Date: 08/06/2015 CLINICAL DATA:  Six day history of cough, congestion, nausea and constipation. EXAM: DG ABDOMEN ACUTE W/ 1V CHEST COMPARISON:   Chest x-ray 03/17/2014. FINDINGS: The upright chest x-ray is normal and stable. Two views of the abdomen demonstrate large amount of stool throughout the colon suggesting constipation. No small bowel obstruction or free air. The soft tissue shadows are maintained. The bony structures are intact. IMPRESSION: Normal chest x-ray. Large amount of stool throughout the colon suggesting constipation. No findings for small bowel obstruction or free air. Electronically Signed   By: Marijo Sanes M.D.   On: 08/06/2015 10:54    Assessment & Plan:   Sayeed was seen today for hypertension.  Diagnoses and all orders for this visit:  Depression with anxiety- will start trintellix at 5 mg QD, I will titrate up to 20 mg QD based on his response and tolerance -     vortioxetine HBr (TRINTELLIX) 5 MG TABS; Take 1 tablet (5 mg total) by mouth daily.  Essential hypertension- His BP is mildly elevated but he is not willing to take an antihypertensive at this time, he will work on his  lifestyle modifications   I have discontinued Mr. Arp ondansetron, ibuprofen, multivitamin, lubiprostone, promethazine-dextromethorphan, azithromycin, methylPREDNISolone, and HYDROcodone-homatropine. I am also having him start on vortioxetine HBr. Additionally, I am having him maintain his omeprazole, vitamin C, PROBIOTIC DAILY, and fluticasone.  Meds ordered this encounter  Medications  . vortioxetine HBr (TRINTELLIX) 5 MG TABS    Sig: Take 1 tablet (5 mg total) by mouth daily.    Dispense:  42 tablet    Refill:  0     Follow-up: Return in about 6 weeks (around 08/25/2016).  Scarlette Calico, MD

## 2016-07-14 NOTE — Patient Instructions (Signed)
Major Depressive Disorder, Adult Major depressive disorder (MDD) is a mental health condition. It may also be called clinical depression or unipolar depression. MDD usually causes feelings of sadness, hopelessness, or helplessness. MDD can also cause physical symptoms. It can interfere with work, school, relationships, and other everyday activities. MDD may be mild, moderate, or severe. It may occur once (single episode major depressive disorder) or it may occur multiple times (recurrent major depressive disorder). What are the causes? The exact cause of this condition is not known. MDD is most likely caused by a combination of things, which may include:  Genetic factors. These are traits that are passed along from parent to child.  Individual factors. Your personality, your behavior, and the way you handle your thoughts and feelings may contribute to MDD. This includes personality traits and behaviors learned from others.  Physical factors, such as: ? Differences in the part of your brain that controls emotion. This part of your brain may be different than it is in people who do not have MDD. ? Long-term (chronic) medical or psychiatric illnesses.  Social factors. Traumatic experiences or major life changes may play a role in the development of MDD.  What increases the risk? This condition is more likely to develop in women. The following factors may also make you more likely to develop MDD:  A family history of depression.  Troubled family relationships.  Abnormally low levels of certain brain chemicals.  Traumatic events in childhood, especially abuse or the loss of a parent.  Being under a lot of stress, or long-term stress, especially from upsetting life experiences or losses.  A history of: ? Chronic physical illness. ? Other mental health disorders. ? Substance abuse.  Poor living conditions.  Experiencing social exclusion or discrimination on a regular basis.  What are  the signs or symptoms? The main symptoms of MDD typically include:  Constant depressed or irritable mood.  Loss of interest in things and activities.  MDD symptoms may also include:  Sleeping or eating too much or too little.  Unexplained weight change.  Fatigue or low energy.  Feelings of worthlessness or guilt.  Difficulty thinking clearly or making decisions.  Thoughts of suicide or of harming others.  Physical agitation or weakness.  Isolation.  Severe cases of MDD may also occur with other symptoms, such as:  Delusions or hallucinations, in which you imagine things that are not real (psychotic depression).  Low-level depression that lasts at least a year (chronic depression or persistent depressive disorder).  Extreme sadness and hopelessness (melancholic depression).  Trouble speaking and moving (catatonic depression).  How is this diagnosed? This condition may be diagnosed based on:  Your symptoms.  Your medical history, including your mental health history. This may involve tests to evaluate your mental health. You may be asked questions about your lifestyle, including any drug and alcohol use, and how long you have had symptoms of MDD.  A physical exam.  Blood tests to rule out other conditions.  You must have a depressed mood and at least four other MDD symptoms most of the day, nearly every day in the same 2-week timeframe before your health care provider can confirm a diagnosis of MDD. How is this treated? This condition is usually treated by mental health professionals, such as psychologists, psychiatrists, and clinical social workers. You may need more than one type of treatment. Treatment may include:  Psychotherapy. This is also called talk therapy or counseling. Types of psychotherapy include: ? Cognitive behavioral   therapy (CBT). This type of therapy teaches you to recognize unhealthy feelings, thoughts, and behaviors, and replace them with  positive thoughts and actions. ? Interpersonal therapy (IPT). This helps you to improve the way you relate to and communicate with others. ? Family therapy. This treatment includes members of your family.  Medicine to treat anxiety and depression, or to help you control certain emotions and behaviors.  Lifestyle changes, such as: ? Limiting alcohol and drug use. ? Exercising regularly. ? Getting plenty of sleep. ? Making healthy eating choices. ? Spending more time outdoors.  Treatments involving stimulation of the brain can be used in situations with extremely severe symptoms, or when medicine or other therapies do not work over time. These treatments include electroconvulsive therapy, transcranial magnetic stimulation, and vagal nerve stimulation. Follow these instructions at home: Activity  Return to your normal activities as told by your health care provider.  Exercise regularly and spend time outdoors as told by your health care provider. General instructions  Take over-the-counter and prescription medicines only as told by your health care provider.  Do not drink alcohol. If you drink alcohol, limit your alcohol intake to no more than 1 drink a day for nonpregnant women and 2 drinks a day for men. One drink equals 12 oz of beer, 5 oz of wine, or 1 oz of hard liquor. Alcohol can affect any antidepressant medicines you are taking. Talk to your health care provider about your alcohol use.  Eat a healthy diet and get plenty of sleep.  Find activities that you enjoy doing, and make time to do them.  Consider joining a support group. Your health care provider may be able to recommend a support group.  Keep all follow-up visits as told by your health care provider. This is important. Where to find more information: National Alliance on Mental Illness  www.nami.org  U.S. National Institute of Mental Health  www.nimh.nih.gov  National Suicide Prevention  Lifeline  1-800-273-TALK (8255). This is free, 24-hour help.  Contact a health care provider if:  Your symptoms get worse.  You develop new symptoms. Get help right away if:  You self-harm.  You have serious thoughts about hurting yourself or others.  You see, hear, taste, smell, or feel things that are not present (hallucinate). This information is not intended to replace advice given to you by your health care provider. Make sure you discuss any questions you have with your health care provider. Document Released: 08/20/2012 Document Revised: 12/31/2015 Document Reviewed: 11/04/2015 Elsevier Interactive Patient Education  2017 Elsevier Inc.  

## 2016-07-16 DIAGNOSIS — I10 Essential (primary) hypertension: Secondary | ICD-10-CM | POA: Insufficient documentation

## 2016-07-31 ENCOUNTER — Emergency Department (HOSPITAL_COMMUNITY)
Admission: EM | Admit: 2016-07-31 | Discharge: 2016-07-31 | Disposition: A | Discharge status: Home / Self Care | Payer: Medicare Other | Attending: Emergency Medicine | Admitting: Emergency Medicine

## 2016-07-31 ENCOUNTER — Encounter (HOSPITAL_COMMUNITY): Payer: Self-pay | Admitting: Emergency Medicine

## 2016-07-31 DIAGNOSIS — S91011A Laceration without foreign body, right ankle, initial encounter: Secondary | ICD-10-CM | POA: Insufficient documentation

## 2016-07-31 DIAGNOSIS — I10 Essential (primary) hypertension: Secondary | ICD-10-CM | POA: Insufficient documentation

## 2016-07-31 DIAGNOSIS — Z8546 Personal history of malignant neoplasm of prostate: Secondary | ICD-10-CM | POA: Insufficient documentation

## 2016-07-31 DIAGNOSIS — Z87891 Personal history of nicotine dependence: Secondary | ICD-10-CM | POA: Insufficient documentation

## 2016-07-31 DIAGNOSIS — Y939 Activity, unspecified: Secondary | ICD-10-CM | POA: Diagnosis not present

## 2016-07-31 DIAGNOSIS — W2203XA Walked into furniture, initial encounter: Secondary | ICD-10-CM | POA: Diagnosis not present

## 2016-07-31 DIAGNOSIS — Z79899 Other long term (current) drug therapy: Secondary | ICD-10-CM | POA: Diagnosis not present

## 2016-07-31 DIAGNOSIS — Y999 Unspecified external cause status: Secondary | ICD-10-CM | POA: Insufficient documentation

## 2016-07-31 DIAGNOSIS — Y929 Unspecified place or not applicable: Secondary | ICD-10-CM | POA: Insufficient documentation

## 2016-07-31 DIAGNOSIS — S81811A Laceration without foreign body, right lower leg, initial encounter: Secondary | ICD-10-CM

## 2016-07-31 MED ORDER — LIDOCAINE-EPINEPHRINE (PF) 2 %-1:200000 IJ SOLN
INTRAMUSCULAR | Status: AC
  Administered 2016-07-31: 21:00:00
  Filled 2016-07-31: qty 20

## 2016-07-31 MED ORDER — BACITRACIN ZINC 500 UNIT/GM EX OINT
TOPICAL_OINTMENT | CUTANEOUS | Status: AC
  Administered 2016-07-31: 1
  Filled 2016-07-31: qty 0.9

## 2016-07-31 NOTE — ED Provider Notes (Signed)
[facility] DEPT Provider Note   CSN: [id number] Arrival date & time: 07/31/16  1905  By signing my name below, I, Hansel Feinstein, attest that this documentation has been prepared under the direction and in the presence of [employer], PA-C. Electronically Signed: Hansel Feinstein, ED Scribe. 07/31/16. 8:18 PM.    History   Chief Complaint Chief Complaint  Patient presents with  . skin tear    HPI Christian Ward is a 66 y.o. male with h/o varicose veins who presents to the Emergency Department complaining of a skin tear with controlled bleeding to the right ankle. Per pt, he bumped the ankle on a piece of furniture at some point and his bleeding began. He denies fall, LOC or head injury. Pt states it bled profusely initially, but was eventually controlled with pressure and dressing application. He is ambulatory without difficulty. He does not take any anticoagulants. No h/o DM. He denies pain to the area, additional injuries.     The history is provided by the patient. No language interpreter was used.    Past Medical History:  Diagnosis Date  . Acid reflux   . Cancer ([place])   . 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 Complication of anesthesia    trouble waking up  . H/O infectious mononucleosis   . History of shingles   . Hypercholesterolemia   . IBS (irritable bowel syndrome)   . 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 S/P radiation therapy   . Shoulder pain, left     Patient Active Problem List   Diagnosis Date Noted  . Essential hypertension 07/16/2016  . Depression with anxiety 07/14/2016  . Constipation 08/06/2015  . LBP (low back pain) 03/15/2014  . Physical exam, annual 03/14/2014  . GERD (gastroesophageal reflux disease) 09/29/2011  . HYPERCHOLESTEROLEMIA, MILD 04/06/2009  . ADENOCARCINOMA, PROSTATE 04/09/2007  . Irritable bowel syndrome 04/09/2007    Past Surgical History:  Procedure Laterality Date  . APPENDECTOMY  age 16  .  CHOLECYSTECTOMY  s/p lap chole 4/07   Dr Dalbert Batman  . PROSTATE BIOPSY  2008, 10/2008  . PROSTATECTOMY  08/06/2009   Gleason 4+3=7       Home Medications    Prior to Admission medications   Medication Sig Start Date End Date Taking? Authorizing Provider  Ascorbic Acid (VITAMIN C) 1000 MG tablet Take 1,000 mg by mouth daily. Reported on 08/06/2015    Historical Provider, MD  fluticasone (FLONASE) 50 MCG/ACT nasal spray Place 1 spray into both nostrils daily.    Historical Provider, MD  omeprazole (PRILOSEC) 20 MG capsule Take 20 mg by mouth 2 (two) times daily.     Historical Provider, MD  Probiotic Product (PROBIOTIC DAILY) CAPS Take 1 capsule by mouth daily.    Historical Provider, MD  vortioxetine HBr (TRINTELLIX) 5 MG TABS Take 1 tablet (5 mg total) by mouth daily. 07/14/16   Janith Lima, MD    Family History Family History  Problem Relation Age of Onset  . Hypertension Father   . Cancer Father     prostate  . Stroke Father   . COPD Father   . Coronary artery disease Father   . Lung disease Father   . Heart disease Father   . Diabetes Father   . Lung disease Mother   . Diabetes Sister   . Cancer Brother     surgery    Social History Social History  Substance Use Topics  . Smoking status: Former  Smoker    Packs/day: 0.50    Years: 10.00    Types: Cigarettes    Start date: 05/09/1980    Quit date: 03/16/1986  . Smokeless tobacco: Never Used  . Alcohol use 0.0 oz/week     Comment: weekend     Allergies   Oxycodone   Review of Systems Review of Systems A complete 10 system review of systems was obtained and all systems are negative except as noted in the HPI and PMH.    Physical Exam Updated Vital Signs BP (!) 141/99   Pulse 79   Temp 98.2 F (36.8 C) (Oral)   Resp 18   SpO2 97%   Physical Exam  Constitutional: He appears well-developed and well-nourished.  HENT:  Head: Normocephalic.  Eyes: Conjunctivae are normal.  Cardiovascular: Normal rate.     Pulmonary/Chest: Effort normal. No respiratory distress.  Abdominal: He exhibits no distension.  Musculoskeletal: Normal range of motion.  Right medial ankle with very minor skin tear with bleeding  Neurological: He is alert.  Skin: Skin is warm and dry.  Psychiatric: He has a normal mood and affect. His behavior is normal.  Nursing note and vitals reviewed.    ED Treatments / Results   DIAGNOSTIC STUDIES: Oxygen Saturation is 97% on RA, normal by my interpretation.    COORDINATION OF CARE: 8:11 PM Discussed treatment plan with pt at bedside which includes wound care and pt agreed to plan.    Labs (all labs ordered are listed, but only abnormal results are displayed) Labs Reviewed - No data to display  EKG  EKG Interpretation None       Radiology No results found.  Procedures .Marland KitchenLaceration Repair Date/Time: 07/31/2016 8:36 PM Performed by: Penni Bombard, Brycelynn Stampley Authorized by: Penni Bombard, Lourene Hoston   Consent:    Consent obtained:  Verbal   Consent given by:  Patient   Risks discussed:  Infection, pain, poor cosmetic result and poor wound healing   Alternatives discussed:  No treatment Universal protocol:    Procedure explained and questions answered to patient or proxy's satisfaction: yes     Immediately prior to procedure, a time out was called: yes     Patient identity confirmed:  Verbally with patient Anesthesia (see MAR for exact dosages):    Anesthesia method:  Topical application and local infiltration   Local anesthetic:  Lidocaine 1% WITH epi Laceration details:    Location:  Foot   Foot location:  R ankle Repair type:    Repair type:  Simple Pre-procedure details:    Preparation:  Patient was prepped and draped in usual sterile fashion Exploration:    Hemostasis achieved with:  Direct pressure and epinephrine   Contaminated: no   Treatment:    Area cleansed with:  Betadine   Amount of cleaning:  Standard Skin repair:    Repair method:  Sutures   Suture  size:  4-0   Wound skin closure material used: vicryl rapide.   Suture technique:  Figure eight   Number of sutures:  1 Approximation:    Approximation:  Close Post-procedure details:    Dressing:  Sterile dressing and antibiotic ointment   Patient tolerance of procedure:  Tolerated well, no immediate complications      (including critical care time)  Medications Ordered in ED Medications  lidocaine-EPINEPHrine (XYLOCAINE W/EPI) 2 %-1:200000 (PF) injection (  Given by Other 07/31/16 2045)  bacitracin 500 UNIT/GM ointment (1 application  Given 8/46/65 2045)     Initial Impression /  Assessment and Plan / ED Course  I have reviewed the triage vital signs and the nursing notes.  Pertinent labs & imaging results that were available during my care of the patient were reviewed by me and considered in my medical decision making (see chart for details).     Labs: none indicated  Imaging: none indicated  Consults: none  Therapeutics: Laceration repair performed in the ED   Discharge Meds:   Assessment/Plan: Patient with very minor laceration causing bleeding.  Figure-of-eight with dissolvable suture was placed which provided hemostasis.  Patient is given strict return precautions, he verbalized understanding and agreement to today's plan had no further questions or concerns.    Final Clinical Impressions(s) / ED Diagnoses   Final diagnoses:  Skin tear of right lower leg without complication, initial encounter    New Prescriptions Discharge Medication List as of 07/31/2016  8:46 PM      I personally performed the services described in this documentation, which was scribed in my presence. The recorded information has been reviewed and is accurate.    Okey Regal, PA-C 56/25/63 [id number]    Delora Fuel, MD 34/28/76 [id number]    Delora Fuel, MD 72/62/03 [id number]

## 2016-07-31 NOTE — ED Triage Notes (Addendum)
Patient presents complaining of skin tear to right ankle. Bleeding controlled. Ambulatory to triage. Varicose veins noted at area.

## 2016-07-31 NOTE — Discharge Instructions (Signed)
Please read attached information. If you experience any new or worsening signs or symptoms please return to the emergency room for evaluation. Please follow-up with your primary care provider or specialist as discussed.  °

## 2016-08-01 ENCOUNTER — Encounter: Payer: Self-pay | Admitting: Internal Medicine

## 2016-08-01 ENCOUNTER — Ambulatory Visit (INDEPENDENT_AMBULATORY_CARE_PROVIDER_SITE_OTHER): Payer: Medicare Other | Admitting: Internal Medicine

## 2016-08-01 ENCOUNTER — Telehealth: Payer: Self-pay | Admitting: Internal Medicine

## 2016-08-01 VITALS — BP 140/80 | HR 74 | Temp 97.6°F | Ht 76.0 in | Wt 216.8 lb

## 2016-08-01 DIAGNOSIS — T148XXA Other injury of unspecified body region, initial encounter: Secondary | ICD-10-CM

## 2016-08-01 NOTE — Telephone Encounter (Signed)
Routing to dr jones, please advise, thanks 

## 2016-08-01 NOTE — Patient Instructions (Signed)
Leave on bandage  Pressure dressing for a few days   Before taking off and soak   Off.

## 2016-08-01 NOTE — Telephone Encounter (Signed)
Patient Name: Christian Ward DOB: May 30, 1950 Initial Comment caller yesterday hit his ankle on the edge of the staircase after 20 min he started to bleed he had a puncture and he eventually went to the ER and got stitches with ointment, he was instructed to remove bandage today and took a shower it was still bleeding. He wants to know if he needs to go in to see [name] Nurse Assessment Nurse: Vallery Sa, RN, Tye [place] Date/Time Eilene Ghazi Time): 08/01/2016 1:15:14 PM Confirm and document reason for call. If symptomatic, describe symptoms. ---Caller states that he had two stitches placed in his ankle yesterday. He was advised to take the dressing off today to shower and he developed bleeding from the stitches again. No active bleeding at this time. No fever. Alert and responsive. Does the patient have any new or worsening symptoms? ---Yes Will a triage be completed? ---Yes Related visit to physician within the last 2 weeks? ---Yes Does the PT have any chronic conditions? (i.e. diabetes, asthma, etc.) ---Yes List chronic conditions. ---Back problems, Acid Reflux Is this a behavioral health or substance abuse call? ---No Guidelines Guideline Title Affirmed Question Affirmed Notes Suture or Staple Questions [1] Suture came out early AND [2] > 48 hours since sutures placed (all triage questions negative) Final Disposition User Macy, RN, Tye [place] Comments Scheduled for 3:30pm appointment today with [name]. Referrals REFERRED TO PCP OFFICE Disagree/Comply: Christian Ward unwilling to check the incision area further, but has concerns that a stitch may have come out.

## 2016-08-01 NOTE — Progress Notes (Signed)
Chief Complaint  Patient presents with  . Acute Visit    bleeding on the right ankle    HPI: IZYK MARTY 66 y.o.  sda aptp  Seen in ed yesterday   fo abrastion lceration ankle and bleeding x with figure   e98   suturesing  Called team health and told to come in but pcp NA so sent off site .  He states that the inner part of his ankles hit an object even though he had a sock on and had a very small laceration but with a pumping blood vessel that he was told as a varicose vein. Initial plan was going to an pressure but physician advised a stitch so figure 8 suture to decrease bleeding however this morning after the shower he noticed bloody oozing outside of the bandage. He has to go to [facility] and drive his feet down wants to be able to walk and do things normally asked if it should be re-stitched because he saw a. No increased pain blood thinners. He is on Celebrex and high-dose vitamin C. He reports the area of the important as a tiny opening in the skin. ROS: See pertinent positives and negatives per HPI. No other bruising and bleeding or injury.  Past Medical History:  Diagnosis Date  . Acid reflux   . Cancer ([place])   . 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 Complication of anesthesia    trouble waking up  . H/O infectious mononucleosis   . History of shingles   . Hypercholesterolemia   . IBS (irritable bowel syndrome)   . 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 S/P radiation therapy   . Shoulder pain, left     Family History  Problem Relation Age of Onset  . Hypertension Father   . Cancer Father     prostate  . Stroke Father   . COPD Father   . Coronary artery disease Father   . Lung disease Father   . Heart disease Father   . Diabetes Father   . Lung disease Mother   . Diabetes Sister   . Cancer Brother     surgery    Social History   Social History  . Marital status: Married    Spouse name: N/A  . Number of children: N/A  . Years of  education: N/A   Social History Main Topics  . Smoking status: Former Smoker    Packs/day: 0.50    Years: 10.00    Types: Cigarettes    Start date: 05/09/1980    Quit date: 03/16/1986  . Smokeless tobacco: Never Used  . Alcohol use 0.0 oz/week     Comment: weekend  . Drug use: No  . Sexual activity: Not Currently   Other Topics Concern  . None   Social History Narrative  . None    Outpatient Medications Prior to Visit  Medication Sig Dispense Refill  . Ascorbic Acid (VITAMIN C) 1000 MG tablet Take 1,000 mg by mouth daily. Reported on 08/06/2015    . fluticasone (FLONASE) 50 MCG/ACT nasal spray Place 1 spray into both nostrils daily.    [name] omeprazole (PRILOSEC) 20 MG capsule Take 20 mg by mouth 2 (two) times daily.     . Probiotic Product (PROBIOTIC DAILY) CAPS Take 1 capsule by mouth daily.    [name] vortioxetine HBr (TRINTELLIX) 5 MG TABS Take 1 tablet (5 mg total) by mouth daily. 42 tablet 0   No facility-administered  medications prior to visit.      EXAM:  BP 140/80 (BP Location: Left Arm, Patient Position: Sitting, Cuff Size: Normal)   Pulse 74   Temp 97.6 F (36.4 C) (Oral)   Ht 6\' 4"  (1.93 m)   Wt 216 lb 12.8 oz (98.3 kg)   BMI 26.39 kg/m   Body mass index is 26.39 kg/m.  GENERAL: vitals reviewed and listed above, alert, oriented, appears well hydrated and in no acute distress MS: moves all extremities without noticeable focal  Abnormality Right lower extremity beneath the medial malleolus has a fabric bandage that is obviously had some soaking of blood but no active bleeding around the bandage. Discussion of whether to take the bandage off and check or not occurred. PSYCH: pleasant and cooperative, no obvious depression or anxiety  ASSESSMENT AND PLAN:  Discussed the following assessment and plan:  Bleeding from wound - see text seems superficial and best to  use pressure bandage as ther is no acitve bleeding past the bandage now but was earlier  options  discuseed  History of small laceration affecting a blood vessel with purportedly not full hemostasis at this time. Based on the history would be probably safer to leave on the bandage put pressure on the area use nonstick dressing and [place] and 3 range the area elevate foot when he can with cold treatment as possible. We felt that taking off the bandage was higher risk of rebleeding  Today  And would be no guarantee that he would have better outcome as far as losing blood. His ankle looks fine I think this is not a dangerous problem but certainly is affecting his schedule. No petechia or other evidence of bleeding tendency  -Patient advised to return or notify health care team  if symptoms worsen ,persist or new concerns arise. Total visit 74mins > 50% spent counseling and coordinating care as indicated in above note and in instructions to patient .  Patient Instructions  Leave on bandage  Pressure dressing for a few days   Before taking off and soak   Off.     Standley Brooking. Chablis Losh M.D.

## 2016-08-01 NOTE — Telephone Encounter (Signed)
Patient scheduled appt with dr Zack Seal

## 2016-08-01 NOTE — Telephone Encounter (Signed)
I think he should go back to the location where the sutures were placed

## 2016-08-19 DIAGNOSIS — H6123 Impacted cerumen, bilateral: Secondary | ICD-10-CM | POA: Diagnosis not present

## 2016-08-19 DIAGNOSIS — R42 Dizziness and giddiness: Secondary | ICD-10-CM | POA: Diagnosis not present

## 2016-08-22 DIAGNOSIS — C61 Malignant neoplasm of prostate: Secondary | ICD-10-CM | POA: Diagnosis not present

## 2016-08-24 ENCOUNTER — Other Ambulatory Visit: Payer: Self-pay | Admitting: Orthopedic Surgery

## 2016-08-30 NOTE — Pre-Procedure Instructions (Signed)
COLEY KULIKOWSKI  08/30/2016      [id number] [address] [place] [address] Phone: [phone] Fax: [phone]    Your procedure is scheduled on Thurs, May 3 @ 2:15 PM  Report to [facility] Admitting at 11:15 AM  Call this number if you have problems the morning of surgery:  [phone]   Remember:  Do not eat food or drink liquids after midnight.  Take these medicines the morning of surgery with A SIP OF WATER Flonase(Fluticasone), Omeprazole(Prilosec),and Zofran(Ondansetron-if needed)             Stop taking your Vitamins along with any Herbal Medications a week prior to surgery. Also No Goody's,BC's,Aleve,Advil,Motrin,Ibuprofen,Aspirin,or Fish Oil.    Do not wear jewelry.  Do not wear lotions, powders,colognes, or deoderant.  Men may shave face and neck.  Do not bring valuables to the hospital.  [facility] is not responsible for any belongings or valuables.  Contacts, dentures or bridgework may not be worn into surgery.  Leave your suitcase in the car.  After surgery it may be brought to your room.  For patients admitted to the hospital, discharge time will be determined by your treatment team.  Patients discharged the day of surgery will not be allowed to drive home.    [facility]  Before surgery, you can play an important role.  Because skin is not sterile, your skin needs to be as free of germs as possible.  You can reduce the number of germs on you skin by washing with CHG (chlorahexidine gluconate) soap before surgery.  CHG is an antiseptic cleaner which kills germs and bonds with the skin to continue killing germs even after washing.  Please DO NOT use if you have an allergy to CHG or antibacterial soaps.  If your skin becomes reddened/irritated stop using the CHG and inform your nurse when you arrive at Short Stay.  Do not shave (including legs and  underarms) for at least 48 hours prior to the first CHG shower.  You may shave your face.  Please follow these instructions carefully:   1.  Shower with CHG Soap the night before surgery and the                                morning of Surgery.  2.  If you choose to wash your hair, wash your hair first as usual with your       normal shampoo.  3.  After you shampoo, rinse your hair and body thoroughly to remove the                      Shampoo.  4.  Use CHG as you would any other liquid soap.  You can apply chg directly       to the skin and wash gently with scrungie or a clean washcloth.  5.  Apply the CHG Soap to your body ONLY FROM THE NECK DOWN.        Do not use on open wounds or open sores.  Avoid contact with your eyes,       ears, mouth and genitals (private parts).  Wash genitals (private parts)       with your normal soap.  6.  Wash thoroughly, paying special attention to the area where your surgery  will be performed.  7.  Thoroughly rinse your body with warm water from the neck down.  8.  DO NOT shower/wash with your normal soap after using and rinsing off       the CHG Soap.  9.  Pat yourself dry with a clean towel.            10.  Wear clean pajamas.            11.  Place clean sheets on your bed the night of your first shower and do not        sleep with pets.  Day of Surgery  Do not apply any lotions/deoderants the morning of surgery.  Please wear clean clothes to the hospital/surgery center.    Please read over the following fact sheets that you were given. Pain Booklet, Coughing and Deep Breathing, MRSA Information and Surgical Site Infection Prevention

## 2016-08-31 ENCOUNTER — Encounter (HOSPITAL_COMMUNITY)
Admission: RE | Admit: 2016-08-31 | Discharge: 2016-08-31 | Disposition: A | Discharge status: Home / Self Care | Payer: Medicare Other | Source: Ambulatory Visit | Attending: Orthopedic Surgery | Admitting: Orthopedic Surgery

## 2016-08-31 ENCOUNTER — Encounter (HOSPITAL_COMMUNITY): Payer: Self-pay

## 2016-08-31 DIAGNOSIS — Z01818 Encounter for other preprocedural examination: Secondary | ICD-10-CM

## 2016-08-31 DIAGNOSIS — Z01812 Encounter for preprocedural laboratory examination: Secondary | ICD-10-CM | POA: Diagnosis not present

## 2016-08-31 DIAGNOSIS — I700 Atherosclerosis of aorta: Secondary | ICD-10-CM | POA: Insufficient documentation

## 2016-08-31 DIAGNOSIS — Z01810 Encounter for preprocedural cardiovascular examination: Secondary | ICD-10-CM | POA: Insufficient documentation

## 2016-08-31 HISTORY — DX: Fibromyalgia: M79.7

## 2016-08-31 LAB — CBC WITH DIFFERENTIAL/PLATELET
BASOS ABS: 0 10*3/uL (ref 0.0–0.1)
Basophils Relative: 0 %
Eosinophils Absolute: 0.1 10*3/uL (ref 0.0–0.7)
Eosinophils Relative: 2 %
HEMATOCRIT: 38.5 % — AB (ref 39.0–52.0)
HEMOGLOBIN: 12.7 g/dL — AB (ref 13.0–17.0)
Lymphocytes Relative: 26 %
Lymphs Abs: 1.1 10*3/uL (ref 0.7–4.0)
MCH: 30.5 pg (ref 26.0–34.0)
MCHC: 33 g/dL (ref 30.0–36.0)
MCV: 92.5 fL (ref 78.0–100.0)
MONO ABS: 0.4 10*3/uL (ref 0.1–1.0)
Monocytes Relative: 8 %
NEUTROS ABS: 2.8 10*3/uL (ref 1.7–7.7)
NEUTROS PCT: 64 %
Platelets: 174 10*3/uL (ref 150–400)
RBC: 4.16 MIL/uL — AB (ref 4.22–5.81)
RDW: 13.1 % (ref 11.5–15.5)
WBC: 4.4 10*3/uL (ref 4.0–10.5)

## 2016-08-31 LAB — COMPREHENSIVE METABOLIC PANEL
ALBUMIN: 3.8 g/dL (ref 3.5–5.0)
ALT: 22 U/L (ref 17–63)
AST: 26 U/L (ref 15–41)
Alkaline Phosphatase: 64 U/L (ref 38–126)
Anion gap: 7 (ref 5–15)
BILIRUBIN TOTAL: 0.9 mg/dL (ref 0.3–1.2)
BUN: 15 mg/dL (ref 6–20)
CO2: 27 mmol/L (ref 22–32)
CREATININE: 1.02 mg/dL (ref 0.61–1.24)
Calcium: 9.3 mg/dL (ref 8.9–10.3)
Chloride: 107 mmol/L (ref 101–111)
GFR calc Af Amer: >60 mL/min (ref >60)
GLUCOSE: 111 mg/dL — AB (ref 65–99)
Potassium: 3.9 mmol/L (ref 3.5–5.1)
Sodium: 141 mmol/L (ref 135–145)
TOTAL PROTEIN: 6.3 g/dL — AB (ref 6.5–8.1)

## 2016-08-31 LAB — URINALYSIS, ROUTINE W REFLEX MICROSCOPIC
Bilirubin Urine: NEGATIVE
GLUCOSE, UA: NEGATIVE mg/dL
Hgb urine dipstick: NEGATIVE
KETONES UR: NEGATIVE mg/dL
LEUKOCYTES UA: NEGATIVE
Nitrite: NEGATIVE
Protein, ur: NEGATIVE mg/dL
SPECIFIC GRAVITY, URINE: 1.026 (ref 1.005–1.030)
pH: 5 (ref 5.0–8.0)

## 2016-08-31 LAB — SURGICAL PCR SCREEN
MRSA, PCR: NEGATIVE
STAPHYLOCOCCUS AUREUS: NEGATIVE

## 2016-08-31 LAB — APTT: APTT: 33 s (ref 24–36)

## 2016-08-31 LAB — PROTIME-INR
INR: 1.06
PROTHROMBIN TIME: 13.9 s (ref 11.4–15.2)

## 2016-08-31 NOTE — Progress Notes (Signed)
PCp is Dr. Scarlette Calico Denies ever seeing a cardiologist. Denies ever having a card cath, stress test, or echo. Denies any chest pain, cough, or fever.  Requested Ekg tracing from [place]. States he was hard to wake up after his prostate surgery, but has been fine with other procedures.

## 2016-09-07 NOTE — H&P (Signed)
PREOPERATIVE H&P  Chief Complaint: Right low back pain  HPI: Christian Ward is a 66 y.o. male who presents with ongoing pain in the right side of his low back  History and exam very c/w right SI dysfunction  Patient has failed multiple forms of conservative care and continues to have pain (see office notes for additional details regarding the patient's full course of treatment)  Past Medical History:  Diagnosis Date  . Acid reflux   . Cancer ([place])   . 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 Complication of anesthesia    trouble waking up  . Fibromyalgia   . H/O infectious mononucleosis   . History of shingles   . Hypercholesterolemia   . IBS (irritable bowel syndrome)   . 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 S/P radiation therapy   . Shoulder pain, left    Past Surgical History:  Procedure Laterality Date  . APPENDECTOMY  age 30  . CHOLECYSTECTOMY  s/p lap chole 4/07   Dr Dalbert Batman  . COLONOSCOPY    . PROSTATE BIOPSY  2008, 10/2008  . PROSTATECTOMY  08/06/2009   Gleason 4+3=7   Social History   Social History  . Marital status: Married    Spouse name: N/A  . Number of children: N/A  . Years of education: N/A   Social History Main Topics  . Smoking status: Former Smoker    Packs/day: 0.50    Years: 10.00    Types: Cigarettes    Start date: 05/09/1980    Quit date: 03/16/1986  . Smokeless tobacco: Never Used  . Alcohol use 0.0 oz/week     Comment: weekend  . Drug use: No  . Sexual activity: Not Currently   Other Topics Concern  . Not on file   Social History Narrative  . No narrative on file   Family History  Problem Relation Age of Onset  . Hypertension Father   . Cancer Father     prostate  . Stroke Father   . COPD Father   . Coronary artery disease Father   . Lung disease Father   . Heart disease Father   . Diabetes Father   . Lung disease Mother   . Diabetes Sister   . Cancer Brother     surgery   Allergies    Allergen Reactions  . Oxycodone Nausea Only   Prior to Admission medications   Medication Sig Start Date End Date Taking? Authorizing Provider  acetaminophen (TYLENOL) 500 MG tablet Take 500 mg by mouth every 6 (six) hours as needed for mild pain.   Yes Historical Provider, MD  celecoxib (CELEBREX) 200 MG capsule Take 200 mg by mouth 2 (two) times daily.   Yes Historical Provider, MD  Cholecalciferol (VITAMIN D PO) Take 1 tablet by mouth daily.   Yes Historical Provider, MD  fluticasone (FLONASE) 50 MCG/ACT nasal spray Place 1 spray into both nostrils daily as needed for allergies.    Yes Historical Provider, MD  Magnesium 250 MG TABS Take 250 mg by mouth daily.   Yes Historical Provider, MD  Multiple Vitamin (MULTI VITAMIN DAILY PO) Take 1 tablet by mouth daily.    Yes Historical Provider, MD  omeprazole (PRILOSEC) 20 MG capsule Take 20 mg by mouth 2 (two) times daily as needed (acid reflux). Takes 1 daily and may take a second dose if needed   Yes Historical Provider, MD  Probiotic Product (PROBIOTIC DAILY) CAPS Take  1 capsule by mouth daily.   Yes Historical Provider, MD  ondansetron (ZOFRAN) 4 MG tablet Take 2-4 mg by mouth at bedtime as needed for nausea.  08/14/16   Historical Provider, MD     All other systems have been reviewed and were otherwise negative with the exception of those mentioned in the HPI and as above.  Physical Exam: There were no vitals filed for this visit.  General: Alert, no acute distress Cardiovascular: No pedal edema Respiratory: No cyanosis, no use of accessory musculature Skin: No lesions in the area of chief complaint Neurologic: Sensation intact distally Psychiatric: Patient is competent for consent with normal mood and affect Lymphatic: No axillary or cervical lymphadenopathy  MUSCULOSKELETAL: + TTP over right low back  Assessment/Plan: Right sacroiliac joint dysfunction Plan for Procedure(s): RIGHT SIDED SACROILIAC JOINT  FUSION   Sinclair Ship, MD 09/07/2016 11:02 AM

## 2016-09-08 ENCOUNTER — Ambulatory Visit (HOSPITAL_COMMUNITY): Payer: Medicare Other

## 2016-09-08 ENCOUNTER — Encounter (HOSPITAL_COMMUNITY)
Admission: RE | Disposition: A | Discharge status: Home / Self Care | Payer: Self-pay | Source: Ambulatory Visit | Attending: Orthopedic Surgery

## 2016-09-08 ENCOUNTER — Ambulatory Visit (HOSPITAL_COMMUNITY)
Admission: RE | Admit: 2016-09-08 | Discharge: 2016-09-08 | Disposition: A | Discharge status: Home / Self Care | Payer: Medicare Other | Source: Ambulatory Visit | Attending: Orthopedic Surgery | Admitting: Orthopedic Surgery

## 2016-09-08 ENCOUNTER — Ambulatory Visit (HOSPITAL_COMMUNITY): Payer: Medicare Other | Admitting: Anesthesiology

## 2016-09-08 ENCOUNTER — Encounter (HOSPITAL_COMMUNITY): Payer: Self-pay | Admitting: *Deleted

## 2016-09-08 ENCOUNTER — Ambulatory Visit (HOSPITAL_COMMUNITY): Payer: Medicare Other | Admitting: Emergency Medicine

## 2016-09-08 DIAGNOSIS — Z833 Family history of diabetes mellitus: Secondary | ICD-10-CM | POA: Diagnosis not present

## 2016-09-08 DIAGNOSIS — Z885 Allergy status to narcotic agent status: Secondary | ICD-10-CM | POA: Diagnosis not present

## 2016-09-08 DIAGNOSIS — M533 Sacrococcygeal disorders, not elsewhere classified: Secondary | ICD-10-CM | POA: Insufficient documentation

## 2016-09-08 DIAGNOSIS — Z9049 Acquired absence of other specified parts of digestive tract: Secondary | ICD-10-CM | POA: Insufficient documentation

## 2016-09-08 DIAGNOSIS — Z8042 Family history of malignant neoplasm of prostate: Secondary | ICD-10-CM | POA: Insufficient documentation

## 2016-09-08 DIAGNOSIS — Z836 Family history of other diseases of the respiratory system: Secondary | ICD-10-CM | POA: Insufficient documentation

## 2016-09-08 DIAGNOSIS — Z8546 Personal history of malignant neoplasm of prostate: Secondary | ICD-10-CM | POA: Diagnosis not present

## 2016-09-08 DIAGNOSIS — Z8619 Personal history of other infectious and parasitic diseases: Secondary | ICD-10-CM | POA: Diagnosis not present

## 2016-09-08 DIAGNOSIS — E7800 Pure hypercholesterolemia, unspecified: Secondary | ICD-10-CM | POA: Insufficient documentation

## 2016-09-08 DIAGNOSIS — Z87891 Personal history of nicotine dependence: Secondary | ICD-10-CM | POA: Insufficient documentation

## 2016-09-08 DIAGNOSIS — Z923 Personal history of irradiation: Secondary | ICD-10-CM | POA: Diagnosis not present

## 2016-09-08 DIAGNOSIS — M797 Fibromyalgia: Secondary | ICD-10-CM | POA: Insufficient documentation

## 2016-09-08 DIAGNOSIS — K219 Gastro-esophageal reflux disease without esophagitis: Secondary | ICD-10-CM | POA: Insufficient documentation

## 2016-09-08 DIAGNOSIS — I10 Essential (primary) hypertension: Secondary | ICD-10-CM | POA: Diagnosis not present

## 2016-09-08 DIAGNOSIS — K589 Irritable bowel syndrome without diarrhea: Secondary | ICD-10-CM | POA: Diagnosis not present

## 2016-09-08 DIAGNOSIS — J449 Chronic obstructive pulmonary disease, unspecified: Secondary | ICD-10-CM | POA: Insufficient documentation

## 2016-09-08 DIAGNOSIS — Z8249 Family history of ischemic heart disease and other diseases of the circulatory system: Secondary | ICD-10-CM | POA: Diagnosis not present

## 2016-09-08 DIAGNOSIS — M545 Low back pain: Secondary | ICD-10-CM | POA: Insufficient documentation

## 2016-09-08 DIAGNOSIS — Z419 Encounter for procedure for purposes other than remedying health state, unspecified: Secondary | ICD-10-CM

## 2016-09-08 HISTORY — PX: SACROILIAC JOINT FUSION: SHX6088

## 2016-09-08 SURGERY — SACROILIAC JOINT FUSION
Anesthesia: General | Site: Spine Lumbar | Laterality: Right

## 2016-09-08 MED ORDER — POVIDONE-IODINE 7.5 % EX SOLN: Freq: Once | CUTANEOUS | Status: DC

## 2016-09-08 MED ORDER — FENTANYL CITRATE (PF) 250 MCG/5ML IJ SOLN
INTRAMUSCULAR | Status: AC
  Filled 2016-09-08: qty 5

## 2016-09-08 MED ORDER — BUPIVACAINE-EPINEPHRINE (PF) 0.5% -1:200000 IJ SOLN
INTRAMUSCULAR | Status: AC
  Filled 2016-09-08: qty 30

## 2016-09-08 MED ORDER — PROMETHAZINE HCL 25 MG/ML IJ SOLN
INTRAMUSCULAR | Status: AC
  Filled 2016-09-08: qty 1

## 2016-09-08 MED ORDER — ARTIFICIAL TEARS OPHTHALMIC OINT
TOPICAL_OINTMENT | OPHTHALMIC | Status: DC | PRN
  Administered 2016-09-08: 1 via OPHTHALMIC

## 2016-09-08 MED ORDER — LACTATED RINGERS IV SOLN
INTRAVENOUS | Status: DC | PRN
  Administered 2016-09-08: 08:00:00 via INTRAVENOUS

## 2016-09-08 MED ORDER — SUGAMMADEX SODIUM 200 MG/2ML IV SOLN
INTRAVENOUS | Status: DC | PRN
  Administered 2016-09-08: 200 mg via INTRAVENOUS

## 2016-09-08 MED ORDER — PROMETHAZINE HCL 25 MG/ML IJ SOLN
6.2500 mg | INTRAMUSCULAR | Status: DC | PRN
Start: 2016-09-08 — End: 2016-09-08
  Administered 2016-09-08: 6.25 mg via INTRAVENOUS

## 2016-09-08 MED ORDER — DIAZEPAM 5 MG PO TABS
ORAL_TABLET | ORAL | Status: AC
  Filled 2016-09-08: qty 1

## 2016-09-08 MED ORDER — FENTANYL CITRATE (PF) 100 MCG/2ML IJ SOLN
INTRAMUSCULAR | Status: DC | PRN
  Administered 2016-09-08: 25 ug via INTRAVENOUS
  Administered 2016-09-08: 100 ug via INTRAVENOUS
  Administered 2016-09-08 (×3): 25 ug via INTRAVENOUS
  Administered 2016-09-08: 50 ug via INTRAVENOUS

## 2016-09-08 MED ORDER — BUPIVACAINE-EPINEPHRINE 0.5% -1:200000 IJ SOLN
INTRAMUSCULAR | Status: DC | PRN
  Administered 2016-09-08: 20 mL
  Administered 2016-09-08: 7 mL

## 2016-09-08 MED ORDER — LIDOCAINE HCL (CARDIAC) 20 MG/ML IV SOLN
INTRAVENOUS | Status: DC | PRN
  Administered 2016-09-08: 80 mg via INTRAVENOUS

## 2016-09-08 MED ORDER — BUPIVACAINE LIPOSOME 1.3 % IJ SUSP
20.0000 mL | INTRAMUSCULAR | Status: AC
  Administered 2016-09-08: 20 mL
  Filled 2016-09-08: qty 20

## 2016-09-08 MED ORDER — 0.9 % SODIUM CHLORIDE (POUR BTL) OPTIME
TOPICAL | Status: DC | PRN
  Administered 2016-09-08: 1000 mL

## 2016-09-08 MED ORDER — PHENYLEPHRINE HCL 10 MG/ML IJ SOLN
INTRAMUSCULAR | Status: DC | PRN
  Administered 2016-09-08: 40 ug via INTRAVENOUS
  Administered 2016-09-08: 60 ug via INTRAVENOUS
  Administered 2016-09-08: 40 ug via INTRAVENOUS
  Administered 2016-09-08: 20 ug via INTRAVENOUS
  Administered 2016-09-08: 40 ug via INTRAVENOUS

## 2016-09-08 MED ORDER — MIDAZOLAM HCL 2 MG/2ML IJ SOLN
INTRAMUSCULAR | Status: AC
  Filled 2016-09-08: qty 2

## 2016-09-08 MED ORDER — DIAZEPAM 5 MG PO TABS
5.0000 mg | ORAL_TABLET | Freq: Once | ORAL | Status: AC
  Administered 2016-09-08: 5 mg via ORAL

## 2016-09-08 MED ORDER — SUGAMMADEX SODIUM 200 MG/2ML IV SOLN
INTRAVENOUS | Status: AC
  Filled 2016-09-08: qty 2

## 2016-09-08 MED ORDER — EPHEDRINE SULFATE 50 MG/ML IJ SOLN
INTRAMUSCULAR | Status: DC | PRN
  Administered 2016-09-08: 5 mg via INTRAVENOUS

## 2016-09-08 MED ORDER — MIDAZOLAM HCL 5 MG/5ML IJ SOLN
INTRAMUSCULAR | Status: DC | PRN
  Administered 2016-09-08 (×2): 1 mg via INTRAVENOUS

## 2016-09-08 MED ORDER — PROPOFOL 10 MG/ML IV BOLUS
INTRAVENOUS | Status: AC
  Filled 2016-09-08: qty 20

## 2016-09-08 MED ORDER — PROPOFOL 10 MG/ML IV BOLUS
INTRAVENOUS | Status: DC | PRN
  Administered 2016-09-08: 170 mg via INTRAVENOUS

## 2016-09-08 MED ORDER — ARTIFICIAL TEARS OPHTHALMIC OINT
TOPICAL_OINTMENT | OPHTHALMIC | Status: AC
  Filled 2016-09-08: qty 3.5

## 2016-09-08 MED ORDER — HYDROMORPHONE HCL 1 MG/ML IJ SOLN
0.2500 mg | INTRAMUSCULAR | Status: DC | PRN
  Administered 2016-09-08 (×2): 0.5 mg via INTRAVENOUS

## 2016-09-08 MED ORDER — PHENYLEPHRINE 40 MCG/ML (10ML) SYRINGE FOR IV PUSH (FOR BLOOD PRESSURE SUPPORT)
PREFILLED_SYRINGE | INTRAVENOUS | Status: AC
  Filled 2016-09-08: qty 10

## 2016-09-08 MED ORDER — CEFAZOLIN SODIUM-DEXTROSE 2-4 GM/100ML-% IV SOLN
2.0000 g | INTRAVENOUS | Status: AC
  Administered 2016-09-08: 2 g via INTRAVENOUS
  Filled 2016-09-08: qty 100

## 2016-09-08 MED ORDER — OXYCODONE-ACETAMINOPHEN 5-325 MG PO TABS
ORAL_TABLET | ORAL | Status: DC
Start: 2016-09-08 — End: 2016-09-08
  Filled 2016-09-08: qty 2

## 2016-09-08 MED ORDER — LIDOCAINE 2% (20 MG/ML) 5 ML SYRINGE
INTRAMUSCULAR | Status: AC
  Filled 2016-09-08: qty 5

## 2016-09-08 MED ORDER — ROCURONIUM BROMIDE 100 MG/10ML IV SOLN
INTRAVENOUS | Status: DC | PRN
  Administered 2016-09-08: 10 mg via INTRAVENOUS
  Administered 2016-09-08: 50 mg via INTRAVENOUS

## 2016-09-08 MED ORDER — HYDROMORPHONE HCL 1 MG/ML IJ SOLN
INTRAMUSCULAR | Status: AC
  Administered 2016-09-08: 0.5 mg via INTRAVENOUS
  Filled 2016-09-08: qty 1

## 2016-09-08 MED ORDER — ROCURONIUM BROMIDE 10 MG/ML (PF) SYRINGE
PREFILLED_SYRINGE | INTRAVENOUS | Status: AC
  Filled 2016-09-08: qty 5

## 2016-09-08 MED ORDER — ONDANSETRON HCL 4 MG/2ML IJ SOLN
INTRAMUSCULAR | Status: AC
  Filled 2016-09-08: qty 2

## 2016-09-08 MED ORDER — ONDANSETRON HCL 4 MG/2ML IJ SOLN
INTRAMUSCULAR | Status: DC | PRN
  Administered 2016-09-08: 4 mg via INTRAVENOUS

## 2016-09-08 MED ORDER — OXYCODONE-ACETAMINOPHEN 5-325 MG PO TABS
1.0000 | ORAL_TABLET | Freq: Once | ORAL | Status: AC
  Administered 2016-09-08: 2 via ORAL

## 2016-09-08 SURGICAL SUPPLY — 56 items
APL SKNCLS STERI-STRIP NONHPOA (GAUZE/BANDAGES/DRESSINGS) ×1
BENZOIN TINCTURE PRP APPL 2/3 (GAUZE/BANDAGES/DRESSINGS) ×2 IMPLANT
BLADE SURG 10 STRL SS (BLADE) ×2 IMPLANT
CANISTER SUCT 3000ML PPV (MISCELLANEOUS) ×2 IMPLANT
DRAPE C-ARM 42X72 X-RAY (DRAPES) ×2 IMPLANT
DRAPE C-ARMOR (DRAPES) ×2 IMPLANT
DRAPE INCISE IOBAN 66X45 STRL (DRAPES) ×2 IMPLANT
DRAPE POUCH INSTRU U-SHP 10X18 (DRAPES) ×2 IMPLANT
DRAPE SURG 17X23 STRL (DRAPES) ×6 IMPLANT
DURAPREP 26ML APPLICATOR (WOUND CARE) ×2 IMPLANT
ELECT CAUTERY BLADE 6.4 (BLADE) ×2 IMPLANT
ELECT REM PT RETURN 9FT ADLT (ELECTROSURGICAL) ×2
ELECTRODE REM PT RTRN 9FT ADLT (ELECTROSURGICAL) ×1 IMPLANT
GAUZE SPONGE 4X4 12PLY STRL (GAUZE/BANDAGES/DRESSINGS) ×2 IMPLANT
GAUZE SPONGE 4X4 12PLY STRL LF (GAUZE/BANDAGES/DRESSINGS) ×1 IMPLANT
GAUZE SPONGE 4X4 16PLY XRAY LF (GAUZE/BANDAGES/DRESSINGS) ×2 IMPLANT
GLOVE BIO SURGEON STRL SZ7 (GLOVE) ×2 IMPLANT
GLOVE BIO SURGEON STRL SZ8 (GLOVE) ×2 IMPLANT
GLOVE BIOGEL PI IND STRL 7.0 (GLOVE) ×1 IMPLANT
GLOVE BIOGEL PI IND STRL 8 (GLOVE) ×1 IMPLANT
GLOVE BIOGEL PI INDICATOR 7.0 (GLOVE) ×1
GLOVE BIOGEL PI INDICATOR 8 (GLOVE) ×1
GOWN STRL REUS W/ TWL LRG LVL3 (GOWN DISPOSABLE) ×2 IMPLANT
GOWN STRL REUS W/ TWL XL LVL3 (GOWN DISPOSABLE) ×1 IMPLANT
GOWN STRL REUS W/TWL LRG LVL3 (GOWN DISPOSABLE) ×6
GOWN STRL REUS W/TWL XL LVL3 (GOWN DISPOSABLE) ×4
IMPL IFUSE 7.0MM X 65MM (Rod) IMPLANT
IMPL IFUSE 7.0X50MM (Rod) IMPLANT
IMPLANT IFUSE 7.0MM X 65MM (Rod) ×2 IMPLANT
IMPLANT IFUSE 7.0X50MM (Rod) ×4 IMPLANT
KIT BASIN OR (CUSTOM PROCEDURE TRAY) ×2 IMPLANT
KIT ROOM TURNOVER OR (KITS) ×2 IMPLANT
MANIFOLD NEPTUNE II (INSTRUMENTS) ×2 IMPLANT
NDL HYPO 25GX1X1/2 BEV (NEEDLE) ×1 IMPLANT
NEEDLE 22X1 1/2 (OR ONLY) (NEEDLE) ×2 IMPLANT
NEEDLE HYPO 25GX1X1/2 BEV (NEEDLE) ×2 IMPLANT
NS IRRIG 1000ML POUR BTL (IV SOLUTION) ×2 IMPLANT
PACK UNIVERSAL I (CUSTOM PROCEDURE TRAY) ×2 IMPLANT
PAD ARMBOARD 7.5X6 YLW CONV (MISCELLANEOUS) ×4 IMPLANT
PENCIL BUTTON HOLSTER BLD 10FT (ELECTRODE) ×2 IMPLANT
SPONGE LAP 18X18 X RAY DECT (DISPOSABLE) ×2 IMPLANT
STAPLER VISISTAT 35W (STAPLE) ×2 IMPLANT
STRIP CLOSURE SKIN 1/2X4 (GAUZE/BANDAGES/DRESSINGS) ×2 IMPLANT
SUT MNCRL AB 4-0 PS2 18 (SUTURE) ×2 IMPLANT
SUT VIC AB 0 CT1 18XCR BRD 8 (SUTURE) IMPLANT
SUT VIC AB 0 CT1 8-18 (SUTURE)
SUT VIC AB 1 CT1 18XCR BRD 8 (SUTURE) ×1 IMPLANT
SUT VIC AB 1 CT1 8-18 (SUTURE) ×2
SUT VIC AB 2-0 CT2 18 VCP726D (SUTURE) ×2 IMPLANT
SYR BULB IRRIGATION 50ML (SYRINGE) ×5 IMPLANT
SYR CONTROL 10ML LL (SYRINGE) ×2 IMPLANT
TAPE CLOTH SURG 4X10 WHT LF (GAUZE/BANDAGES/DRESSINGS) ×1 IMPLANT
TOWEL OR 17X24 6PK STRL BLUE (TOWEL DISPOSABLE) ×2 IMPLANT
TOWEL OR 17X26 10 PK STRL BLUE (TOWEL DISPOSABLE) ×4 IMPLANT
TUBE CONNECTING 12X1/4 (SUCTIONS) ×2 IMPLANT
WATER STERILE IRR 1000ML POUR (IV SOLUTION) ×2 IMPLANT

## 2016-09-08 NOTE — Anesthesia Procedure Notes (Signed)
Procedure Name: Intubation Performed by: Belinda Block Pre-anesthesia Checklist: Patient identified, Emergency Drugs available, Patient being monitored, Suction available and Timeout performed Patient Re-evaluated:Patient Re-evaluated prior to inductionOxygen Delivery Method: Circle system utilized Preoxygenation: Pre-oxygenation with 100% oxygen Intubation Type: IV induction Ventilation: Mask ventilation without difficulty Grade View: Grade I Tube type: Oral Tube size: 8.0 mm Number of attempts: 1 Airway Equipment and Method: Stylet Placement Confirmation: ETT inserted through vocal cords under direct vision,  positive ETCO2 and breath sounds checked- equal and bilateral Secured at: 22 cm Tube secured with: Tape Dental Injury: Teeth and Oropharynx as per pre-operative assessment

## 2016-09-08 NOTE — Progress Notes (Signed)
Orthopedic Tech Progress Note Patient Details:  Christian Ward 1950/10/02 [id number]  Ortho Devices Type of Ortho Device: Crutches Ortho Device/Splint Interventions: Application   [name] 09/08/2016, 11:48 AM Viewed order from doctor's order list

## 2016-09-08 NOTE — Anesthesia Preprocedure Evaluation (Signed)
Anesthesia Evaluation  Patient identified by MRN, date of birth, ID band Patient awake    Reviewed: Allergy & Precautions, NPO status , Patient's Chart, lab work & pertinent test results  Airway Mallampati: II  TM Distance: >3 FB Neck ROM: Full    Dental no notable dental hx.    Pulmonary neg pulmonary ROS, former smoker,    Pulmonary exam normal breath sounds clear to auscultation       Cardiovascular hypertension, Normal cardiovascular exam Rhythm:Regular Rate:Normal     Neuro/Psych negative neurological ROS  negative psych ROS   GI/Hepatic negative GI ROS, Neg liver ROS,   Endo/Other  negative endocrine ROS  Renal/GU negative Renal ROS  negative genitourinary   Musculoskeletal negative musculoskeletal ROS (+)   Abdominal   Peds negative pediatric ROS (+)  Hematology negative hematology ROS (+)   Anesthesia Other Findings   Reproductive/Obstetrics negative OB ROS                             Anesthesia Physical Anesthesia Plan  ASA: II  Anesthesia Plan: General   Post-op Pain Management:    Induction: Intravenous  Airway Management Planned: Oral ETT  Additional Equipment:   Intra-op Plan:   Post-operative Plan: Extubation in OR  Informed Consent: I have reviewed the patients History and Physical, chart, labs and discussed the procedure including the risks, benefits and alternatives for the proposed anesthesia with the patient or authorized representative who has indicated his/her understanding and acceptance.   Dental advisory given  Plan Discussed with: CRNA and Surgeon  Anesthesia Plan Comments:         Anesthesia Quick Evaluation

## 2016-09-08 NOTE — Transfer of Care (Cosign Needed)
Immediate Anesthesia Transfer of Care Note  Patient: Christian Ward  Procedure(s) Performed: Procedure(s) with comments: RIGHT SIDED SACROILIAC JOINT FUSION (Right) - RIGHT SIDED SACROILIAC JOINT FUSION  Patient Location: PACU  Anesthesia Type:General  Level of Consciousness: awake, alert , oriented and patient cooperative  Airway & Oxygen Therapy: Patient Spontanous Breathing and Patient connected to nasal cannula oxygen  Post-op Assessment: Report given to RN, Post -op Vital signs reviewed and stable and Patient moving all extremities  Post vital signs: Reviewed and stable  Last Vitals:  Vitals:   09/08/16 0630  BP: (!) 164/88  Pulse: 65  Resp: 20  Temp: 36.9 C    Last Pain:  Vitals:   09/08/16 0630  TempSrc: Oral         Complications: No apparent anesthesia complications

## 2016-09-08 NOTE — Anesthesia Postprocedure Evaluation (Signed)
Anesthesia Post Note  Patient: Christian Ward  Procedure(s) Performed: Procedure(s) (LRB): RIGHT SIDED SACROILIAC JOINT FUSION (Right)  Patient location during evaluation: PACU Anesthesia Type: General Level of consciousness: awake and alert Pain management: pain level controlled Vital Signs Assessment: post-procedure vital signs reviewed and stable Respiratory status: spontaneous breathing, nonlabored ventilation, respiratory function stable and patient connected to nasal cannula oxygen Cardiovascular status: blood pressure returned to baseline and stable Postop Assessment: no signs of nausea or vomiting Anesthetic complications: no       Last Vitals:  Vitals:   09/08/16 0854 09/08/16 0900  BP:  136/80  Pulse:  87  Resp:  13  Temp: 36.4 C     Last Pain:  Vitals:   09/08/16 0630  TempSrc: Oral                 Edith Groleau S

## 2016-09-09 ENCOUNTER — Encounter (HOSPITAL_COMMUNITY): Payer: Self-pay | Admitting: Orthopedic Surgery

## 2016-09-09 NOTE — Anesthesia Procedure Notes (Signed)
Performed by: London Nonaka E     

## 2016-09-09 NOTE — Op Note (Signed)
Christian Ward, Christian Ward NO.:  [id number]  MEDICAL RECORD NO.:  [id number]  LOCATION:                                 FACILITY:  PHYSICIAN:  Phylliss Bob, MD      DATE OF BIRTH:  [birth year]  DATE OF PROCEDURE:  09/08/2016 DATE OF DISCHARGE:  09/08/2016                              OPERATIVE REPORT   PREOPERATIVE DIAGNOSIS:  Right-sided sacroiliac joint dysfunction.  POSTOPERATIVE DIAGNOSIS:  Right-sided sacroiliac joint dysfunction.  PROCEDURE:  Right-sided sacroiliac joint fusion using the iFuse sacroiliac joint fusion system.  SURGEON:  Phylliss Bob, MD  ASSISTANT:  Pricilla Holm, PA-C.  ANESTHESIA:  General endotracheal anesthesia.  COMPLICATIONS:  None.  DISPOSITION:  Stable.  ESTIMATED BLOOD LOSS:  Minimal.  INDICATIONS FOR SURGERY:  Briefly, [name] is a pleasant 66 year old male who did present to me with ongoing severe pain in the right and left sides of his low back, right greater than left.  His history and exam were consistent with right sacroiliac joint dysfunction.  Of note, on his imaging, it was notable that he may have had a partial fusion across the right sacroiliac joint, which did make me initially question whether any of his pain was from sacroiliac joint dysfunction, however, he was very clear in stating that he did gain excellent temporary benefit with a diagnostic right sacroiliac joint injection.  Given this, we did discuss the pros and cons of proceeding with the procedure reflected above and the patient did elect to proceed.  OPERATIVE DETAILS:  On Sep 08, 2016, the patient was brought to surgery and general endotracheal anesthesia was administered.  The patient was placed prone on a flat Jackson bed.  Gel rolls were placed under the patient's chest and hips.  Antibiotics were given and a time-out procedure was performed.  The right buttock was prepped and draped in the usual sterile fashion.  I then made a 3-cm  incision in line with the posterior border of the sacrum.  The guidewires were then advanced across the sacroiliac joint.  One just above the S1 foramen, one in line with the S1 foramina, and one just beneath it.  I then drilled and broached over the guidewires.  Of note, I did liberally use lateral inlet and outlet fluoroscopy while placing the guidewires.  I then placed 7 mm implants of the appropriate length from inferior to superior.  From inferior to superior, the lengths of the implants were 50 mm, 50 mm, and 65 mm.  I was very meticulous about ensuring that the guidewires or implants did not traverse the S1 foramen.  Once the implants were appropriately secured, the guidewires were removed and the wound was copiously irrigated.  The wound was then closed in layers using #1 Vicryl followed by 2-0 Vicryl, followed by 4-0 Monocryl. Benzoin and Steri-Strips were applied followed by sterile dressing.  All instrument counts were correct at the termination of the procedure.  Of note, Pricilla Holm was my assistant throughout surgery, and did aid in retraction, suctioning, and closure from start to finish.     Phylliss Bob, MD   ______________________________ Phylliss Bob, MD    MD/MEDQ  D:  09/08/2016  T:  09/08/2016  Job:  [id number]

## 2016-09-09 NOTE — Addendum Note (Signed)
Addendum  created 09/09/16 0816 by Scheryl Darter, CRNA   Anesthesia Intra Blocks edited, Child order released for a procedure order, Cosign clinical note, Pend clinical note, Sign clinical note

## 2016-09-26 DIAGNOSIS — I82811 Embolism and thrombosis of superficial veins of right lower extremities: Secondary | ICD-10-CM | POA: Diagnosis not present

## 2016-09-26 DIAGNOSIS — Z9889 Other specified postprocedural states: Secondary | ICD-10-CM | POA: Diagnosis not present

## 2016-10-10 NOTE — Anesthesia Postprocedure Evaluation (Signed)
Anesthesia Post Note  Patient: Christian Ward  Procedure(s) Performed: Procedure(s) (LRB): RIGHT SIDED SACROILIAC JOINT FUSION (Right)     Anesthesia Post Evaluation  Last Vitals:  Vitals:   09/08/16 1100 09/08/16 1113  BP: 118/72 138/72  Pulse: 84 80  Resp: 18 16  Temp: 36.7 C     Last Pain:  Vitals:   09/08/16 1113  TempSrc:   PainSc: 0-No pain                 Sariyah Corcino S

## 2016-10-10 NOTE — Addendum Note (Signed)
Addendum  created 10/10/16 1415 by Adi Doro, MD   Sign clinical note    

## 2016-10-17 DIAGNOSIS — M533 Sacrococcygeal disorders, not elsewhere classified: Secondary | ICD-10-CM | POA: Diagnosis not present

## 2016-10-24 DIAGNOSIS — M545 Low back pain: Secondary | ICD-10-CM | POA: Diagnosis not present

## 2016-10-24 DIAGNOSIS — M79604 Pain in right leg: Secondary | ICD-10-CM | POA: Diagnosis not present

## 2016-10-26 DIAGNOSIS — M79604 Pain in right leg: Secondary | ICD-10-CM | POA: Diagnosis not present

## 2016-10-26 DIAGNOSIS — M545 Low back pain: Secondary | ICD-10-CM | POA: Diagnosis not present

## 2016-11-01 DIAGNOSIS — M545 Low back pain: Secondary | ICD-10-CM | POA: Diagnosis not present

## 2016-11-01 DIAGNOSIS — M79604 Pain in right leg: Secondary | ICD-10-CM | POA: Diagnosis not present

## 2016-11-03 DIAGNOSIS — M79604 Pain in right leg: Secondary | ICD-10-CM | POA: Diagnosis not present

## 2016-11-03 DIAGNOSIS — M545 Low back pain: Secondary | ICD-10-CM | POA: Diagnosis not present

## 2016-11-08 DIAGNOSIS — M79604 Pain in right leg: Secondary | ICD-10-CM | POA: Diagnosis not present

## 2016-11-08 DIAGNOSIS — M545 Low back pain: Secondary | ICD-10-CM | POA: Diagnosis not present

## 2016-11-10 DIAGNOSIS — M79604 Pain in right leg: Secondary | ICD-10-CM | POA: Diagnosis not present

## 2016-11-10 DIAGNOSIS — M545 Low back pain: Secondary | ICD-10-CM | POA: Diagnosis not present

## 2016-11-14 DIAGNOSIS — M461 Sacroiliitis, not elsewhere classified: Secondary | ICD-10-CM | POA: Diagnosis not present

## 2016-11-15 DIAGNOSIS — M545 Low back pain: Secondary | ICD-10-CM | POA: Diagnosis not present

## 2016-11-15 DIAGNOSIS — M79604 Pain in right leg: Secondary | ICD-10-CM | POA: Diagnosis not present

## 2016-11-17 DIAGNOSIS — M79604 Pain in right leg: Secondary | ICD-10-CM | POA: Diagnosis not present

## 2016-11-17 DIAGNOSIS — M545 Low back pain: Secondary | ICD-10-CM | POA: Diagnosis not present

## 2016-11-21 DIAGNOSIS — C61 Malignant neoplasm of prostate: Secondary | ICD-10-CM | POA: Diagnosis not present

## 2016-11-22 DIAGNOSIS — M79604 Pain in right leg: Secondary | ICD-10-CM | POA: Diagnosis not present

## 2016-11-22 DIAGNOSIS — M545 Low back pain: Secondary | ICD-10-CM | POA: Diagnosis not present

## 2016-11-28 DIAGNOSIS — M79604 Pain in right leg: Secondary | ICD-10-CM | POA: Diagnosis not present

## 2016-11-28 DIAGNOSIS — M25571 Pain in right ankle and joints of right foot: Secondary | ICD-10-CM | POA: Diagnosis not present

## 2016-11-28 DIAGNOSIS — M545 Low back pain: Secondary | ICD-10-CM | POA: Diagnosis not present

## 2016-12-06 DIAGNOSIS — M79604 Pain in right leg: Secondary | ICD-10-CM | POA: Diagnosis not present

## 2016-12-06 DIAGNOSIS — M545 Low back pain: Secondary | ICD-10-CM | POA: Diagnosis not present

## 2016-12-08 DIAGNOSIS — M79604 Pain in right leg: Secondary | ICD-10-CM | POA: Diagnosis not present

## 2016-12-08 DIAGNOSIS — M545 Low back pain: Secondary | ICD-10-CM | POA: Diagnosis not present

## 2016-12-13 DIAGNOSIS — M545 Low back pain: Secondary | ICD-10-CM | POA: Diagnosis not present

## 2016-12-13 DIAGNOSIS — M79604 Pain in right leg: Secondary | ICD-10-CM | POA: Diagnosis not present

## 2016-12-18 ENCOUNTER — Encounter (HOSPITAL_COMMUNITY): Payer: Self-pay | Admitting: Emergency Medicine

## 2016-12-18 ENCOUNTER — Emergency Department (HOSPITAL_COMMUNITY)
Admission: EM | Admit: 2016-12-18 | Discharge: 2016-12-18 | Disposition: A | Discharge status: Home / Self Care | Payer: Medicare Other | Attending: Emergency Medicine | Admitting: Emergency Medicine

## 2016-12-18 DIAGNOSIS — Y939 Activity, unspecified: Secondary | ICD-10-CM | POA: Diagnosis not present

## 2016-12-18 DIAGNOSIS — Y999 Unspecified external cause status: Secondary | ICD-10-CM | POA: Diagnosis not present

## 2016-12-18 DIAGNOSIS — Z87891 Personal history of nicotine dependence: Secondary | ICD-10-CM | POA: Insufficient documentation

## 2016-12-18 DIAGNOSIS — Z8546 Personal history of malignant neoplasm of prostate: Secondary | ICD-10-CM | POA: Diagnosis not present

## 2016-12-18 DIAGNOSIS — Y929 Unspecified place or not applicable: Secondary | ICD-10-CM | POA: Insufficient documentation

## 2016-12-18 DIAGNOSIS — I10 Essential (primary) hypertension: Secondary | ICD-10-CM | POA: Diagnosis not present

## 2016-12-18 DIAGNOSIS — S80862A Insect bite (nonvenomous), left lower leg, initial encounter: Secondary | ICD-10-CM | POA: Diagnosis not present

## 2016-12-18 DIAGNOSIS — Z885 Allergy status to narcotic agent status: Secondary | ICD-10-CM | POA: Insufficient documentation

## 2016-12-18 DIAGNOSIS — Z79899 Other long term (current) drug therapy: Secondary | ICD-10-CM | POA: Insufficient documentation

## 2016-12-18 DIAGNOSIS — R21 Rash and other nonspecific skin eruption: Secondary | ICD-10-CM | POA: Insufficient documentation

## 2016-12-18 DIAGNOSIS — M79606 Pain in leg, unspecified: Secondary | ICD-10-CM | POA: Insufficient documentation

## 2016-12-18 DIAGNOSIS — W57XXXA Bitten or stung by nonvenomous insect and other nonvenomous arthropods, initial encounter: Secondary | ICD-10-CM | POA: Diagnosis not present

## 2016-12-18 LAB — BASIC METABOLIC PANEL
Anion gap: 8 (ref 5–15)
BUN: 19 mg/dL (ref 6–20)
CO2: 26 mmol/L (ref 22–32)
Calcium: 9.3 mg/dL (ref 8.9–10.3)
Chloride: 105 mmol/L (ref 101–111)
Creatinine, Ser: 0.99 mg/dL (ref 0.61–1.24)
GFR calc Af Amer: >60 mL/min (ref >60)
GFR calc non Af Amer: >60 mL/min (ref >60)
Glucose, Bld: 101 mg/dL — ABNORMAL HIGH (ref 65–99)
Potassium: 4.3 mmol/L (ref 3.5–5.1)
Sodium: 139 mmol/L (ref 135–145)

## 2016-12-18 LAB — CBC WITH DIFFERENTIAL/PLATELET
Basophils Absolute: 0 10*3/uL (ref 0.0–0.1)
Basophils Relative: 0 %
Eosinophils Absolute: 0.2 10*3/uL (ref 0.0–0.7)
Eosinophils Relative: 3 %
HCT: 39.2 % (ref 39.0–52.0)
Hemoglobin: 13.1 g/dL (ref 13.0–17.0)
Lymphocytes Relative: 15 %
Lymphs Abs: 1.2 10*3/uL (ref 0.7–4.0)
MCH: 30.1 pg (ref 26.0–34.0)
MCHC: 33.4 g/dL (ref 30.0–36.0)
MCV: 90.1 fL (ref 78.0–100.0)
Monocytes Absolute: 0.6 10*3/uL (ref 0.1–1.0)
Monocytes Relative: 7 %
Neutro Abs: 6.4 10*3/uL (ref 1.7–7.7)
Neutrophils Relative %: 75 %
Platelets: 193 10*3/uL (ref 150–400)
RBC: 4.35 MIL/uL (ref 4.22–5.81)
RDW: 13.2 % (ref 11.5–15.5)
WBC: 8.4 10*3/uL (ref 4.0–10.5)

## 2016-12-18 MED ORDER — FAMOTIDINE 20 MG PO TABS: 20.0000 mg | ORAL_TABLET | Freq: Two times a day (BID) | ORAL | 0 refills | Status: DC

## 2016-12-18 MED ORDER — PREDNISONE 20 MG PO TABS
40.0000 mg | ORAL_TABLET | Freq: Once | ORAL | Status: AC
  Administered 2016-12-18: 40 mg via ORAL
  Filled 2016-12-18: qty 2

## 2016-12-18 MED ORDER — PREDNISONE 10 MG PO TABS: 40.0000 mg | ORAL_TABLET | Freq: Every day | ORAL | 0 refills | Status: AC

## 2016-12-18 MED ORDER — SODIUM CHLORIDE 0.9 % IV BOLUS (SEPSIS)
1000.0000 mL | Freq: Once | INTRAVENOUS | Status: AC
  Administered 2016-12-18: 1000 mL via INTRAVENOUS

## 2016-12-18 NOTE — Discharge Instructions (Signed)
Take prednisone as prescribed starting tomorrow. You have received her first dose today while in the emergency Department. You may also take Pepcid as needed for itching. Stay well hydrated and get plenty of rest. Continue using your Celebrex as needed for pain. Apply ice or heat to the affected area for comfort. Follow-up with your primary care physician or a dermatologist for reevaluation of your symptoms this week. Return to the ED immediately if any concerning signs or symptoms develop such as fevers, swelling to your lower legs, loss of pulses, severe pain, or worsening rash.

## 2016-12-18 NOTE — ED Provider Notes (Signed)
[facility] DEPT Provider Note   CSN: [id number] Arrival date & time: 12/18/16  1110     History   Chief Complaint Chief Complaint  Patient presents with  . Insect Bite  . Leg Pain    HPI Christian Ward is a 66 y.o. male with history of GERD, fibromyalgia, HTN, hypercholesterolemia, prostate adenocarcinoma, and IBS who presents today with chief complaint acute onset, somewhat improving rash to the bilateral lower extremities with associated muscle aches. He states that 3 days ago he was driving home when he noted development of itching to his bilateral thighs. He states he was wearing a seat pants and a knee length dress sock to the left foot and knee length compression stocking to the right foot. He states that itching progressively worsened throughout the evening and when he changed his pants he noted multiple erythematous spots that have developed on his bilateral lower extremities, sparing the area in which the compression stocking was in place. He went to a pharmacy, who states he got it was an insect bite and gave him topical hydrocortisone cream and oral Benadryl. The following day he endorses developing of bilateral persistent muscle aches to the lower extremities, somewhat alleviated by Celexa. He denies numbness, tingling, or weakness. Urine is not dark. No fevers or chills. No observed insect bite. No new lotions, soaps, detergents, or shampoos. He denies abdominal pain, chest pain, or shortness of breath, but states he does feel nauseous which evinces related to the pain from the muscle cramps. He states that since the onset of the rash, some of the lesions have gotten smaller.  Of note, he had a hip replacement on the right 3 months ago which has resulted in lower extremity edema which is improving with the use of the compression stocking and rehabilitation.  The history is provided by the patient.    Past Medical History:  Diagnosis Date  . Acid reflux   . Cancer ([place])     . 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 Complication of anesthesia    trouble waking up  . Fibromyalgia   . H/O infectious mononucleosis   . History of shingles   . Hypercholesterolemia   . IBS (irritable bowel syndrome)   . 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 S/P radiation therapy   . Shoulder pain, left     Patient Active Problem List   Diagnosis Date Noted  . Essential hypertension 07/16/2016  . Depression with anxiety 07/14/2016  . Constipation 08/06/2015  . LBP (low back pain) 03/15/2014  . Physical exam, annual 03/14/2014  . GERD (gastroesophageal reflux disease) 09/29/2011  . HYPERCHOLESTEROLEMIA, MILD 04/06/2009  . ADENOCARCINOMA, PROSTATE 04/09/2007  . Irritable bowel syndrome 04/09/2007    Past Surgical History:  Procedure Laterality Date  . APPENDECTOMY  age 45  . CHOLECYSTECTOMY  s/p lap chole 4/07   Dr Dalbert Batman  . COLONOSCOPY    . PROSTATE BIOPSY  2008, 10/2008  . PROSTATECTOMY  08/06/2009   Gleason 4+3=7  . SACROILIAC JOINT FUSION Right 09/08/2016   Procedure: RIGHT SIDED SACROILIAC JOINT FUSION;  Surgeon: Phylliss Bob, MD;  Location: [facility];  Service: Orthopedics;  Laterality: Right;  RIGHT SIDED SACROILIAC JOINT FUSION  . WISDOM TOOTH EXTRACTION         Home Medications    Prior to Admission medications   Medication Sig Start Date End Date Taking? Authorizing Provider  acetaminophen (TYLENOL) 500 MG tablet Take 500 mg by mouth every 6 (six)  hours as needed for mild pain.   Yes [provider]  CELEBREX 200 MG capsule Take 200 mg by mouth daily. 12/16/16  Yes [provider]  Cholecalciferol (VITAMIN D PO) Take 1 tablet by mouth daily.   Yes [provider]  fluticasone (FLONASE) 50 MCG/ACT nasal spray Place 1 spray into both nostrils daily as needed for allergies.    Yes [provider]  Magnesium 250 MG TABS Take 250 mg by mouth daily.   Yes [provider]  Multiple Vitamin (MULTI VITAMIN  DAILY PO) Take 1 tablet by mouth daily.    Yes [provider]  omeprazole (PRILOSEC) 20 MG capsule Take 20 mg by mouth 2 (two) times daily as needed (acid reflux). Takes 1 daily and may take a second dose if needed   Yes [provider]  ondansetron (ZOFRAN) 4 MG tablet Take 2-4 mg by mouth at bedtime as needed for nausea.  08/14/16  Yes [provider]  Probiotic Product (PROBIOTIC DAILY) CAPS Take 1 capsule by mouth daily.   Yes [provider]  promethazine (PHENERGAN) 25 MG tablet Take 12.5 mg by mouth every 6 (six) hours as needed for nausea or vomiting.   Yes [provider]  famotidine (PEPCID) 20 MG tablet Take 1 tablet (20 mg total) by mouth 2 (two) times daily. 12/18/16   Nils Flack, Jace Dowe A, PA-C  predniSONE (DELTASONE) 10 MG tablet Take 4 tablets (40 mg total) by mouth daily with breakfast. 12/18/16 12/22/16  Renita Papa, PA-C    Family History Family History  Problem Relation Age of Onset  . Hypertension Father   . Cancer Father        prostate  . Stroke Father   . COPD Father   . Coronary artery disease Father   . Lung disease Father   . Heart disease Father   . Diabetes Father   . Lung disease Mother   . Diabetes Sister   . Cancer Brother        surgery    Social History Social History  Substance Use Topics  . Smoking status: Former Smoker    Packs/day: 0.50    Years: 10.00    Types: Cigarettes    Start date: 05/09/1980    Quit date: 03/16/1986  . Smokeless tobacco: Never Used  . Alcohol use 0.0 oz/week     Comment: weekend     Allergies   Tramadol and Oxycodone   Review of Systems Review of Systems  Constitutional: Negative for chills and fever.  Respiratory: Negative for shortness of breath.   Cardiovascular: Negative for chest pain.  Musculoskeletal: Positive for myalgias. Negative for arthralgias.  Skin: Positive for rash.  Neurological: Negative for weakness and numbness.  All other systems reviewed and are  negative.    Physical Exam Updated Vital Signs BP (!) 149/88 (BP Location: Left Arm)   Pulse 65   Temp 98.1 F (36.7 C)   Resp 16   Wt 99.8 kg (220 lb)   SpO2 100%   BMI 26.78 kg/m   Physical Exam  Constitutional: He appears well-developed and well-nourished. No distress.  HENT:  Head: Normocephalic and atraumatic.  Eyes: Conjunctivae are normal. Right eye exhibits no discharge. Left eye exhibits no discharge.  Neck: Normal range of motion. No JVD present. No tracheal deviation present.  Cardiovascular: Normal rate and intact distal pulses.   2+ DP/PT pulses bl, negative Homan's bl   Pulmonary/Chest: Effort normal.  Abdominal: He exhibits  no distension.  Musculoskeletal: Normal range of motion. He exhibits edema.  Mild right ankle edema, which the patient states has improved. 5/5 strength of BLE major muscle groups.   Neurological: He is alert.  Fluent speech, no facial droop, sensation intact to soft touch of bilateral lower extremities  Skin: Skin is warm and dry. Capillary refill takes less than 2 seconds. Rash noted.  Erythematous maculopapular rash localized to the bilateral lower extremities, sparing the soles of the feet. Most lesions are <71mm, some are raised and some are not. There are a few lesions >49mm primarily to the left thigh. No induration, fluctuance, or tenderness to palpation. The rash spares the location at which the compression stocking was placed on the right leg.  Psychiatric: He has a normal mood and affect. His behavior is normal.  Nursing note and vitals reviewed.      ED Treatments / Results  Labs (all labs ordered are listed, but only abnormal results are displayed) Labs Reviewed  BASIC METABOLIC PANEL - Abnormal; Notable for the following:       Result Value   Glucose, Bld 101 (*)    All other components within normal limits  CBC WITH DIFFERENTIAL/PLATELET    EKG  EKG Interpretation None       Radiology No results  found.  Procedures Procedures (including critical care time)  Medications Ordered in ED Medications  sodium chloride 0.9 % bolus 1,000 mL (1,000 mLs Intravenous New Bag/Given 12/18/16 1530)  predniSONE (DELTASONE) tablet 40 mg (40 mg Oral Given 12/18/16 1607)     Initial Impression / Assessment and Plan / ED Course  I have reviewed the triage vital signs and the nursing notes.  Pertinent labs & imaging results that were available during my care of the patient were reviewed by me and considered in my medical decision making (see chart for details).     Patient with erythematous maculopapular rash to the bilateral lower extremity sparing the area in which the patient was wearing a compression stocking when the symptoms began. Associated symptoms include muscle aches to the bilateral lower extremities. Afebrile, his vital signs are at his baseline. He is neurovascularly intact. No evidence of superimposed skin infection. Rash not consistent with [facility] spotted fever or Lyme disease. No leukocytosis or electrolyte abnormality on his lab work. Rash consistent with multiple insect bites. Patient denies any difficulty breathing or swallowing.  Pt has a patent airway without stridor and is handling secretions without difficulty; no angioedema. No blisters, no pustules, no warmth, no draining sinus tracts, no superficial abscesses, no bullous impetigo, no vesicles, no desquamation, no target lesions with dusky purpura or a central bulla. Not tender to touch. No concern for superimposed infection. No concern for SJS, TEN, TSS, tick borne illness, syphilis or other life-threatening condition. Will discharge home with short course of steroids, pepcid and recommend continued use of Benadryl as needed for pruritis as well as Celebrex for pain. He will follow up with primary care physician or dermatologist this week for reevaluation. Discussed indications for return to the ED. Pt verbalized understanding  of and agreement with plan and is safe for discharge home at this time.  Final Clinical Impressions(s) / ED Diagnoses   Final diagnoses:  Insect bite, initial encounter    New Prescriptions New Prescriptions   FAMOTIDINE (PEPCID) 20 MG TABLET    Take 1 tablet (20 mg total) by mouth 2 (two) times daily.   PREDNISONE (DELTASONE) 10 MG TABLET  Take 4 tablets (40 mg total) by mouth daily with breakfast.     Renita Papa, PA-C 12/18/16 [place], [place], DO 12/19/16 1629

## 2016-12-18 NOTE — ED Triage Notes (Signed)
Pt has multiple red raised areas(approx 40) on both legs Reports increased muscle soreness and cramping in legs x 24 hours. Pt treated cramps with Gatorade, and Celebrex. Noted some relief with Celebrex. Pt c/o itching on both legs x 3 days. Tx itching with po benadryl and topical cortisone cream.Pt reports that his very concerned about a possible infection or severe reaction to a multiple bug bites.  Pt denies feeling any bites prior to seeing red spots

## 2016-12-18 NOTE — ED Notes (Signed)
Bed: [facility]1 Expected date:  Expected time:  Means of arrival:  Comments: 

## 2016-12-18 NOTE — ED Notes (Signed)
Patient has 400 mL of bolus left to infuse before leaving.

## 2016-12-20 DIAGNOSIS — M545 Low back pain: Secondary | ICD-10-CM | POA: Diagnosis not present

## 2016-12-20 DIAGNOSIS — M79604 Pain in right leg: Secondary | ICD-10-CM | POA: Diagnosis not present

## 2016-12-22 DIAGNOSIS — M79604 Pain in right leg: Secondary | ICD-10-CM | POA: Diagnosis not present

## 2016-12-22 DIAGNOSIS — M545 Low back pain: Secondary | ICD-10-CM | POA: Diagnosis not present

## 2017-01-04 DIAGNOSIS — M545 Low back pain: Secondary | ICD-10-CM | POA: Diagnosis not present

## 2017-01-04 DIAGNOSIS — M79604 Pain in right leg: Secondary | ICD-10-CM | POA: Diagnosis not present

## 2017-01-05 DIAGNOSIS — M79604 Pain in right leg: Secondary | ICD-10-CM | POA: Diagnosis not present

## 2017-01-05 DIAGNOSIS — M545 Low back pain: Secondary | ICD-10-CM | POA: Diagnosis not present

## 2017-01-11 DIAGNOSIS — M545 Low back pain: Secondary | ICD-10-CM | POA: Diagnosis not present

## 2017-01-11 DIAGNOSIS — M79604 Pain in right leg: Secondary | ICD-10-CM | POA: Diagnosis not present

## 2017-01-13 DIAGNOSIS — M533 Sacrococcygeal disorders, not elsewhere classified: Secondary | ICD-10-CM | POA: Diagnosis not present

## 2017-01-17 DIAGNOSIS — M545 Low back pain: Secondary | ICD-10-CM | POA: Diagnosis not present

## 2017-01-17 DIAGNOSIS — M79604 Pain in right leg: Secondary | ICD-10-CM | POA: Diagnosis not present

## 2017-01-24 DIAGNOSIS — M545 Low back pain: Secondary | ICD-10-CM | POA: Diagnosis not present

## 2017-01-24 DIAGNOSIS — M79604 Pain in right leg: Secondary | ICD-10-CM | POA: Diagnosis not present

## 2017-01-25 ENCOUNTER — Ambulatory Visit: Payer: Medicare Other | Admitting: Internal Medicine

## 2017-01-31 ENCOUNTER — Encounter: Payer: Self-pay | Admitting: Internal Medicine

## 2017-01-31 ENCOUNTER — Ambulatory Visit (INDEPENDENT_AMBULATORY_CARE_PROVIDER_SITE_OTHER): Payer: Medicare Other | Admitting: Internal Medicine

## 2017-01-31 VITALS — BP 126/80 | HR 67 | Temp 98.1°F | Resp 16 | Wt 224.0 lb

## 2017-01-31 DIAGNOSIS — I872 Venous insufficiency (chronic) (peripheral): Secondary | ICD-10-CM | POA: Diagnosis not present

## 2017-01-31 DIAGNOSIS — M79604 Pain in right leg: Secondary | ICD-10-CM | POA: Diagnosis not present

## 2017-01-31 DIAGNOSIS — Z23 Encounter for immunization: Secondary | ICD-10-CM | POA: Diagnosis not present

## 2017-01-31 DIAGNOSIS — M545 Low back pain: Secondary | ICD-10-CM | POA: Diagnosis not present

## 2017-01-31 MED ORDER — VASCULERA PO TABS: 1.0000 | ORAL_TABLET | Freq: Every day | ORAL | 11 refills | Status: DC

## 2017-01-31 NOTE — Progress Notes (Signed)
Subjective:  Patient ID: Christian Ward, male    DOB: 07-16-1950  Age: 66 y.o. MRN: [id number]  CC: Leg Swelling   HPI Christian Ward presents for concerns about an area of redness and swelling just above his right ankle. About 3 months ago he had sacroiliac fusion and for several weeks his RLE was elevated. After several weeks of that he developed an area of pain and swelling just above his right ankle and was seen elsewhere and had an ultrasound done that showed superficial thrombophlebitis. Most of the symptoms have resolved but there is an area that he remains concerned about. He is wearing a support stocking and wants to be referred to a vascular surgeon.  Outpatient Medications Prior to Visit  Medication Sig Dispense Refill  . acetaminophen (TYLENOL) 500 MG tablet Take 500 mg by mouth every 6 (six) hours as needed for mild pain.    [name] CELEBREX 200 MG capsule Take 200 mg by mouth daily.  3  . Cholecalciferol (VITAMIN D PO) Take 1 tablet by mouth daily.    . famotidine (PEPCID) 20 MG tablet Take 1 tablet (20 mg total) by mouth 2 (two) times daily. 10 tablet 0  . fluticasone (FLONASE) 50 MCG/ACT nasal spray Place 1 spray into both nostrils daily as needed for allergies.     . Magnesium 250 MG TABS Take 250 mg by mouth daily.    . Multiple Vitamin (MULTI VITAMIN DAILY PO) Take 1 tablet by mouth daily.     [name] omeprazole (PRILOSEC) 20 MG capsule Take 20 mg by mouth 2 (two) times daily as needed (acid reflux). Takes 1 daily and may take a second dose if needed    . Probiotic Product (PROBIOTIC DAILY) CAPS Take 1 capsule by mouth daily.    . promethazine (PHENERGAN) 25 MG tablet Take 12.5 mg by mouth every 6 (six) hours as needed for nausea or vomiting.    . ondansetron (ZOFRAN) 4 MG tablet Take 2-4 mg by mouth at bedtime as needed for nausea.   6   No facility-administered medications prior to visit.     ROS Review of Systems  Constitutional: Negative for fatigue.  HENT: Negative.     Eyes: Negative.   Respiratory: Negative for cough, chest tightness, shortness of breath and wheezing.   Cardiovascular: Positive for leg swelling. Negative for chest pain and palpitations.  Gastrointestinal: Negative for abdominal pain, constipation, diarrhea, nausea and vomiting.  Endocrine: Negative.   Genitourinary: Negative.  Negative for difficulty urinating.  Musculoskeletal: Negative for back pain and myalgias.  Skin: Positive for color change.  Allergic/Immunologic: Negative.   Neurological: Negative.  Negative for dizziness, weakness and numbness.  Hematological: Negative for adenopathy. Does not bruise/bleed easily.  Psychiatric/Behavioral: Negative.     Objective:  BP 126/80   Pulse 67   Temp 98.1 F (36.7 C)   Resp 16   Wt 224 lb (101.6 kg)   SpO2 99%   BMI 27.27 kg/m   BP Readings from Last 3 Encounters:  01/31/17 126/80  12/18/16 (!) 149/88  09/08/16 138/72    Wt Readings from Last 3 Encounters:  01/31/17 224 lb (101.6 kg)  12/18/16 220 lb (99.8 kg)  09/08/16 223 lb (101.2 kg)    Physical Exam  Constitutional: He is oriented to person, place, and time. No distress.  HENT:  Mouth/Throat: Oropharynx is clear and moist. No oropharyngeal exudate.  Eyes: Conjunctivae are normal. Right eye exhibits no discharge. Left eye exhibits no discharge.  No scleral icterus.  Neck: Normal range of motion. Neck supple. No JVD present. No thyromegaly present.  Cardiovascular: Normal rate, regular rhythm and intact distal pulses.  Exam reveals no gallop and no friction rub.   No murmur heard. Pulses:      Carotid pulses are 1+ on the right side, and 1+ on the left side.      Radial pulses are 1+ on the right side, and 1+ on the left side.       Femoral pulses are 1+ on the right side, and 1+ on the left side.      Popliteal pulses are 1+ on the right side, and 1+ on the left side.       Dorsalis pedis pulses are 1+ on the right side, and 1+ on the left side.        Posterior tibial pulses are 1+ on the right side, and 1+ on the left side.  Pulmonary/Chest: Effort normal and breath sounds normal. No respiratory distress. He has no wheezes. He has no rales. He exhibits no tenderness.  Abdominal: Soft. Bowel sounds are normal. He exhibits no distension and no mass. There is no tenderness. There is no rebound and no guarding.  Musculoskeletal: Normal range of motion. He exhibits edema. He exhibits no tenderness.       Right lower leg: He exhibits swelling and deformity. He exhibits no tenderness, no bony tenderness, no edema and no laceration.       Legs: Lymphadenopathy:    He has no cervical adenopathy.  Neurological: He is alert and oriented to person, place, and time.  Skin: Skin is warm. He is not diaphoretic. There is erythema.    Lab Results  Component Value Date   WBC 8.4 12/18/2016   HGB 13.1 12/18/2016   HCT 39.2 12/18/2016   PLT 193 12/18/2016   GLUCOSE 101 (H) 12/18/2016   CHOL 159 03/14/2014   TRIG 65.0 03/14/2014   HDL 32.00 (L) 03/14/2014   LDLCALC 114 (H) 03/14/2014   ALT 22 08/31/2016   AST 26 08/31/2016   NA 139 12/18/2016   K 4.3 12/18/2016   CL 105 12/18/2016   CREATININE 0.99 12/18/2016   BUN 19 12/18/2016   CO2 26 12/18/2016   TSH 1.42 03/14/2014   PSA 12.47 (H) 04/07/2009   INR 1.06 08/31/2016    No results found.  Assessment & Plan:   Christian Ward was seen today for leg swelling.  Diagnoses and all orders for this visit:  Need for prophylactic vaccination against Streptococcus pneumoniae (pneumococcus) -     Pneumococcal conjugate vaccine 13-valent  Venous stasis dermatitis of right lower extremity- I think starting vasculera would help with his symptoms and prevent further complications. At his request he is referred to vascular surgery. -     Dietary Management Product (VASCULERA) TABS; Take 1 capsule by mouth daily. -     Ambulatory referral to Vascular Surgery   I have discontinued [name]'s ondansetron.  I am also having him start on VASCULERA. Additionally, I am having him maintain his omeprazole, PROBIOTIC DAILY, fluticasone, Multiple Vitamin (MULTI VITAMIN DAILY PO), Magnesium, Cholecalciferol (VITAMIN D PO), acetaminophen, CELEBREX, promethazine, famotidine, and vitamin C.  Meds ordered this encounter  Medications  . vitamin C (ASCORBIC ACID) 500 MG tablet    Sig: Take 500 mg by mouth daily.  . Dietary Management Product (VASCULERA) TABS    Sig: Take 1 capsule by mouth daily.    Dispense:  30 tablet  Refill:  11     Follow-up: Return in about 3 months (around 05/02/2017).  Scarlette Calico, MD

## 2017-01-31 NOTE — Patient Instructions (Signed)

## 2017-02-02 DIAGNOSIS — M545 Low back pain: Secondary | ICD-10-CM | POA: Diagnosis not present

## 2017-02-02 DIAGNOSIS — M79604 Pain in right leg: Secondary | ICD-10-CM | POA: Diagnosis not present

## 2017-02-07 ENCOUNTER — Other Ambulatory Visit: Payer: Self-pay

## 2017-02-07 DIAGNOSIS — M79604 Pain in right leg: Secondary | ICD-10-CM | POA: Diagnosis not present

## 2017-02-07 DIAGNOSIS — I872 Venous insufficiency (chronic) (peripheral): Secondary | ICD-10-CM

## 2017-02-07 DIAGNOSIS — M545 Low back pain: Secondary | ICD-10-CM | POA: Diagnosis not present

## 2017-02-09 DIAGNOSIS — M79604 Pain in right leg: Secondary | ICD-10-CM | POA: Diagnosis not present

## 2017-02-09 DIAGNOSIS — M545 Low back pain: Secondary | ICD-10-CM | POA: Diagnosis not present

## 2017-02-13 DIAGNOSIS — M461 Sacroiliitis, not elsewhere classified: Secondary | ICD-10-CM | POA: Diagnosis not present

## 2017-02-15 DIAGNOSIS — M545 Low back pain: Secondary | ICD-10-CM | POA: Diagnosis not present

## 2017-02-15 DIAGNOSIS — M79604 Pain in right leg: Secondary | ICD-10-CM | POA: Diagnosis not present

## 2017-02-23 DIAGNOSIS — M79604 Pain in right leg: Secondary | ICD-10-CM | POA: Diagnosis not present

## 2017-02-23 DIAGNOSIS — M545 Low back pain: Secondary | ICD-10-CM | POA: Diagnosis not present

## 2017-02-28 DIAGNOSIS — M79604 Pain in right leg: Secondary | ICD-10-CM | POA: Diagnosis not present

## 2017-02-28 DIAGNOSIS — M545 Low back pain: Secondary | ICD-10-CM | POA: Diagnosis not present

## 2017-03-06 ENCOUNTER — Ambulatory Visit (INDEPENDENT_AMBULATORY_CARE_PROVIDER_SITE_OTHER): Payer: Medicare Other | Admitting: Surgery

## 2017-03-06 ENCOUNTER — Ambulatory Visit (HOSPITAL_COMMUNITY)
Admission: RE | Admit: 2017-03-06 | Discharge: 2017-03-06 | Disposition: A | Discharge status: Home / Self Care | Payer: Medicare Other | Source: Ambulatory Visit | Attending: Surgery | Admitting: Surgery

## 2017-03-06 ENCOUNTER — Encounter: Payer: Self-pay | Admitting: Surgery

## 2017-03-06 VITALS — BP 168/98 | HR 74 | Temp 97.4°F | Resp 18 | Ht 76.0 in | Wt 220.0 lb

## 2017-03-06 DIAGNOSIS — I872 Venous insufficiency (chronic) (peripheral): Secondary | ICD-10-CM

## 2017-03-06 DIAGNOSIS — M7989 Other specified soft tissue disorders: Secondary | ICD-10-CM | POA: Insufficient documentation

## 2017-03-06 NOTE — Progress Notes (Signed)
[facility] [place]  Patient name: Christian Ward Christian Ward: [id number] DOB: 1950-07-10 Sex: male   REQUESTING PROVIDER:    [name]   REASON FOR CONSULT:    Venous stasis dermatitis  HISTORY OF PRESENT ILLNESS:   Christian Ward is a 66 y.o. male, who is Here today for evaluation of chronic venous insufficiency.  The patient states that he began having problems with his right leg following a sacroiliac joint fusion on 09/08/2016.  He did not have any trouble in his leg prior to his operation.  He was immobile for 5 weeks with the exception of using crutches.  In June the swelling became to a point where he went for a duplex ultrasound which was negative for DVT but did show a small superficial thrombophlebitis.  He noticed that in July the swelling was started to go down.  He has also been working with physical therapy to help get the swelling to go down.  He has been wearing compression stockings, 20-30, knee high.  He was initially wearing these every day but is now down to wearing them 5 days a week.  He states that hot water makes his legs worse.  He is concerned about the prominent veins up on the foot that are now apparent.  PAST MEDICAL HISTORY    Past Medical History:  Diagnosis Date  . Acid reflux   . Cancer ([place])   . 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)   . Complication of anesthesia    trouble waking up  . Fibromyalgia   . H/O infectious mononucleosis   . History of shingles   . Hypercholesterolemia   . IBS (irritable bowel syndrome)   . 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 S/P radiation therapy   . Shoulder pain, left      FAMILY HISTORY   Family History  Problem Relation Age of Onset  . Hypertension Father   . Cancer Father        prostate  . Stroke Father   . COPD Father   . Coronary artery disease Father   . Lung disease Father   . Heart disease Father   . Diabetes Father   .  Lung disease Mother   . Diabetes Sister   . Cancer Brother        surgery    SOCIAL HISTORY:   Social History   Social History  . Marital status: Married    Spouse name: N/A  . Number of children: N/A  . Years of education: N/A   Occupational History  . Not on file.   Social History Main Topics  . Smoking status: Former Smoker    Packs/day: 0.50    Years: 10.00    Types: Cigarettes    Start date: 05/09/1980    Quit date: 03/16/1986  . Smokeless tobacco: Never Used  . Alcohol use 0.0 oz/week     Comment: weekend  . Drug use: No  . Sexual activity: Not Currently   Other Topics Concern  . Not on file   Social History Narrative  . No narrative on file    ALLERGIES:    Allergies  Allergen Reactions  . Tramadol Nausea And Vomiting  . Oxycodone Nausea Only    CURRENT MEDICATIONS:    Current Outpatient Prescriptions  Medication Sig Dispense Refill  . acetaminophen (TYLENOL) 500 MG tablet Take 500 mg by mouth every 6 (six) hours as needed for mild pain.    [name]  CELEBREX 200 MG capsule Take 200 mg by mouth daily.  3  . Cholecalciferol (VITAMIN D PO) Take 1 tablet by mouth daily.    . Dietary Management Product (VASCULERA) TABS Take 1 capsule by mouth daily. 30 tablet 11  . famotidine (PEPCID) 20 MG tablet Take 1 tablet (20 mg total) by mouth 2 (two) times daily. 10 tablet 0  . fluticasone (FLONASE) 50 MCG/ACT nasal spray Place 1 spray into both nostrils daily as needed for allergies.     . Magnesium 250 MG TABS Take 250 mg by mouth daily.    . Multiple Vitamin (MULTI VITAMIN DAILY PO) Take 1 tablet by mouth daily.     [name] omeprazole (PRILOSEC) 20 MG capsule Take 20 mg by mouth 2 (two) times daily as needed (acid reflux). Takes 1 daily and may take a second dose if needed    . ondansetron (ZOFRAN) 4 MG tablet TAKE 1 TABLET BY MOUTH EVERY 6 HOURS AS NEEDED FOR NAUSEA AND VOMITING  1  . Probiotic Product (PROBIOTIC DAILY) CAPS Take 1 capsule by mouth daily.    .  promethazine (PHENERGAN) 25 MG tablet Take 12.5 mg by mouth every 6 (six) hours as needed for nausea or vomiting.    . vitamin C (ASCORBIC ACID) 500 MG tablet Take 500 mg by mouth daily.     No current facility-administered medications for this visit.     REVIEW OF SYSTEMS:   [X]  denotes positive finding, [ ]  denotes negative finding Cardiac  Comments:  Chest pain or chest pressure:    Shortness of breath upon exertion:    Short of breath when lying flat:    Irregular heart rhythm:        Vascular    Pain in calf, thigh, or hip brought on by ambulation:    Pain in feet at night that wakes you up from your sleep:     Blood clot in your veins:    Leg swelling:  x       Pulmonary    Oxygen at home:    Productive cough:     Wheezing:         Neurologic    Sudden weakness in arms or legs:     Sudden numbness in arms or legs:     Sudden onset of difficulty speaking or slurred speech:    Temporary loss of vision in one eye:     Problems with dizziness:         Gastrointestinal    Blood in stool:      Vomited blood:         Genitourinary    Burning when urinating:     Blood in urine:        Psychiatric    Major depression:         Hematologic    Bleeding problems:    Problems with blood clotting too easily:        Skin    Rashes or ulcers:        Constitutional    Fever or chills:     PHYSICAL EXAM:   Vitals:   03/06/17 1428 03/06/17 1432  BP: (!) 158/90 (!) 168/98  Pulse: 74 74  Resp: 18   Temp: (!) 97.4 F (36.3 C)   SpO2: 100%   Weight: 220 lb (99.8 kg)   Height: 6\' 4"  (1.93 m)     GENERAL: The patient is a well-nourished male, in no acute distress. The vital  signs are documented above. CARDIAC: There is a regular rate and rhythm.  VASCULAR: Palpable pedal pulses.  2+ pitting edema to the right leg, trace edema on the left leg.  Early hyperpigmentation around the left medial ankle. PULMONARY: Nonlabored respirations MUSCULOSKELETAL: There are no  major deformities or cyanosis. NEUROLOGIC: No focal weakness or paresthesias are detected. SKIN: There are no ulcers or rashes noted. PSYCHIATRIC: The patient has a normal affect.  STUDIES:   I have ordered and reviewed his venous reflux study.  There is no evidence of DVT.  He does have superficial venous insufficiency in the great saphenous vein.  Vein diameters are 0.8.  The saphenofemoral junction.  There is reflux in the common femoral vein.  ASSESSMENT and PLAN   Right leg edema: It is unclear at this time whether or not the patient's swelling is secondary to lymphatic drainage issues following his surgery, whether this is the result of chronic venous insufficiency.  I would expect that if this were lymphatic, it would resolve within the next several months.  Therefore, I would not recommend any intervention at this time other than continuing with leg elevation and compression therapy.  I have encouraged him to wear thigh-high 20-30 compression stockings.  He is going to follow-up in 3 months if the swelling has not improved.  At that time, we could consider laser ablation of the great saphenous vein.   Annamarie Major, MD [facility] [phone] Pager [phone]

## 2017-03-13 ENCOUNTER — Encounter: Payer: Self-pay | Admitting: Internal Medicine

## 2017-03-13 ENCOUNTER — Ambulatory Visit (INDEPENDENT_AMBULATORY_CARE_PROVIDER_SITE_OTHER): Payer: Medicare Other | Admitting: Internal Medicine

## 2017-03-13 VITALS — BP 150/94 | HR 69 | Temp 98.7°F | Resp 16 | Ht 76.0 in | Wt 223.0 lb

## 2017-03-13 DIAGNOSIS — F418 Other specified anxiety disorders: Secondary | ICD-10-CM | POA: Diagnosis not present

## 2017-03-13 DIAGNOSIS — R9721 Rising PSA following treatment for malignant neoplasm of prostate: Secondary | ICD-10-CM | POA: Diagnosis not present

## 2017-03-13 DIAGNOSIS — R972 Elevated prostate specific antigen [PSA]: Secondary | ICD-10-CM | POA: Diagnosis not present

## 2017-03-13 DIAGNOSIS — K5900 Constipation, unspecified: Secondary | ICD-10-CM

## 2017-03-13 DIAGNOSIS — C61 Malignant neoplasm of prostate: Secondary | ICD-10-CM | POA: Diagnosis not present

## 2017-03-13 DIAGNOSIS — I10 Essential (primary) hypertension: Secondary | ICD-10-CM

## 2017-03-13 MED ORDER — IRBESARTAN 150 MG PO TABS: 150.0000 mg | ORAL_TABLET | Freq: Every day | ORAL | 1 refills | Status: DC

## 2017-03-13 MED ORDER — VILAZODONE HCL 10 & 20 MG PO KIT: 1.0000 | PACK | Freq: Every day | ORAL | 0 refills | Status: DC

## 2017-03-13 NOTE — Progress Notes (Signed)
Subjective:  Patient ID: Christian Ward, male    DOB: 1950-08-27  Age: 66 y.o. MRN: [id number]  CC: Hypertension and Depression   HPI Christian Ward presents for a BP check - his BP has been elevated recently, it was 168/98 a week ago during a vascular sx appt and has been up to 186/90 at home. He has a few HA's and complains of nausea and dizziness.   He also complains or worsening depression with insomnia, anhedonia, increased appetite, wt gain, and crying spells. He tried trintellix a few months ago but tells me that it made him feel jittery.  Outpatient Medications Prior to Visit  Medication Sig Dispense Refill  . acetaminophen (TYLENOL) 500 MG tablet Take 500 mg by mouth every 6 (six) hours as needed for mild pain.    [name] CELEBREX 200 MG capsule Take 200 mg by mouth daily.  3  . Cholecalciferol (VITAMIN D PO) Take 1 tablet by mouth daily.    . Dietary Management Product (VASCULERA) TABS Take 1 capsule by mouth daily. 30 tablet 11  . famotidine (PEPCID) 20 MG tablet Take 1 tablet (20 mg total) by mouth 2 (two) times daily. 10 tablet 0  . fluticasone (FLONASE) 50 MCG/ACT nasal spray Place 1 spray into both nostrils daily as needed for allergies.     . Magnesium 250 MG TABS Take 250 mg by mouth daily.    . Multiple Vitamin (MULTI VITAMIN DAILY PO) Take 1 tablet by mouth daily.     [name] omeprazole (PRILOSEC) 20 MG capsule Take 20 mg by mouth 2 (two) times daily as needed (acid reflux). Takes 1 daily and may take a second dose if needed    . ondansetron (ZOFRAN) 4 MG tablet TAKE 1 TABLET BY MOUTH EVERY 6 HOURS AS NEEDED FOR NAUSEA AND VOMITING  1  . Probiotic Product (PROBIOTIC DAILY) CAPS Take 1 capsule by mouth daily.    . vitamin C (ASCORBIC ACID) 500 MG tablet Take 500 mg by mouth daily.    . promethazine (PHENERGAN) 25 MG tablet Take 12.5 mg by mouth every 6 (six) hours as needed for nausea or vomiting.     No facility-administered medications prior to visit.     ROS Review of  Systems  Constitutional: Positive for appetite change. Negative for diaphoresis, fatigue and unexpected weight change.  HENT: Negative.  Negative for sinus pressure and trouble swallowing.   Eyes: Negative.  Negative for photophobia and visual disturbance.  Respiratory: Negative.  Negative for cough, chest tightness, shortness of breath and wheezing.   Cardiovascular: Negative.  Negative for chest pain and leg swelling.  Gastrointestinal: Positive for constipation and nausea. Negative for abdominal pain, diarrhea, rectal pain and vomiting.  Endocrine: Negative.   Genitourinary: Negative.  Negative for difficulty urinating and dysuria.  Musculoskeletal: Negative.   Skin: Negative.   Neurological: Positive for dizziness, light-headedness and headaches. Negative for tremors, seizures, syncope, facial asymmetry, speech difficulty, weakness and numbness.  Hematological: Negative for adenopathy. Does not bruise/bleed easily.  Psychiatric/Behavioral: Positive for dysphoric mood and sleep disturbance. Negative for agitation, behavioral problems, confusion, decreased concentration, hallucinations, self-injury and suicidal ideas. The patient is nervous/anxious. The patient is not hyperactive.     Objective:  BP (!) 150/94 (BP Location: Left Arm, Patient Position: Sitting, Cuff Size: Normal)   Pulse 69   Temp 98.7 F (37.1 C) (Oral)   Resp 16   Ht '6\' 4"'$  (1.93 m)   Wt 223 lb (101.2 kg)  SpO2 98%   BMI 27.14 kg/m   BP Readings from Last 3 Encounters:  03/13/17 (!) 150/94  03/06/17 (!) 168/98  01/31/17 126/80    Wt Readings from Last 3 Encounters:  03/13/17 223 lb (101.2 kg)  03/06/17 220 lb (99.8 kg)  01/31/17 224 lb (101.6 kg)    Physical Exam  Constitutional: He is oriented to person, place, and time. He appears well-nourished. No distress.  HENT:  Head: Normocephalic and atraumatic.  Mouth/Throat: Oropharynx is clear and moist. No oropharyngeal exudate.  Eyes: Conjunctivae and EOM  are normal. Pupils are equal, round, and reactive to light. Right eye exhibits no discharge. Left eye exhibits no discharge. No scleral icterus.  Neck: Normal range of motion. Neck supple. No JVD present. No thyromegaly present.  Cardiovascular: Normal rate, regular rhythm and intact distal pulses. Exam reveals no gallop and no friction rub.  No murmur heard. Pulmonary/Chest: Effort normal and breath sounds normal. No respiratory distress. He has no wheezes. He has no rales. He exhibits no tenderness.  Abdominal: Soft. Bowel sounds are normal. He exhibits no distension and no mass. There is no tenderness. There is no rebound and no guarding.  Musculoskeletal: Normal range of motion. He exhibits no edema, tenderness or deformity.  Lymphadenopathy:    He has no cervical adenopathy.  Neurological: He is alert and oriented to person, place, and time. He has normal reflexes. He displays normal reflexes. No cranial nerve deficit. He exhibits normal muscle tone. Coordination normal.  Skin: Skin is warm and dry. No rash noted. He is not diaphoretic. No erythema. No pallor.  Psychiatric: Judgment and thought content normal. His mood appears anxious. His affect is not angry, not blunt, not labile and not inappropriate. His speech is not rapid and/or pressured, not delayed and not tangential. He is slowed. He is not aggressive and not withdrawn. Cognition and memory are normal. He exhibits a depressed mood. He expresses no homicidal and no suicidal ideation. He expresses no suicidal plans and no homicidal plans.  Sad, crying He is attentive.  Vitals reviewed.   Lab Results  Component Value Date   WBC 8.4 12/18/2016   HGB 13.1 12/18/2016   HCT 39.2 12/18/2016   PLT 193 12/18/2016   GLUCOSE 101 (H) 12/18/2016   CHOL 159 03/14/2014   TRIG 65.0 03/14/2014   HDL 32.00 (L) 03/14/2014   LDLCALC 114 (H) 03/14/2014   ALT 22 08/31/2016   AST 26 08/31/2016   NA 139 12/18/2016   K 4.3 12/18/2016   CL 105  12/18/2016   CREATININE 0.99 12/18/2016   BUN 19 12/18/2016   CO2 26 12/18/2016   TSH 1.42 03/14/2014   PSA 12.47 (H) 04/07/2009   INR 1.06 08/31/2016    No results found.  Assessment & Plan:   Eldin was seen today for hypertension and depression.  Diagnoses and all orders for this visit:  Essential hypertension- his blood pressure is not adequately well controlled.  His recent electrolytes and renal function were normal.  We will start an ARB. -     irbesartan (AVAPRO) 150 MG tablet; Take 1 tablet (150 mg total) daily by mouth.  Depression with anxiety- I have asked him to try Viibryd.  Will start at a low dose and gradually increase the dose over the next few weeks. -     Vilazodone HCl (VIIBRYD STARTER PACK) 10 & 20 MG KIT; Take 1 tablet daily by mouth.  Constipation, unspecified constipation type- his recent labs, including a  calcium level, indicate no secondary or metabolic causes for constipation.  I will screen for hypothyroidism today.  He is getting adequate symptom relief with MiraLAX. -     Thyroid Panel With TSH; Future   I have discontinued Finn A. Chamblin's promethazine. I am also having him start on Vilazodone HCl and irbesartan. Additionally, I am having him maintain his omeprazole, PROBIOTIC DAILY, fluticasone, Multiple Vitamin (MULTI VITAMIN DAILY PO), Magnesium, Cholecalciferol (VITAMIN D PO), acetaminophen, CELEBREX, famotidine, vitamin C, VASCULERA, and ondansetron.  Meds ordered this encounter  Medications  . Vilazodone HCl (VIIBRYD STARTER PACK) 10 & 20 MG KIT    Sig: Take 1 tablet daily by mouth.    Dispense:  1 kit    Refill:  0  . irbesartan (AVAPRO) 150 MG tablet    Sig: Take 1 tablet (150 mg total) daily by mouth.    Dispense:  90 tablet    Refill:  1     Follow-up: Return in about 6 weeks (around 04/24/2017).  Scarlette Calico, MD

## 2017-03-13 NOTE — Patient Instructions (Signed)

## 2017-03-14 ENCOUNTER — Ambulatory Visit: Payer: Medicare Other | Admitting: Internal Medicine

## 2017-03-15 DIAGNOSIS — M79604 Pain in right leg: Secondary | ICD-10-CM | POA: Diagnosis not present

## 2017-03-15 DIAGNOSIS — M545 Low back pain: Secondary | ICD-10-CM | POA: Diagnosis not present

## 2017-03-21 IMAGING — DX DG ABDOMEN ACUTE W/ 1V CHEST
4 series · 4 of 4 positions shown · non-contrast
Comparison: Chest x-ray 03/17/2014.

CLINICAL DATA: Six day history of cough, congestion, nausea and
constipation.

EXAM:
DG ABDOMEN ACUTE W/ 1V CHEST

[chest pa (1 of 2)]
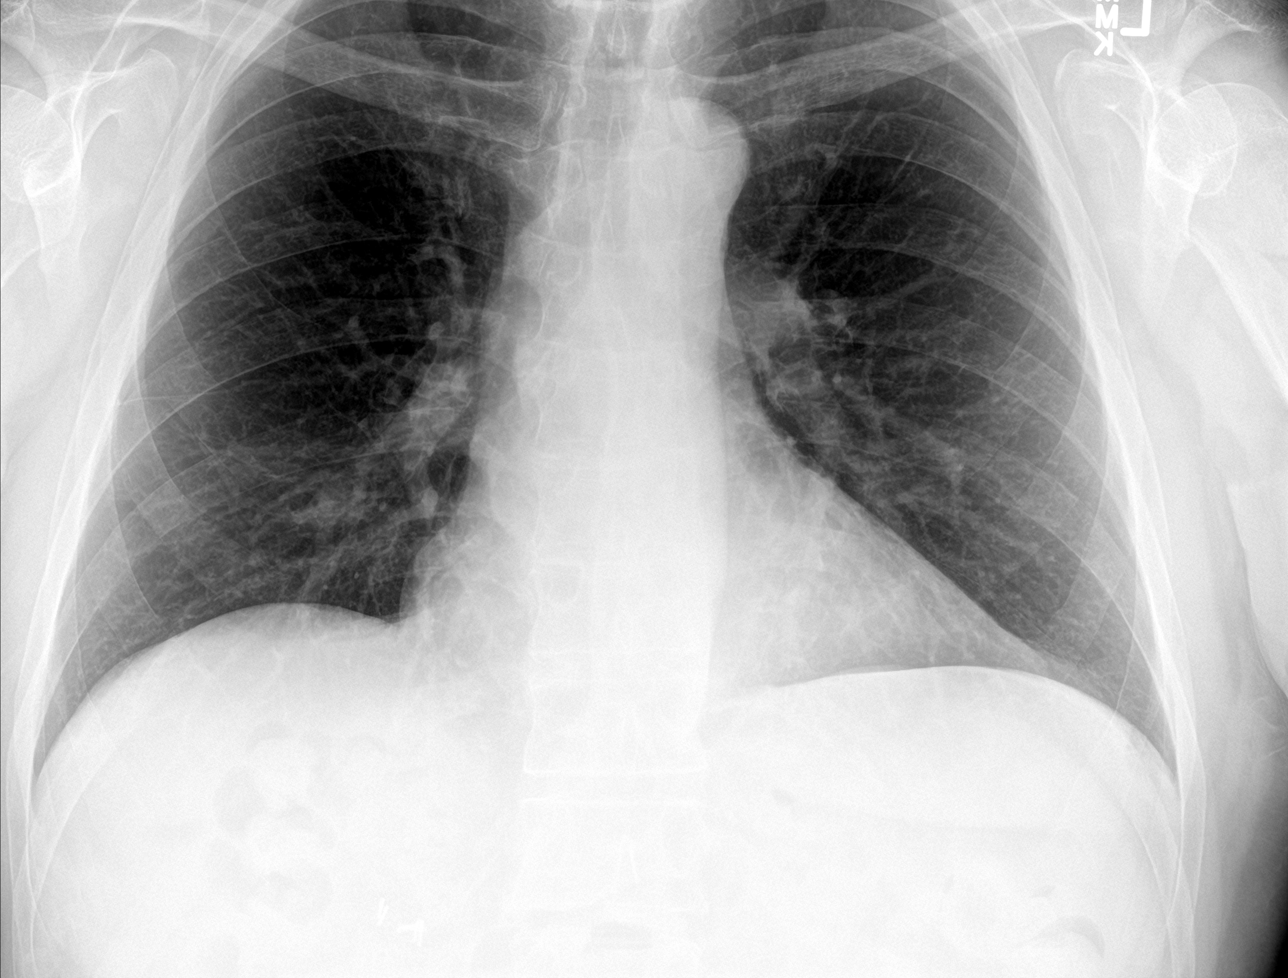

[abdomen erect]
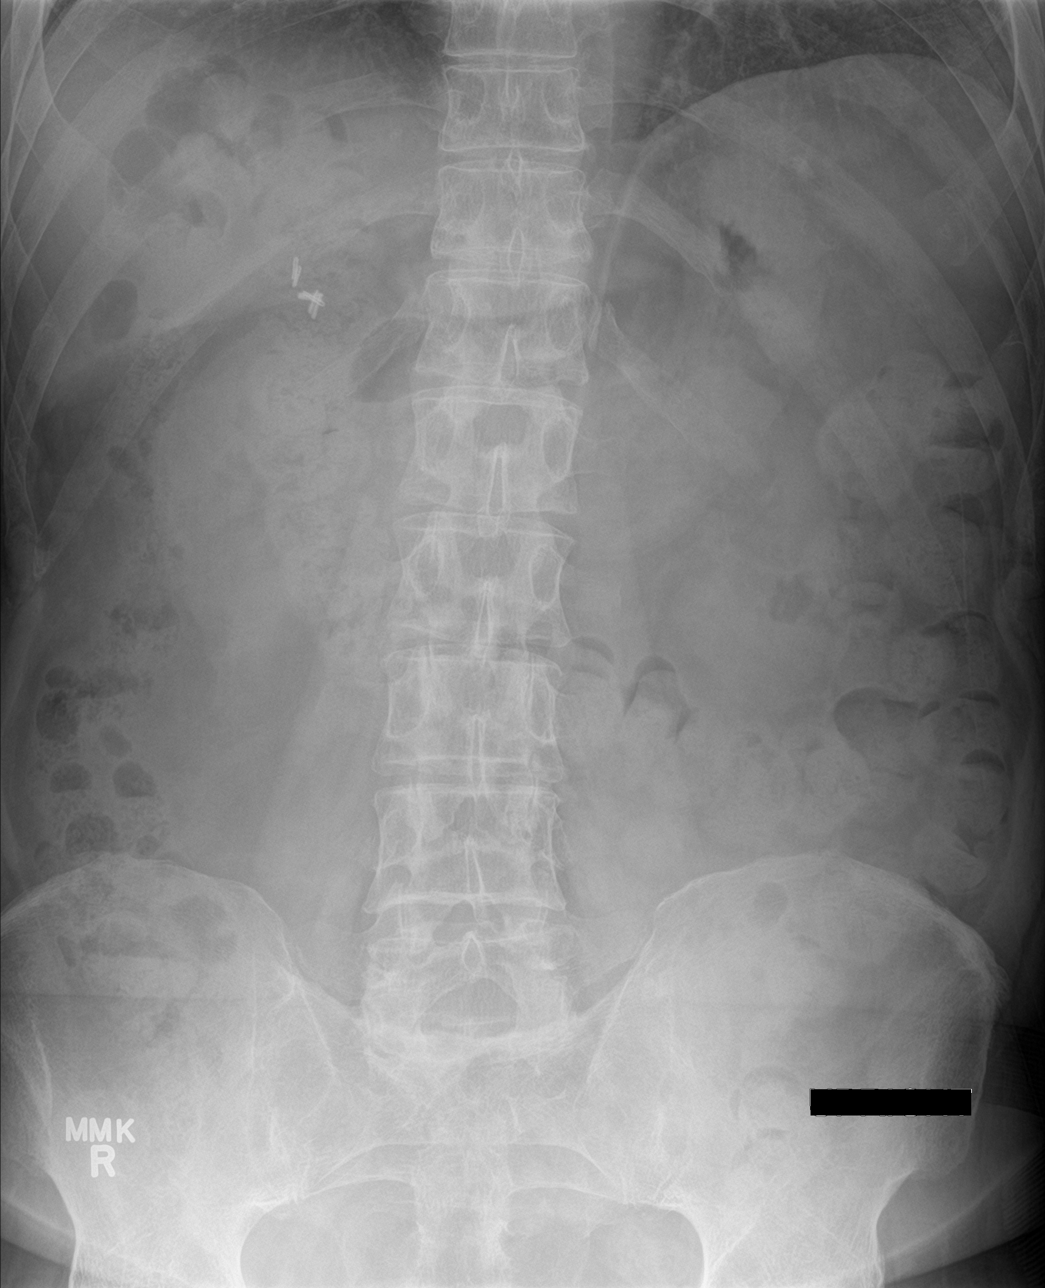

[abdomen supine]
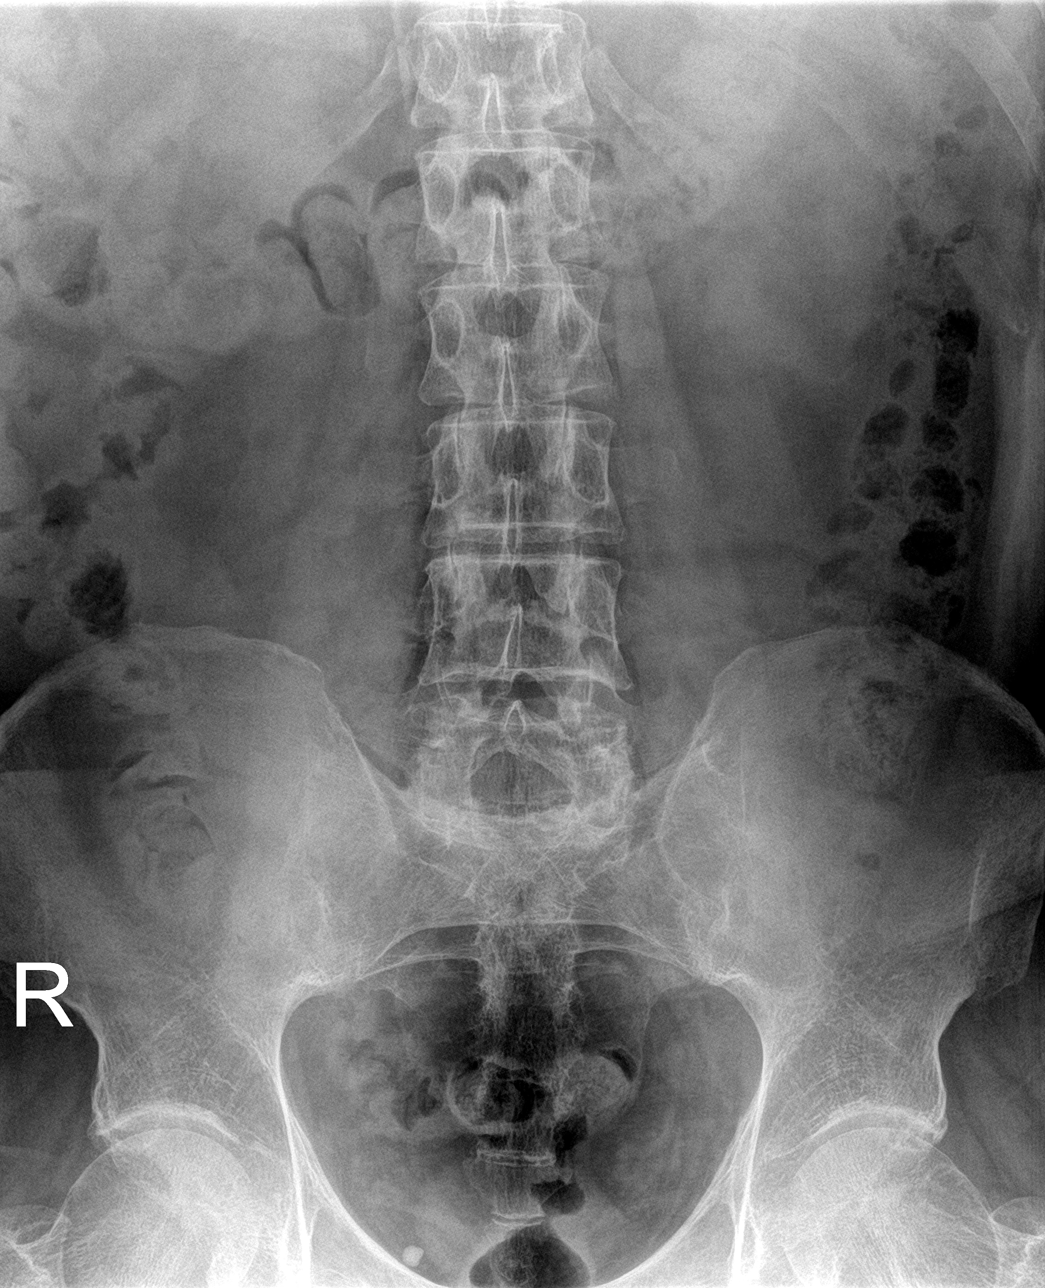

[chest pa (2 of 2)]
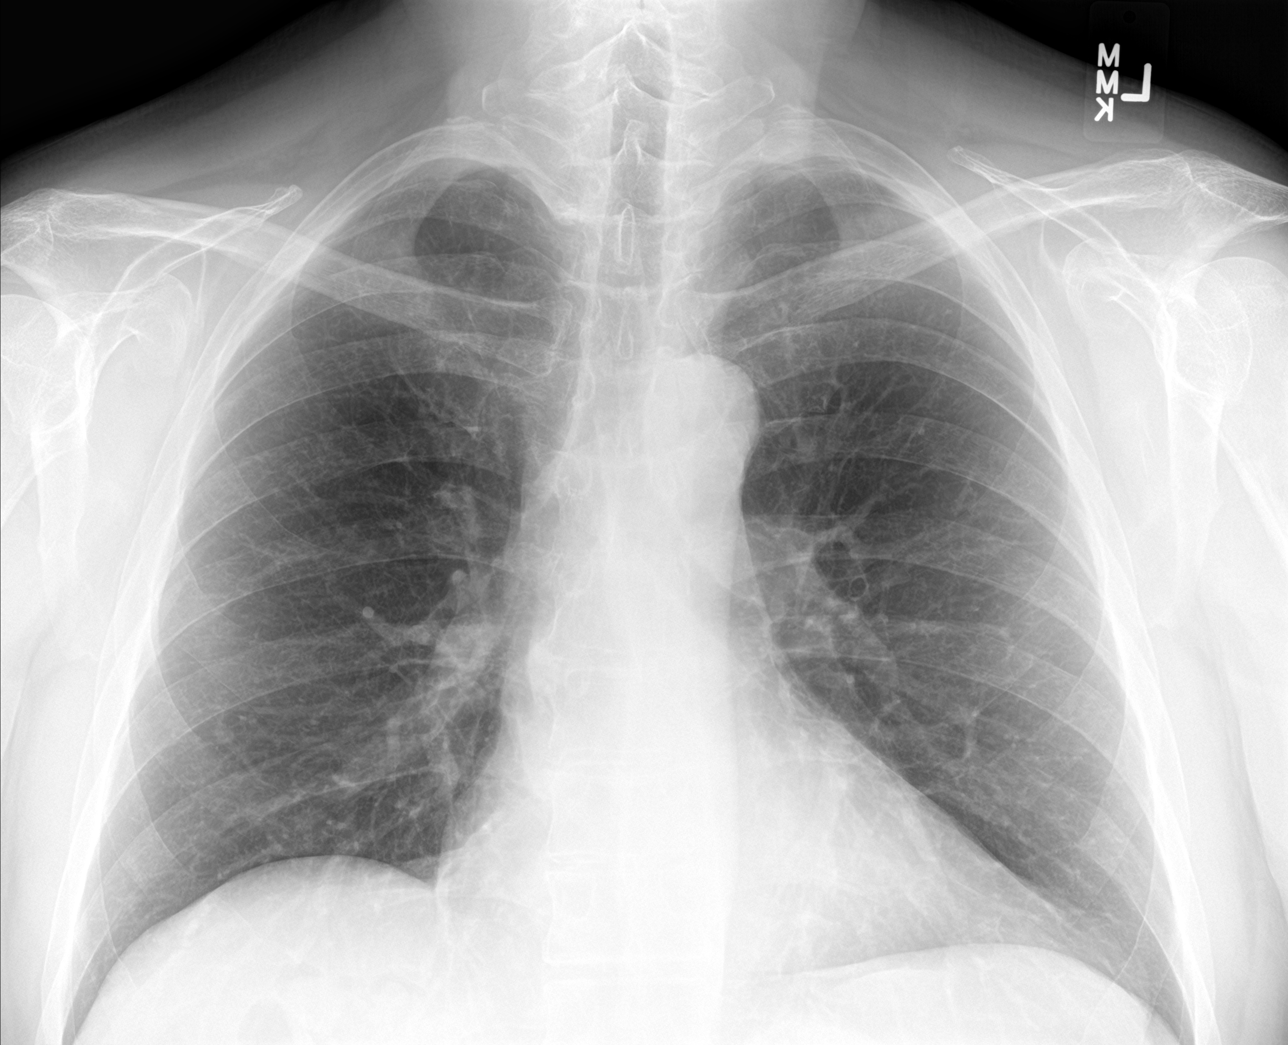

[4 of 4 positions shown; findings below may reference images not displayed]

FINDINGS: The upright chest x-ray is normal and stable.

Two views of the abdomen demonstrate large amount of stool
throughout the colon suggesting constipation. No small bowel
obstruction or free air. The soft tissue shadows are maintained. The
bony structures are intact.
IMPRESSION: Normal chest x-ray.

Large amount of stool throughout the colon suggesting constipation.
No findings for small bowel obstruction or free air.

## 2017-03-27 ENCOUNTER — Telehealth: Payer: Self-pay | Admitting: Internal Medicine

## 2017-03-27 ENCOUNTER — Other Ambulatory Visit: Payer: Self-pay

## 2017-03-27 NOTE — Telephone Encounter (Signed)
Pt called and stated that his BP is still spiking.  Patient has been taking the medication (Viibryd) and is having stomach discomfort. Symptoms have gotten a little better. Patient started the 20 mg dose and is having dizziness and nausea that has not gotten better.   Pt feels like he does need something but that Viibryd may not be the best fit.  Pt is requesting to change medication.

## 2017-03-27 NOTE — Telephone Encounter (Signed)
Pt was told to call and speak with Dr Ronnald Ramp about a new medication that he was given. He can be reached at [phone].

## 2017-04-02 ENCOUNTER — Other Ambulatory Visit: Payer: Self-pay | Admitting: Internal Medicine

## 2017-04-02 DIAGNOSIS — F418 Other specified anxiety disorders: Secondary | ICD-10-CM

## 2017-04-02 MED ORDER — SERTRALINE HCL 50 MG PO TABS: 50.0000 mg | ORAL_TABLET | Freq: Every day | ORAL | 3 refills | Status: DC

## 2017-04-02 NOTE — Telephone Encounter (Signed)
Will change to sertraline Low dose initially Will need to stay in touch to increase the dose over time

## 2017-04-03 DIAGNOSIS — K219 Gastro-esophageal reflux disease without esophagitis: Secondary | ICD-10-CM | POA: Diagnosis not present

## 2017-04-03 NOTE — Telephone Encounter (Signed)
Pt informed new med was sent in.

## 2017-04-11 DIAGNOSIS — M545 Low back pain: Secondary | ICD-10-CM | POA: Diagnosis not present

## 2017-04-11 DIAGNOSIS — M79604 Pain in right leg: Secondary | ICD-10-CM | POA: Diagnosis not present

## 2017-05-15 DIAGNOSIS — M461 Sacroiliitis, not elsewhere classified: Secondary | ICD-10-CM | POA: Diagnosis not present

## 2017-06-13 ENCOUNTER — Encounter: Payer: Self-pay | Admitting: Vascular Surgery

## 2017-06-13 ENCOUNTER — Ambulatory Visit (INDEPENDENT_AMBULATORY_CARE_PROVIDER_SITE_OTHER): Payer: BC Managed Care – PPO | Admitting: Vascular Surgery

## 2017-06-13 VITALS — BP 138/80 | HR 66 | Temp 98.0°F | Resp 16 | Ht 76.0 in | Wt 225.0 lb

## 2017-06-13 DIAGNOSIS — C61 Malignant neoplasm of prostate: Secondary | ICD-10-CM | POA: Diagnosis not present

## 2017-06-13 DIAGNOSIS — R972 Elevated prostate specific antigen [PSA]: Secondary | ICD-10-CM | POA: Diagnosis not present

## 2017-06-13 DIAGNOSIS — I872 Venous insufficiency (chronic) (peripheral): Secondary | ICD-10-CM

## 2017-06-13 NOTE — Progress Notes (Signed)
V[facility] and [facility] Specialist of [place]  Patient name: Christian Ward MRN: [id number] DOB: 08-Jan-1951 Sex: male  REASON FOR VISIT: Here today for follow-up of right leg swelling and venous hypertension  HPI: Christian Ward is a 67 y.o. male here today for follow-up.  Had seen Dr. Trula Slade in October for right leg swelling.  Has somewhat an unusual history.  Reports that after he underwent sacroiliac joint fusion and may need immediately began noticing swelling in his hip.  Had a venous duplex at that time which showed some superficial calf vein thrombus but no evidence of DVT.  He has persisted in having swelling.  He is quite compliant with his compression but notes immediately after taking his talking is off that he notes swelling in his calf and ankle and notes that also in the shower this is made worse.  He does have significant skin changes on the dorsum of his foot with small telangiectasia extending out onto the dorsum of his foot and also hemosiderin deposit around his ankle.  He due to reports that this is all new since his surgery in May 2018.  Past Medical History:  Diagnosis Date  . Acid reflux   . Cancer ([place])   . 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 Complication of anesthesia    trouble waking up  . Fibromyalgia   . H/O infectious mononucleosis   . History of shingles   . Hypercholesterolemia   . IBS (irritable bowel syndrome)   . 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 S/P radiation therapy   . Shoulder pain, left     Family History  Problem Relation Age of Onset  . Hypertension Father   . Cancer Father        prostate  . Stroke Father   . COPD Father   . Coronary artery disease Father   . Lung disease Father   . Heart disease Father   . Diabetes Father   . Lung disease Mother   . Diabetes Sister   . Cancer Brother        surgery    SOCIAL HISTORY: Social History   Tobacco Use  . Smoking status:  Former Smoker    Packs/day: 0.50    Years: 10.00    Pack years: 5.00    Types: Cigarettes    Start date: 05/09/1980    Last attempt to quit: 03/16/1986    Years since quitting: 31.2  . Smokeless tobacco: Never Used  Substance Use Topics  . Alcohol use: Yes    Alcohol/week: 0.0 oz    Comment: weekend    Allergies  Allergen Reactions  . Tramadol Nausea And Vomiting  . Oxycodone Nausea Only    Current Outpatient Medications  Medication Sig Dispense Refill  . acetaminophen (TYLENOL) 500 MG tablet Take 500 mg by mouth every 6 (six) hours as needed for mild pain.    [name] CELEBREX 200 MG capsule Take 200 mg by mouth daily.  3  . Cholecalciferol (VITAMIN D PO) Take 1 tablet by mouth daily.    . Dietary Management Product (VASCULERA) TABS Take 1 capsule by mouth daily. 30 tablet 11  . famotidine (PEPCID) 20 MG tablet Take 1 tablet (20 mg total) by mouth 2 (two) times daily. 10 tablet 0  . fluticasone (FLONASE) 50 MCG/ACT nasal spray Place 1 spray into both nostrils daily as needed for allergies.     [name] irbesartan (AVAPRO) 150 MG tablet Take 1  tablet (150 mg total) daily by mouth. (Patient taking differently: Take 150 mg by mouth daily. Taking a fourth of the 150mg  once a day) 90 tablet 1  . Magnesium 250 MG TABS Take 250 mg by mouth daily.    . Multiple Vitamin (MULTI VITAMIN DAILY PO) Take 1 tablet by mouth daily.     [name] omeprazole (PRILOSEC) 20 MG capsule Take 20 mg by mouth 2 (two) times daily as needed (acid reflux). Takes 1 daily and may take a second dose if needed    . ondansetron (ZOFRAN) 4 MG tablet TAKE 1 TABLET BY MOUTH EVERY 6 HOURS AS NEEDED FOR NAUSEA AND VOMITING  1  . Probiotic Product (PROBIOTIC DAILY) CAPS Take 1 capsule by mouth daily.    . sertraline (ZOLOFT) 50 MG tablet Take 1 tablet (50 mg total) by mouth daily. 30 tablet 3  . vitamin C (ASCORBIC ACID) 500 MG tablet Take 500 mg by mouth daily.     No current facility-administered medications for this visit.     REVIEW  OF SYSTEMS:  [X]  denotes positive finding, [ ]  denotes negative finding Cardiac  Comments:  Chest pain or chest pressure:    Shortness of breath upon exertion:    Short of breath when lying flat:    Irregular heart rhythm:        Vascular    Pain in calf, thigh, or hip brought on by ambulation:    Pain in feet at night that wakes you up from your sleep:     Blood clot in your veins:    Leg swelling:  x         PHYSICAL EXAM: Vitals:   06/13/17 1603  BP: 138/80  Pulse: 66  Resp: 16  Temp: 98 F (36.7 C)  SpO2: 100%  Weight: 225 lb (102.1 kg)  Height: 6\' 4"  (1.93 m)    GENERAL: The patient is a well-nourished male, in no acute distress. The vital signs are documented above. CARDIOVASCULAR: Palpable dorsalis pedis pulses bilaterally PULMONARY: There is good air exchange  MUSCULOSKELETAL: There are no major deformities or cyanosis. NEUROLOGIC: No focal weakness or paresthesias are detected. SKIN: There are no ulcers or rashes noted.  Does have hemosiderin deposit around his ankle on the right.  Does have some telangiectasia extensively on the dorsum of his foot PSYCHIATRIC: The patient has a normal affect.  DATA:  I reviewed his prior duplex from May and from October.  This does show reflux in his right great saphenous vein throughout its course.  No significant deep venous reflux with reflux limited only to his right common femoral vein.  Imaged his saphenous vein with sinus site ultrasound and this does show enlargement course of this.  MEDICAL ISSUES: Had long discussion with the patient regarding the significance of his superficial venous hypertension and the swelling pain and skin changes that he is having associated with this.  I do feel he would benefit from laser ablation of his great saphenous vein for reduction of his venous hypertension.  This is almost 6 exclusively related to his superficial system.  I did explain the procedure to include laser ablation with local  tumescent anesthesia as an outpatient with the procedure lasting approximately 1 hour and that he would be ambulatory immediately following procedure.  Did explain the very slight risk of DVT associated with the procedure.  He understands and will schedule at his earliest [place]. Boluwatife Flight, MD FACS [facility] Tel [phone] Pager [phone]

## 2017-06-27 DIAGNOSIS — H2513 Age-related nuclear cataract, bilateral: Secondary | ICD-10-CM | POA: Diagnosis not present

## 2017-07-27 ENCOUNTER — Other Ambulatory Visit: Payer: Self-pay | Admitting: *Deleted

## 2017-07-27 DIAGNOSIS — I83811 Varicose veins of right lower extremities with pain: Secondary | ICD-10-CM

## 2017-07-31 ENCOUNTER — Telehealth: Payer: Self-pay | Admitting: Vascular Surgery

## 2017-07-31 NOTE — Telephone Encounter (Signed)
Left pt vm to confirm 08/17/17 appt. Mailed letter.

## 2017-07-31 NOTE — Telephone Encounter (Signed)
-----   Message from Rolla Flatten, RN sent at 07/27/2017  1:30 PM EDT ----- He needs a one wk fu lab and TFE on 4/11

## 2017-08-07 DIAGNOSIS — M461 Sacroiliitis, not elsewhere classified: Secondary | ICD-10-CM | POA: Diagnosis not present

## 2017-08-10 ENCOUNTER — Encounter: Payer: Self-pay | Admitting: Vascular Surgery

## 2017-08-10 ENCOUNTER — Ambulatory Visit (INDEPENDENT_AMBULATORY_CARE_PROVIDER_SITE_OTHER): Payer: BC Managed Care – PPO | Admitting: Vascular Surgery

## 2017-08-10 VITALS — BP 158/79 | HR 66 | Temp 98.0°F | Resp 15 | Ht 75.0 in | Wt 223.0 lb

## 2017-08-10 DIAGNOSIS — I83811 Varicose veins of right lower extremities with pain: Secondary | ICD-10-CM

## 2017-08-10 DIAGNOSIS — I868 Varicose veins of other specified sites: Secondary | ICD-10-CM

## 2017-08-10 NOTE — Progress Notes (Signed)
     Laser Ablation Procedure    Date: 08/10/2017   Christian Ward DOB:[birth year]  Consent signed: Yes    Surgeon:  Dr. Sherren Mocha Myrtie Leuthold  Procedure: Laser Ablation: right Greater Saphenous Vein  BP (!) 158/79   Pulse 66   Temp 98 F (36.7 C)   Resp 15   Ht 6\' 3"  (1.905 m)   Wt 223 lb (101.2 kg)   SpO2 100%   BMI 27.87 kg/m   Tumescent Anesthesia: 500 cc 0.9% NaCl with 50 cc Lidocaine HCL with 1% Epi and 15 cc 8.4% NaHCO3  Local Anesthesia: 6 cc Lidocaine HCL and NaHCO3 (ratio 2:1)  15 watts continuous mode        Total energy: 2415   Total time: 2:40  Sclerotherapy: .3 %Sotradecol. Patient received a total of 4 cc    Patient tolerated procedure well  Notes:   Description of Procedure:  After marking the course of the secondary varicosities, the patient was placed on the operating table in the supine position, and the right leg was prepped and draped in sterile fashion.   Local anesthetic was administered and under ultrasound guidance the saphenous vein was accessed with a micro needle and guide wire; then the mirco puncture sheath was placed.  A guide wire was inserted saphenofemoral junction , followed by a 5 french sheath.  The position of the sheath and then the laser fiber below the junction was confirmed using the ultrasound.  Tumescent anesthesia was administered along the course of the saphenous vein using ultrasound guidance. The patient was placed in Trendelenburg position and protective laser glasses were placed on patient and staff, and the laser was fired at 15 watts continuous mode advancing 1-74mm/second for a total of 2415 joules.     Sclerotherapy was performed to  varicosities using 4  cc .3% Sotradecol foam via a 27g butterfly needle.  Steri strips were applied to the stab wounds and ABD pads and thigh high compression stockings were applied.  Ace wrap bandages were applied over the phlebectomy sites and at the top of the saphenofemoral junction. Blood loss was  less than 15 cc.  The patient ambulated out of the operating room having tolerated the procedure well.

## 2017-08-17 ENCOUNTER — Ambulatory Visit (HOSPITAL_COMMUNITY)
Admission: RE | Admit: 2017-08-17 | Discharge: 2017-08-17 | Disposition: A | Discharge status: Home / Self Care | Payer: Medicare Other | Source: Ambulatory Visit | Attending: Vascular Surgery | Admitting: Vascular Surgery

## 2017-08-17 ENCOUNTER — Ambulatory Visit (INDEPENDENT_AMBULATORY_CARE_PROVIDER_SITE_OTHER): Payer: BC Managed Care – PPO | Admitting: Vascular Surgery

## 2017-08-17 ENCOUNTER — Other Ambulatory Visit: Payer: Self-pay

## 2017-08-17 ENCOUNTER — Encounter: Payer: Self-pay | Admitting: Vascular Surgery

## 2017-08-17 VITALS — BP 123/74 | HR 71 | Temp 97.6°F | Resp 18 | Ht 76.0 in | Wt 224.0 lb

## 2017-08-17 DIAGNOSIS — I83811 Varicose veins of right lower extremities with pain: Secondary | ICD-10-CM

## 2017-08-17 DIAGNOSIS — I872 Venous insufficiency (chronic) (peripheral): Secondary | ICD-10-CM

## 2017-08-17 NOTE — Progress Notes (Signed)
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 male here today for follow-up.  He did extremely well following his procedure with minimal discomfort.  Also had minimal bruising.  Has been compliant with his compression  Past Medical History:  Diagnosis Date  . Acid reflux   . Cancer ([place])   . 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 Complication of anesthesia    trouble waking up  . Fibromyalgia   . H/O infectious mononucleosis   . History of shingles   . Hypercholesterolemia   . IBS (irritable bowel syndrome)   . 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 S/P radiation therapy   . Shoulder pain, left     Family History  Problem Relation Age of Onset  . Hypertension Father   . Cancer Father        prostate  . Stroke Father   . COPD Father   . Coronary artery disease Father   . Lung disease Father   . Heart disease Father   . Diabetes Father   . Lung disease Mother   . Diabetes Sister   . Cancer Brother        surgery    SOCIAL HISTORY: Social History   Tobacco Use  . Smoking status: Former Smoker    Packs/day: 0.50    Years: 10.00    Pack years: 5.00    Types: Cigarettes    Start date: 05/09/1980    Last attempt to quit: 03/16/1986    Years since quitting: 31.4  . Smokeless tobacco: Never Used  Substance Use Topics  . Alcohol use: Yes    Alcohol/week: 0.0 oz    Comment: weekend    Allergies  Allergen Reactions  . Tramadol Nausea And Vomiting  . Oxycodone Nausea Only    Current Outpatient Medications  Medication Sig Dispense Refill  . acetaminophen (TYLENOL) 500 MG tablet Take 500 mg by mouth every 6 (six) hours as needed for mild pain.    [name] CELEBREX 200  MG capsule Take 200 mg by mouth daily.  3  . Cholecalciferol (VITAMIN D PO) Take 1 tablet by mouth daily.    . Dietary Management Product (VASCULERA) TABS Take 1 capsule by mouth daily. 30 tablet 11  . famotidine (PEPCID) 20 MG tablet Take 1 tablet (20 mg total) by mouth 2 (two) times daily. 10 tablet 0  . fluticasone (FLONASE) 50 MCG/ACT nasal spray Place 1 spray into both nostrils daily as needed for allergies.     [name] irbesartan (AVAPRO) 150 MG tablet Take 1 tablet (150 mg total) daily by mouth. (Patient taking differently: Take 150 mg by mouth daily. Taking a fourth of the 150mg  once a day) 90 tablet 1  . Magnesium 250 MG TABS Take 250 mg by mouth daily.    . Multiple Vitamin (MULTI VITAMIN DAILY PO) Take 1 tablet by mouth daily.     [name] omeprazole (PRILOSEC) 20 MG capsule Take 20 mg by mouth 2 (two) times daily as needed (acid reflux). Takes 1 daily and may take a second dose if needed    . ondansetron (ZOFRAN) 4 MG tablet TAKE 1 TABLET BY MOUTH EVERY 6 HOURS AS NEEDED FOR NAUSEA AND VOMITING  1  .  Probiotic Product (PROBIOTIC DAILY) CAPS Take 1 capsule by mouth daily.    . sertraline (ZOLOFT) 50 MG tablet Take 1 tablet (50 mg total) by mouth daily. 30 tablet 3  . vitamin C (ASCORBIC ACID) 500 MG tablet Take 500 mg by mouth daily.     No current facility-administered medications for this visit.     REVIEW OF SYSTEMS:  [X]  denotes positive finding, [ ]  denotes negative finding Cardiac  Comments:  Chest pain or chest pressure:    Shortness of breath upon exertion:    Short of breath when lying flat:    Irregular heart rhythm:        Vascular    Pain in calf, thigh, or hip brought on by ambulation:    Pain in feet at night that wakes you up from your sleep:     Blood clot in your veins:    Leg swelling:  x         PHYSICAL EXAM: Vitals:   08/17/17 0942  BP: 123/74  Pulse: 71  Resp: 18  Temp: 97.6 F (36.4 C)  TempSrc: Oral  SpO2: 99%  Weight: 224 lb (101.6 kg)  Height: 6\' 4"   (1.93 m)    GENERAL: The patient is a well-nourished male, in no acute distress. The vital signs are documented above. CARDIOVASCULAR: Palpable dorsalis pedis pulse.  Bruising at the ablation site in his distal thigh.  This is quite mild.  No evidence of skin irritation.  Palpable occluded saphenous vein at the medial knee. PULMONARY: There is good air exchange  MUSCULOSKELETAL: There are no major deformities or cyanosis. NEUROLOGIC: No focal weakness or paresthesias are detected. SKIN: There are no ulcers or rashes noted. PSYCHIATRIC: The patient has a normal affect.  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  Excellent result of both of these.  We will continue to wear knee-high compression for 1 additional week and then as needed.  I did explain that he would benefit from extended continued use of knee-high graduated compression garments.  He will see [place] again on an as-needed basis    Rosetta Posner, MD [facility] Tel [phone] Pager [phone]

## 2017-10-06 DIAGNOSIS — M545 Low back pain: Secondary | ICD-10-CM | POA: Diagnosis not present

## 2017-10-12 DIAGNOSIS — M545 Low back pain: Secondary | ICD-10-CM | POA: Diagnosis not present

## 2017-10-16 DIAGNOSIS — C61 Malignant neoplasm of prostate: Secondary | ICD-10-CM | POA: Diagnosis not present

## 2017-10-16 DIAGNOSIS — R972 Elevated prostate specific antigen [PSA]: Secondary | ICD-10-CM | POA: Diagnosis not present

## 2017-10-16 LAB — PSA: PSA: 0.33

## 2017-10-17 DIAGNOSIS — M545 Low back pain: Secondary | ICD-10-CM | POA: Diagnosis not present

## 2017-10-24 DIAGNOSIS — M545 Low back pain: Secondary | ICD-10-CM | POA: Diagnosis not present

## 2017-10-26 DIAGNOSIS — M545 Low back pain: Secondary | ICD-10-CM | POA: Diagnosis not present

## 2017-10-30 DIAGNOSIS — M461 Sacroiliitis, not elsewhere classified: Secondary | ICD-10-CM | POA: Diagnosis not present

## 2017-11-02 ENCOUNTER — Telehealth: Payer: Self-pay | Admitting: Vascular Surgery

## 2017-11-02 ENCOUNTER — Telehealth: Payer: Self-pay | Admitting: *Deleted

## 2017-11-02 DIAGNOSIS — M545 Low back pain: Secondary | ICD-10-CM | POA: Diagnosis not present

## 2017-11-02 NOTE — Telephone Encounter (Signed)
sch appt lvm 11/16/17 945am f/u MD

## 2017-11-02 NOTE — Telephone Encounter (Signed)
Patient called in with concerns about swelling which is still present on top of his right foot and sometimes is present laterally. His laser was done in April. He feels great improvement in the discomfort on the bottom of his foot, but is concerned about this swelling. He wears compression stockings daily and elevates at night. He would like Dr. Donnetta Hutching to look at him and see if there is something else that can be done, or if he is still healing. After a long discussion where I said he probably is still healing, etc., I suggested that we make an appt with [facility]FE where Dr. Donnetta Hutching could take a look with the Sonosite to see if the GSV still is closed. There was closure a week later and the patient is concerned about the cost of a reflux study, so a bedside look might answer the questions. Will schedule him to see [facility] as soon as his schedule allows. Follow prn and pt will continue to elevate and wear his stockings.

## 2017-11-06 DIAGNOSIS — M545 Low back pain: Secondary | ICD-10-CM | POA: Diagnosis not present

## 2017-11-08 DIAGNOSIS — M545 Low back pain: Secondary | ICD-10-CM | POA: Diagnosis not present

## 2017-11-14 DIAGNOSIS — M545 Low back pain: Secondary | ICD-10-CM | POA: Diagnosis not present

## 2017-11-16 ENCOUNTER — Other Ambulatory Visit: Payer: Self-pay

## 2017-11-16 ENCOUNTER — Encounter: Payer: Self-pay | Admitting: Vascular Surgery

## 2017-11-16 ENCOUNTER — Ambulatory Visit: Payer: Medicare Other | Admitting: Vascular Surgery

## 2017-11-16 VITALS — BP 132/82 | HR 70 | Temp 97.6°F | Resp 18 | Ht 76.0 in | Wt 215.0 lb

## 2017-11-16 DIAGNOSIS — I872 Venous insufficiency (chronic) (peripheral): Secondary | ICD-10-CM

## 2017-11-16 DIAGNOSIS — M545 Low back pain: Secondary | ICD-10-CM | POA: Diagnosis not present

## 2017-11-16 NOTE — Progress Notes (Signed)
[facility] Specialist of [place]  Patient name: Christian Ward MRN: [id number] DOB: [birth year] Sex: male  REASON FOR VISIT: Follow-up right leg venous hypertension  HPI: Christian Ward is a 67 y.o. male here today for follow-up.  He is status post ablation of his right great saphenous vein in April 2019.  He reports that the swelling and discomfort that he had preprocedure is markedly improved.  He is concerned about some slight swelling over the lateral aspect of the dorsum of his right foot.  He reports that he can have a pins-and-needles sensation over this area occasionally as well.  Has been compliant with his compression garments.  Does notice some telangiectasia on his left leg as well  Past Medical History:  Diagnosis Date  . Acid reflux   . Cancer ([place])   . 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 Complication of anesthesia    trouble waking up  . Fibromyalgia   . H/O infectious mononucleosis   . History of shingles   . Hypercholesterolemia   . IBS (irritable bowel syndrome)   . 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 S/P radiation therapy   . Shoulder pain, left     Family History  Problem Relation Age of Onset  . Hypertension Father   . Cancer Father        prostate  . Stroke Father   . COPD Father   . Coronary artery disease Father   . Lung disease Father   . Heart disease Father   . Diabetes Father   . Lung disease Mother   . Diabetes Sister   . Cancer Brother        surgery    SOCIAL HISTORY: Social History   Tobacco Use  . Smoking status: Former Smoker    Packs/day: 0.50    Years: 10.00    Pack years: 5.00    Types: Cigarettes    Start date: 05/09/1980    Last attempt to quit: 03/16/1986    Years since quitting: 31.6  . Smokeless tobacco: Never Used  Substance Use Topics  . Alcohol use: Yes    Alcohol/week: 0.0 oz    Comment: weekend    Allergies  Allergen Reactions  . Tramadol  Nausea And Vomiting  . Oxycodone Nausea Only    Current Outpatient Medications  Medication Sig Dispense Refill  . acetaminophen (TYLENOL) 500 MG tablet Take 500 mg by mouth every 6 (six) hours as needed for mild pain.    [name] CELEBREX 200 MG capsule Take 200 mg by mouth daily.  3  . Cholecalciferol (VITAMIN D PO) Take 1 tablet by mouth daily.    . Cholecalciferol (VITAMIN D3) 100000 UNIT/GM POWD Take by mouth.    . Dietary Management Product (VASCULERA) TABS Take 1 capsule by mouth daily. 30 tablet 11  . famotidine (PEPCID) 20 MG tablet Take 1 tablet (20 mg total) by mouth 2 (two) times daily. 10 tablet 0  . fluticasone (FLONASE) 50 MCG/ACT nasal spray Place 1 spray into both nostrils daily as needed for allergies.     [name] irbesartan (AVAPRO) 150 MG tablet Take 1 tablet (150 mg total) daily by mouth. (Patient taking differently: Take 150 mg by mouth daily. Taking a fourth of the 150mg  once a day) 90 tablet 1  . Lactobacillus Rhamnosus, GG, (CULTURELLE) CAPS Take by mouth.    . Magnesium 250 MG TABS Take 250 mg by mouth daily.    [name]  Multiple Vitamin (MULTI VITAMIN DAILY PO) Take 1 tablet by mouth daily.     [name] omeprazole (PRILOSEC) 20 MG capsule Take 20 mg by mouth 2 (two) times daily as needed (acid reflux). Takes 1 daily and may take a second dose if needed    . ondansetron (ZOFRAN) 4 MG tablet TAKE 1 TABLET BY MOUTH EVERY 6 HOURS AS NEEDED FOR NAUSEA AND VOMITING  1  . Probiotic Product (PROBIOTIC DAILY) CAPS Take 1 capsule by mouth daily.    . vitamin C (ASCORBIC ACID) 500 MG tablet Take 500 mg by mouth daily.    . sertraline (ZOLOFT) 50 MG tablet Take 1 tablet (50 mg total) by mouth daily. (Patient not taking: Reported on 11/16/2017) 30 tablet 3   No current facility-administered medications for this visit.     REVIEW OF SYSTEMS:  [X]  denotes positive finding, [ ]  denotes negative finding Cardiac  Comments:  Chest pain or chest pressure:    Shortness of breath upon exertion:    Short of  breath when lying flat:    Irregular heart rhythm:        Vascular    Pain in calf, thigh, or hip brought on by ambulation:    Pain in feet at night that wakes you up from your sleep:     Blood clot in your veins:    Leg swelling:  x         PHYSICAL EXAM: Vitals:   11/16/17 1143  BP: 132/82  Pulse: 70  Resp: 18  Temp: 97.6 F (36.4 C)  TempSrc: Oral  SpO2: 99%  Weight: 215 lb (97.5 kg)  Height: 6\' 4"  (1.93 m)    GENERAL: The patient is a well-nourished male, in no acute distress. The vital signs are documented above. CARDIOVASCULAR: No evidence of varicosities bilaterally.  Diffuse telangiectasia.  Some staining with hemosiderin in the right ankle and right foot dorsum where he had had sclerotherapy PULMONARY: There is good air exchange  MUSCULOSKELETAL: There are no major deformities or cyanosis. NEUROLOGIC: No focal weakness or paresthesias are detected. SKIN: There are no ulcers or rashes noted. PSYCHIATRIC: The patient has a normal affect.  DATA:  He did not have a formal venous duplex today.  I imaged his right saphenous vein with SonoSite and this does show closure of his great saphenous vein.  Also imaged his left saphenous vein.  At the level of his distal thigh and calf he does have a small caliber great saphenous vein.  He does have a accessory branch that is somewhat larger and subcutaneous.  MEDICAL ISSUES: Patient.  He is quite pleased with his result.  Was just making sure there was not anything further regarding the mild symptoms he is having on the dorsum of his foot.  I do not see any evidence of venous hypertension to cause this and explained that this in all likelihood would not cause the pins-and-needles sensation that he is having.  He was reassured with this discussion will see [place] again on an as-needed basis    Rosetta Posner, MD [facility] Vascular and Vein Specialists of [facility] Tel [phone] Pager [phone]

## 2017-11-21 DIAGNOSIS — R42 Dizziness and giddiness: Secondary | ICD-10-CM | POA: Diagnosis not present

## 2017-11-21 DIAGNOSIS — J019 Acute sinusitis, unspecified: Secondary | ICD-10-CM | POA: Diagnosis not present

## 2017-11-23 DIAGNOSIS — M545 Low back pain: Secondary | ICD-10-CM | POA: Diagnosis not present

## 2017-11-27 ENCOUNTER — Ambulatory Visit (INDEPENDENT_AMBULATORY_CARE_PROVIDER_SITE_OTHER): Payer: Medicare Other | Admitting: Internal Medicine

## 2017-11-27 ENCOUNTER — Encounter: Payer: Self-pay | Admitting: Internal Medicine

## 2017-11-27 ENCOUNTER — Other Ambulatory Visit (INDEPENDENT_AMBULATORY_CARE_PROVIDER_SITE_OTHER): Payer: Medicare Other

## 2017-11-27 VITALS — BP 148/80 | HR 62 | Temp 97.9°F | Resp 16 | Ht 76.0 in | Wt 219.5 lb

## 2017-11-27 DIAGNOSIS — Z0000 Encounter for general adult medical examination without abnormal findings: Secondary | ICD-10-CM | POA: Diagnosis not present

## 2017-11-27 DIAGNOSIS — I10 Essential (primary) hypertension: Secondary | ICD-10-CM | POA: Diagnosis not present

## 2017-11-27 DIAGNOSIS — G3281 Cerebellar ataxia in diseases classified elsewhere: Secondary | ICD-10-CM

## 2017-11-27 DIAGNOSIS — R51 Headache: Secondary | ICD-10-CM | POA: Diagnosis not present

## 2017-11-27 DIAGNOSIS — R270 Ataxia, unspecified: Secondary | ICD-10-CM | POA: Diagnosis not present

## 2017-11-27 DIAGNOSIS — R519 Headache, unspecified: Secondary | ICD-10-CM

## 2017-11-27 DIAGNOSIS — E7800 Pure hypercholesterolemia, unspecified: Secondary | ICD-10-CM

## 2017-11-27 DIAGNOSIS — C61 Malignant neoplasm of prostate: Secondary | ICD-10-CM

## 2017-11-27 LAB — CBC WITH DIFFERENTIAL/PLATELET
Basophils Absolute: 0 10*3/uL (ref 0.0–0.1)
Basophils Relative: 0.5 % (ref 0.0–3.0)
Eosinophils Absolute: 0.1 10*3/uL (ref 0.0–0.7)
Eosinophils Relative: 2.3 % (ref 0.0–5.0)
HCT: 37.7 % — ABNORMAL LOW (ref 39.0–52.0)
Hemoglobin: 12.9 g/dL — ABNORMAL LOW (ref 13.0–17.0)
Lymphocytes Relative: 22.3 % (ref 12.0–46.0)
Lymphs Abs: 1.1 10*3/uL (ref 0.7–4.0)
MCHC: 34.1 g/dL (ref 30.0–36.0)
MCV: 91.5 fl (ref 78.0–100.0)
MONOS PCT: 11 % (ref 3.0–12.0)
Monocytes Absolute: 0.6 10*3/uL (ref 0.1–1.0)
Neutro Abs: 3.2 10*3/uL (ref 1.4–7.7)
Neutrophils Relative %: 63.9 % (ref 43.0–77.0)
Platelets: 191 10*3/uL (ref 150.0–400.0)
RBC: 4.12 Mil/uL — AB (ref 4.22–5.81)
RDW: 13.4 % (ref 11.5–15.5)
WBC: 5 10*3/uL (ref 4.0–10.5)

## 2017-11-27 LAB — COMPREHENSIVE METABOLIC PANEL
ALBUMIN: 4.2 g/dL (ref 3.5–5.2)
ALK PHOS: 68 U/L (ref 39–117)
ALT: 17 U/L (ref 0–53)
AST: 18 U/L (ref 0–37)
BUN: 17 mg/dL (ref 6–23)
CO2: 31 mEq/L (ref 19–32)
Calcium: 9.2 mg/dL (ref 8.4–10.5)
Chloride: 102 mEq/L (ref 96–112)
Creatinine, Ser: 1.01 mg/dL (ref 0.40–1.50)
GFR: 78.46 mL/min (ref >60.00)
Glucose, Bld: 91 mg/dL (ref 70–99)
Potassium: 4 mEq/L (ref 3.5–5.1)
Sodium: 139 mEq/L (ref 135–145)
TOTAL PROTEIN: 6.8 g/dL (ref 6.0–8.3)
Total Bilirubin: 0.8 mg/dL (ref 0.2–1.2)

## 2017-11-27 LAB — LIPID PANEL
Cholesterol: 154 mg/dL (ref 0–200)
HDL: 40.2 mg/dL (ref >39.00)
LDL Cholesterol: 99 mg/dL (ref 0–99)
NonHDL: 114.07
Total CHOL/HDL Ratio: 4
Triglycerides: 73 mg/dL (ref 0.0–149.0)
VLDL: 14.6 mg/dL (ref 0.0–40.0)

## 2017-11-27 LAB — TSH: TSH: 1.59 u[IU]/mL (ref 0.35–4.50)

## 2017-11-27 LAB — VITAMIN D 25 HYDROXY (VIT D DEFICIENCY, FRACTURES): VITD: 66.59 ng/mL (ref 30.00–100.00)

## 2017-11-27 LAB — SEDIMENTATION RATE: Sed Rate: 10 mm/hr (ref 0–20)

## 2017-11-27 LAB — FOLATE

## 2017-11-27 LAB — VITAMIN B12: Vitamin B-12: 356 pg/mL (ref 211–911)

## 2017-11-27 NOTE — Patient Instructions (Signed)

## 2017-11-27 NOTE — Progress Notes (Signed)
Subjective:  Patient ID: Christian Ward, male    DOB: 05/02/1951  Age: 67 y.o. MRN: [id number]  CC: Annual Exam; Hypertension; and Headache   HPI DARRNELL MANGIARACINA presents for a CPX.  For 5 weeks now he has complained of decreased visual acuity, intermittent dizziness and vertigo, nausea, and ataxia.  He tells me he saw ophthalmology 3 weeks ago and was told that his eyes were okay.  He saw ENT a week ago and amoxicillin was prescribed.  He is taking half the amoxil dose not the full dose.  He retired 2 months ago and feels stressed free and says he does not need Zoloft anymore.  He complains of a pressure sensation around his forehead and his eyes.  He reports that Celebrex helps some with his symptoms.  He is aware that his blood pressure is not adequately controlled but he decided to take one fourth the dose of irbesartan instead of the whole dose.  He is not sure why he made that change.  He tells me he saw his urologist about 2 months ago.  Outpatient Medications Prior to Visit  Medication Sig Dispense Refill  . acetaminophen (TYLENOL) 500 MG tablet Take 500 mg by mouth every 6 (six) hours as needed for mild pain.    [name] CELEBREX 200 MG capsule Take 200 mg by mouth daily.  3  . Cholecalciferol (VITAMIN D3) 100000 UNIT/GM POWD Take by mouth.    . Dietary Management Product (VASCULERA) TABS Take 1 capsule by mouth daily. 30 tablet 11  . famotidine (PEPCID) 20 MG tablet Take 1 tablet (20 mg total) by mouth 2 (two) times daily. 10 tablet 0  . fluticasone (FLONASE) 50 MCG/ACT nasal spray Place 1 spray into both nostrils daily as needed for allergies.     . Magnesium 250 MG TABS Take 250 mg by mouth daily.    . Multiple Vitamin (MULTI VITAMIN DAILY PO) Take 1 tablet by mouth daily.     [name] omeprazole (PRILOSEC) 20 MG capsule Take 20 mg by mouth 2 (two) times daily as needed (acid reflux). Takes 1 daily and may take a second dose if needed    . Probiotic Product (PROBIOTIC DAILY) CAPS Take 1  capsule by mouth daily.    . vitamin C (ASCORBIC ACID) 500 MG tablet Take 500 mg by mouth daily.    . Cholecalciferol (VITAMIN D PO) Take 1 tablet by mouth daily.    . irbesartan (AVAPRO) 150 MG tablet Take 1 tablet (150 mg total) daily by mouth. (Patient taking differently: Take 150 mg by mouth daily. Taking a fourth of the 157m once a day) 90 tablet 1  . ondansetron (ZOFRAN) 4 MG tablet TAKE 1 TABLET BY MOUTH EVERY 6 HOURS AS NEEDED FOR NAUSEA AND VOMITING  1  . Lactobacillus Rhamnosus, GG, (CULTURELLE) CAPS Take by mouth.    . sertraline (ZOLOFT) 50 MG tablet Take 1 tablet (50 mg total) by mouth daily. (Patient not taking: Reported on 11/16/2017) 30 tablet 3   No facility-administered medications prior to visit.     ROS Review of Systems  Constitutional: Negative.  Negative for diaphoresis and fatigue.  HENT: Positive for congestion, hearing loss and postnasal drip. Negative for ear pain, facial swelling, sinus pressure, sinus pain, sore throat, tinnitus and trouble swallowing.   Eyes: Positive for visual disturbance. Negative for pain.  Respiratory: Negative.  Negative for cough, chest tightness, shortness of breath and wheezing.   Cardiovascular: Negative for chest pain, palpitations  and leg swelling.  Gastrointestinal: Positive for nausea. Negative for abdominal pain, constipation, diarrhea and vomiting.  Endocrine: Negative.   Genitourinary: Negative.  Negative for difficulty urinating, scrotal swelling, testicular pain and urgency.  Musculoskeletal: Positive for back pain and gait problem. Negative for arthralgias, myalgias and neck pain.  Skin: Negative.  Negative for color change and rash.  Neurological: Positive for dizziness and headaches. Negative for tremors, seizures, syncope, facial asymmetry, speech difficulty, weakness, light-headedness and numbness.  Hematological: Negative for adenopathy. Does not bruise/bleed easily.  Psychiatric/Behavioral: Negative.     Objective:    BP (!) 148/80 (BP Location: Left Arm, Patient Position: Sitting, Cuff Size: Normal)   Pulse 62   Temp 97.9 F (36.6 C) (Oral)   Resp 16   Ht _0  (1.93 m)   Wt 219 lb 8 oz (99.6 kg)   SpO2 98%   BMI 26.72 kg/m   BP Readings from Last 3 Encounters:  11/27/17 (!) 148/80  11/16/17 132/82  08/17/17 123/74    Wt Readings from Last 3 Encounters:  11/27/17 219 lb 8 oz (99.6 kg)  11/16/17 215 lb (97.5 kg)  08/17/17 224 lb (101.6 kg)    Physical Exam  Constitutional: No distress.  HENT:  Mouth/Throat: Oropharynx is clear and moist. No oropharyngeal exudate.  Eyes: Pupils are equal, round, and reactive to light. Conjunctivae and EOM are normal. Right eye exhibits no discharge. Left eye exhibits no discharge. No scleral icterus.  Neck: Normal range of motion. Neck supple. No JVD present. No thyromegaly present.  Cardiovascular: Normal rate, regular rhythm and normal heart sounds. Exam reveals no gallop.  No murmur heard. Pulmonary/Chest: Effort normal and breath sounds normal. He has no decreased breath sounds. He has no wheezes. He has no rhonchi. He has no rales.  Abdominal: Soft. Bowel sounds are normal. He exhibits no mass. There is no tenderness.  Genitourinary:  Genitourinary Comments: GU and rectal exams were deferred at his request since he recently had this done by urology.  Musculoskeletal: Normal range of motion. He exhibits no edema or tenderness.  Lymphadenopathy:    He has no cervical adenopathy.  Neurological: He is alert. He has normal strength. He displays abnormal reflex. He displays no atrophy and no tremor. No cranial nerve deficit or sensory deficit. He exhibits normal muscle tone. He displays a negative Romberg sign. He displays no seizure activity. Coordination and gait abnormal. He displays Babinski's sign on the right side. He displays no Babinski's sign on the left side.  Reflex Scores:      Tricep reflexes are 1+ on the right side and 2+ on the left side.       Bicep reflexes are 1+ on the right side and 2+ on the left side.      Brachioradialis reflexes are 0 on the right side and 0 on the left side.      Patellar reflexes are 2+ on the right side and 1+ on the left side.      Achilles reflexes are 2+ on the right side and 1+ on the left side. Skin: Skin is warm and dry. He is not diaphoretic.  Psychiatric: He has a normal mood and affect. His behavior is normal. His mood appears not anxious. He is not agitated.  Vitals reviewed.   Lab Results  Component Value Date   WBC 5.0 11/27/2017   HGB 12.9 (L) 11/27/2017   HCT 37.7 (L) 11/27/2017   PLT 191.0 11/27/2017   GLUCOSE 91 11/27/2017  CHOL 154 11/27/2017   TRIG 73.0 11/27/2017   HDL 40.20 11/27/2017   LDLCALC 99 11/27/2017   ALT 17 11/27/2017   AST 18 11/27/2017   NA 139 11/27/2017   K 4.0 11/27/2017   CL 102 11/27/2017   CREATININE 1.01 11/27/2017   BUN 17 11/27/2017   CO2 31 11/27/2017   TSH 1.59 11/27/2017   PSA 0.33 10/16/2017   INR 1.06 08/31/2016    No results found.  Assessment & Plan:   [place] was seen today for annual exam, hypertension and headache.  Diagnoses and all orders for this visit:  ADENOCARCINOMA, PROSTATE-it is reassuring that his recent PSA was down to 0.33.  Routine general medical examination at a health care facility-exam completed, labs reviewed, vaccines reviewed and updated, screening for colon cancer is up-to-date, patient education material was given.  Essential hypertension-his blood pressure is not adequately well controlled.  His labs are negative for secondary causes or endorgan damage.  I have asked him to be compliant with the full dose of irbesartan. -     CBC with Differential/Platelet; Future -     Comprehensive metabolic panel; Future -     TSH; Future -     VITAMIN D 25 Hydroxy (Vit-D Deficiency, Fractures); Future  HYPERCHOLESTEROLEMIA, MILD-he has an elevated ASCVD risk score so have asked him to start a statin for CV risk  reduction. -     Lipid panel; Future  Intractable episodic headache, unspecified headache type- see below -     Sedimentation rate; Future -     MR Brain Wo Contrast; Future -     CBC with Differential/Platelet; Future  Ataxia- His lab work is negative for secondary causes.  He has a myriad of symptoms and a remarkably abnormal neurologic exam.  I have asked him to undergo an MRI of the brain to see if there is a tumor, mass, bleed, or CVA. -     Vitamin B12; Future -     Folate; Future -     MR Brain Wo Contrast; Future  Cerebellar ataxia in diseases classified elsewhere [facility])- see above -     Vitamin B12; Future -     Folate; Future -     MR Brain Wo Contrast; Future   I have discontinued Lanny Hurst A. Fromm's Cholecalciferol (VITAMIN D PO), ondansetron, sertraline, and CULTURELLE. I have also changed his irbesartan. Additionally, I am having him start on rosuvastatin. Lastly, I am having him maintain his omeprazole, PROBIOTIC DAILY, fluticasone, Multiple Vitamin (MULTI VITAMIN DAILY PO), Magnesium, acetaminophen, CELEBREX, famotidine, vitamin C, VASCULERA, and Vitamin D3.  Meds ordered this encounter  Medications  . rosuvastatin (CRESTOR) 5 MG tablet    Sig: Take 1 tablet (5 mg total) by mouth daily.    Dispense:  90 tablet    Refill:  1  . irbesartan (AVAPRO) 150 MG tablet    Sig: Take 1 tablet (150 mg total) by mouth daily.    Dispense:  90 tablet    Refill:  1     Follow-up: Return in about 3 months (around 02/27/2018).  Scarlette Calico, MD

## 2017-11-28 MED ORDER — IRBESARTAN 150 MG PO TABS: 150.0000 mg | ORAL_TABLET | Freq: Every day | ORAL | 1 refills | Status: DC

## 2017-11-28 MED ORDER — ROSUVASTATIN CALCIUM 5 MG PO TABS: 5.0000 mg | ORAL_TABLET | Freq: Every day | ORAL | 1 refills | Status: DC

## 2017-11-30 ENCOUNTER — Telehealth: Payer: Self-pay | Admitting: Internal Medicine

## 2017-11-30 NOTE — Telephone Encounter (Signed)
LVM for pt to call back as soon as possible.   Letter in chart with results.

## 2017-11-30 NOTE — Telephone Encounter (Signed)
Copied from [place] [id number]. Topic: General - Other >> Nov 30, 2017  1:42 PM Yvette Rack wrote: Reason for CRM: pt calling about lab results he states that the pharmacy had told him he has a Rx to pick up but he hasnt gotten his lab results yet please give him a call when labs are ready

## 2017-11-30 NOTE — Telephone Encounter (Signed)
Patient would like a call back as soon as possible regarding results.

## 2017-12-01 NOTE — Telephone Encounter (Signed)
Pt informed of 11/27/17 lab results. He states he received the lab letter and was notified by his pharmacy of the new Crestor 5 mg Rx, but the lab letter doesn't mention the new med or anything pertaining to his lipid panel. He is not picking up the Crestor until he hears back from [place] about this.   Pt is aware MD is out of office until 12/04/17. Please advise.

## 2017-12-01 NOTE — Telephone Encounter (Signed)
I called pt- he states he can not talk at the moment as he is in a meeting. Will call back later.

## 2017-12-02 NOTE — Telephone Encounter (Signed)
After I wrote his letter I did a calculation of his risk of having a heart attack or stroke in the next 10 years.  The risk was elevated.  Even though his cholesterol level does not look that bad, taking a statin will help reduce his risk of heart attack and stroke.  I therefore recommend that he start taking the rosuvastatin.

## 2017-12-04 DIAGNOSIS — M545 Low back pain: Secondary | ICD-10-CM | POA: Diagnosis not present

## 2017-12-04 NOTE — Telephone Encounter (Signed)
Left mess for patient to call back to inform pt of below. CRM created.

## 2017-12-05 DIAGNOSIS — R42 Dizziness and giddiness: Secondary | ICD-10-CM | POA: Diagnosis not present

## 2017-12-05 DIAGNOSIS — H903 Sensorineural hearing loss, bilateral: Secondary | ICD-10-CM | POA: Diagnosis not present

## 2017-12-12 NOTE — Telephone Encounter (Signed)
Left detailed message informing pt of below and to discuss this further with [name] at his 3 mo f/u or call us back if he has further questions.

## 2017-12-13 ENCOUNTER — Ambulatory Visit
Admission: RE | Admit: 2017-12-13 | Discharge: 2017-12-13 | Disposition: A | Discharge status: Home / Self Care | Payer: Medicare Other | Source: Ambulatory Visit | Attending: Internal Medicine | Admitting: Internal Medicine

## 2017-12-13 DIAGNOSIS — R519 Headache, unspecified: Secondary | ICD-10-CM

## 2017-12-13 DIAGNOSIS — R51 Headache: Secondary | ICD-10-CM

## 2017-12-13 DIAGNOSIS — R42 Dizziness and giddiness: Secondary | ICD-10-CM | POA: Diagnosis not present

## 2017-12-13 DIAGNOSIS — G3281 Cerebellar ataxia in diseases classified elsewhere: Secondary | ICD-10-CM | POA: Diagnosis not present

## 2017-12-13 DIAGNOSIS — R270 Ataxia, unspecified: Secondary | ICD-10-CM

## 2017-12-13 NOTE — Telephone Encounter (Signed)
Spoke to pt about recent labs. Pt given PCP response below. Pt stated understanding.

## 2017-12-27 ENCOUNTER — Encounter: Payer: Self-pay | Admitting: Internal Medicine

## 2017-12-27 ENCOUNTER — Ambulatory Visit (INDEPENDENT_AMBULATORY_CARE_PROVIDER_SITE_OTHER): Payer: Medicare Other | Admitting: Internal Medicine

## 2017-12-27 VITALS — BP 162/94 | HR 61 | Temp 98.0°F | Resp 16 | Ht 76.0 in | Wt 220.8 lb

## 2017-12-27 DIAGNOSIS — F418 Other specified anxiety disorders: Secondary | ICD-10-CM

## 2017-12-27 DIAGNOSIS — R0989 Other specified symptoms and signs involving the circulatory and respiratory systems: Secondary | ICD-10-CM

## 2017-12-27 DIAGNOSIS — I10 Essential (primary) hypertension: Secondary | ICD-10-CM | POA: Diagnosis not present

## 2017-12-27 MED ORDER — VILAZODONE HCL 10 & 20 MG PO KIT: 1.0000 | PACK | Freq: Every day | ORAL | 0 refills | Status: DC

## 2017-12-27 NOTE — Progress Notes (Signed)
Subjective:  Patient ID: Christian Ward, male    DOB: August 10, 1950  Age: 67 y.o. MRN: [id number]  CC: Hypertension and Depression   HPI Christian Ward presents for f/up -he is concerned that his blood pressure is not well controlled.  He is not taking the full dose of the ARB.  He says he has been too anxious to take the full dose.  In fact, he feels overwhelmed with a smoldering sensation of doom, anxiety, panic, and anhedonia.  He is willing to try another antidepressant but he also wants something that is more like a quick fix to help the feeling that he has right now.  He is also concerned that his recent MRI showed micro-vascular damages and he wants to undergo a carotid ultrasound to see if he has blocked arteries in his carotids.  He recently saw an ENT doctor for concerns about his hearing, crackling sensation in his ears, and ear discomfort.  He tells me his Dix-Hallpike maneuver was normal and his hearing tests were normal.  Outpatient Medications Prior to Visit  Medication Sig Dispense Refill  . acetaminophen (TYLENOL) 500 MG tablet Take 500 mg by mouth every 6 (six) hours as needed for mild pain.    [name] CELEBREX 200 MG capsule Take 200 mg by mouth daily.  3  . Cholecalciferol (VITAMIN D3) 100000 UNIT/GM POWD Take by mouth.    . Dietary Management Product (VASCULERA) TABS Take 1 capsule by mouth daily. 30 tablet 11  . famotidine (PEPCID) 20 MG tablet Take 1 tablet (20 mg total) by mouth 2 (two) times daily. 10 tablet 0  . fluticasone (FLONASE) 50 MCG/ACT nasal spray Place 1 spray into both nostrils daily as needed for allergies.     [name] irbesartan (AVAPRO) 150 MG tablet Take 1 tablet (150 mg total) by mouth daily. 90 tablet 1  . Magnesium 250 MG TABS Take 250 mg by mouth daily.    [name] omeprazole (PRILOSEC) 20 MG capsule Take 20 mg by mouth 2 (two) times daily as needed (acid reflux). Takes 1 daily and may take a second dose if needed    . Probiotic Product (PROBIOTIC DAILY) CAPS Take 1  capsule by mouth daily.    . rosuvastatin (CRESTOR) 5 MG tablet Take 1 tablet (5 mg total) by mouth daily. 90 tablet 1  . vitamin C (ASCORBIC ACID) 500 MG tablet Take 500 mg by mouth daily.    . Multiple Vitamin (MULTI VITAMIN DAILY PO) Take 1 tablet by mouth daily.      No facility-administered medications prior to visit.     ROS Review of Systems  Constitutional: Negative.  Negative for diaphoresis, fatigue and unexpected weight change.  HENT: Negative.  Negative for trouble swallowing and voice change.   Eyes: Negative for visual disturbance.  Respiratory: Negative for cough, chest tightness, shortness of breath and wheezing.   Cardiovascular: Negative for chest pain, palpitations and leg swelling.  Gastrointestinal: Negative for abdominal pain, constipation, diarrhea, nausea and vomiting.  Endocrine: Negative.   Genitourinary: Negative.  Negative for difficulty urinating.  Musculoskeletal: Negative.  Negative for arthralgias and neck pain.  Skin: Negative.  Negative for color change.  Neurological: Negative.  Negative for dizziness, weakness, light-headedness and headaches.  Hematological: Negative for adenopathy. Does not bruise/bleed easily.  Psychiatric/Behavioral: Positive for decreased concentration, dysphoric mood and sleep disturbance. Negative for agitation, behavioral problems, confusion, hallucinations, self-injury and suicidal ideas. The patient is nervous/anxious. The patient is not hyperactive.  Objective:  BP (!) 162/94 (BP Location: Left Arm, Patient Position: Sitting, Cuff Size: Normal)   Pulse 61   Temp 98 F (36.7 C) (Oral)   Resp 16   Ht _0  (1.93 m)   Wt 220 lb 12 oz (100.1 kg)   SpO2 99%   BMI 26.87 kg/m   BP Readings from Last 3 Encounters:  12/27/17 (!) 162/94  11/27/17 (!) 148/80  11/16/17 132/82    Wt Readings from Last 3 Encounters:  12/27/17 220 lb 12 oz (100.1 kg)  11/27/17 219 lb 8 oz (99.6 kg)  11/16/17 215 lb (97.5 kg)     Physical Exam  Constitutional: He is oriented to person, place, and time. No distress.  HENT:  Mouth/Throat: Oropharynx is clear and moist. No oropharyngeal exudate.  Eyes: Conjunctivae are normal. No scleral icterus.  Neck: Full passive range of motion without pain. Neck supple. No JVD present. Carotid bruit is present. No thyroid mass and no thyromegaly present.  Cardiovascular: Normal rate, regular rhythm and normal heart sounds. Exam reveals no gallop and no friction rub.  No murmur heard. Pulmonary/Chest: Effort normal and breath sounds normal. No stridor. He has no wheezes. He has no rales.  Abdominal: Soft. Bowel sounds are normal. He exhibits no mass. There is no hepatosplenomegaly. There is no tenderness.  Musculoskeletal: Normal range of motion. He exhibits no edema or deformity.  Neurological: He is alert and oriented to person, place, and time.  Skin: Skin is warm and dry. No rash noted. He is not diaphoretic.  Psychiatric: His behavior is normal. Judgment and thought content normal. His mood appears anxious. His affect is not inappropriate. His speech is tangential. His speech is not rapid and/or pressured and not delayed. He is not slowed and not withdrawn. Thought content is not paranoid. Cognition and memory are normal. He exhibits a depressed mood. He expresses no homicidal and no suicidal ideation.  Vitals reviewed.   Lab Results  Component Value Date   WBC 5.0 11/27/2017   HGB 12.9 (L) 11/27/2017   HCT 37.7 (L) 11/27/2017   PLT 191.0 11/27/2017   GLUCOSE 91 11/27/2017   CHOL 154 11/27/2017   TRIG 73.0 11/27/2017   HDL 40.20 11/27/2017   LDLCALC 99 11/27/2017   ALT 17 11/27/2017   AST 18 11/27/2017   NA 139 11/27/2017   K 4.0 11/27/2017   CL 102 11/27/2017   CREATININE 1.01 11/27/2017   BUN 17 11/27/2017   CO2 31 11/27/2017   TSH 1.59 11/27/2017   PSA 0.33 10/16/2017   INR 1.06 08/31/2016    Mr Brain Wo Contrast  Result Date: 12/13/2017 CLINICAL DATA:   Cerebellar ataxia, slowly progressive. Vertigo and nausea for 5 weeks. EXAM: MRI HEAD WITHOUT CONTRAST TECHNIQUE: Multiplanar, multiecho pulse sequences of the brain and surrounding structures were obtained without intravenous contrast. COMPARISON:  Images and report from brain MRI 02/12/1998 is not available for review FINDINGS: Brain: Subcentimeter DWI hyperintensity within the left posterior pons, FLAIR hyperintense. This finding is isointense on the ADC map. There is mild patchy FLAIR hyperintensity elsewhere within the pons and in the bilateral cerebral white matter. No hemorrhage, hydrocephalus, or masslike finding. Mega cisterna magna. No cerebellar or brainstem atrophy. Vascular: Major flow voids are preserved. Arachnoid granulations are present along the bilateral transverse sinuses. Skull and upper cervical spine: No evident marrow lesion. Sinuses/Orbits: Negative IMPRESSION: 1. Small signal abnormalities in the cerebral white matter and pons to a mild degree, usually related to microvascular ischemia.  1 of these, in the dorsal left pons, may have occurred recently. 2. No specific sign of neurodegenerative disease. No atrophy in the brainstem or cerebellum. Electronically Signed   By: Monte Fantasia M.D.   On: 12/13/2017 14:22    Assessment & Plan:   Arbor was seen today for hypertension and depression.  Diagnoses and all orders for this visit:  Depression with anxiety- Will start Viibryd and slowly increase the dose.  Will also start a benzodiazepine to bridge him until the antidepressant takes effect. -     Vilazodone HCl (VIIBRYD STARTER PACK) 10 & 20 MG KIT; Take 1 tablet by mouth daily. -     clonazePAM (KLONOPIN) 0.5 MG tablet; Take 1 tablet (0.5 mg total) by mouth 2 (two) times daily as needed for anxiety.  Essential hypertension- His blood pressure is not adequately well controlled.  He agrees to go ahead and take the full dose of the ARB.  Bilateral carotid bruits -     VAS US  CAROTID; Future   I have discontinued Lanny Hurst A. Lazarz's Multiple Vitamin (MULTI VITAMIN DAILY PO). I am also having him start on Vilazodone HCl and clonazePAM. Additionally, I am having him maintain his omeprazole, PROBIOTIC DAILY, fluticasone, Magnesium, acetaminophen, CELEBREX, famotidine, vitamin C, VASCULERA, Vitamin D3, rosuvastatin, and irbesartan.  Meds ordered this encounter  Medications  . Vilazodone HCl (VIIBRYD STARTER PACK) 10 & 20 MG KIT    Sig: Take 1 tablet by mouth daily.    Dispense:  1 kit    Refill:  0  . clonazePAM (KLONOPIN) 0.5 MG tablet    Sig: Take 1 tablet (0.5 mg total) by mouth 2 (two) times daily as needed for anxiety.    Dispense:  60 tablet    Refill:  3     Follow-up: Return in about 3 months (around 03/29/2018).  Scarlette Calico, MD

## 2017-12-27 NOTE — Patient Instructions (Signed)

## 2017-12-28 MED ORDER — CLONAZEPAM 0.5 MG PO TABS
0.5000 mg | ORAL_TABLET | Freq: Two times a day (BID) | ORAL | 3 refills | Status: DC | PRN
Start: 2017-12-28 — End: 2019-09-16

## 2018-01-03 DIAGNOSIS — H6983 Other specified disorders of Eustachian tube, bilateral: Secondary | ICD-10-CM | POA: Diagnosis not present

## 2018-01-03 DIAGNOSIS — H6123 Impacted cerumen, bilateral: Secondary | ICD-10-CM | POA: Diagnosis not present

## 2018-01-03 DIAGNOSIS — R42 Dizziness and giddiness: Secondary | ICD-10-CM | POA: Diagnosis not present

## 2018-01-03 DIAGNOSIS — H6993 Unspecified Eustachian tube disorder, bilateral: Secondary | ICD-10-CM | POA: Insufficient documentation

## 2018-01-04 ENCOUNTER — Telehealth: Payer: Self-pay | Admitting: Internal Medicine

## 2018-01-04 NOTE — Telephone Encounter (Signed)
FYI: Pt wanted me to let you know he seen Dr. Janace Hoard at [facility] yesterday and there was absolutely no fluid in ear canal but a lot of wax which was flushed out. Also, his bp is much better, now that he is taking his bp meds the way he's supposed to .

## 2018-01-09 ENCOUNTER — Ambulatory Visit (HOSPITAL_COMMUNITY)
Admission: RE | Admit: 2018-01-09 | Discharge: 2018-01-09 | Disposition: A | Discharge status: Home / Self Care | Payer: Medicare Other | Source: Ambulatory Visit | Attending: Cardiology | Admitting: Cardiology

## 2018-01-09 DIAGNOSIS — R0989 Other specified symptoms and signs involving the circulatory and respiratory systems: Secondary | ICD-10-CM | POA: Diagnosis not present

## 2018-01-16 ENCOUNTER — Telehealth: Payer: Self-pay | Admitting: Internal Medicine

## 2018-01-16 NOTE — Telephone Encounter (Signed)
Copied from [place] [id number]. Topic: Quick Communication - Lab Results >> Jan 11, 2018  5:11 PM Cairrikier Dian Queen, CMA wrote: May give VAS [place] results. >> Jan 16, 2018 11:19 AM Judyann Munson wrote: Patient is calling to request a call a back in regards to his lab result.

## 2018-01-22 DIAGNOSIS — M461 Sacroiliitis, not elsewhere classified: Secondary | ICD-10-CM | POA: Diagnosis not present

## 2018-03-07 ENCOUNTER — Other Ambulatory Visit: Payer: Self-pay | Admitting: Internal Medicine

## 2018-03-07 DIAGNOSIS — I872 Venous insufficiency (chronic) (peripheral): Secondary | ICD-10-CM

## 2018-04-16 DIAGNOSIS — R21 Rash and other nonspecific skin eruption: Secondary | ICD-10-CM | POA: Diagnosis not present

## 2018-04-16 DIAGNOSIS — M461 Sacroiliitis, not elsewhere classified: Secondary | ICD-10-CM | POA: Diagnosis not present

## 2018-04-24 IMAGING — RF DG C-ARM 61-120 MIN
1 series · 3 of 3 positions shown · non-contrast
Comparison: Radiographs August 06, 2015.

CLINICAL DATA: Right-sided sacroiliac joint fusion.

EXAM:
BILATERAL SACROILIAC JOINTS - 3+ VIEW; DG C-ARM 61-120 MIN
FLUOROSCOPY TIME:  2 minutes 16 seconds.

[Series 1: run · 3 of 3 slices shown]
[im 1/3]
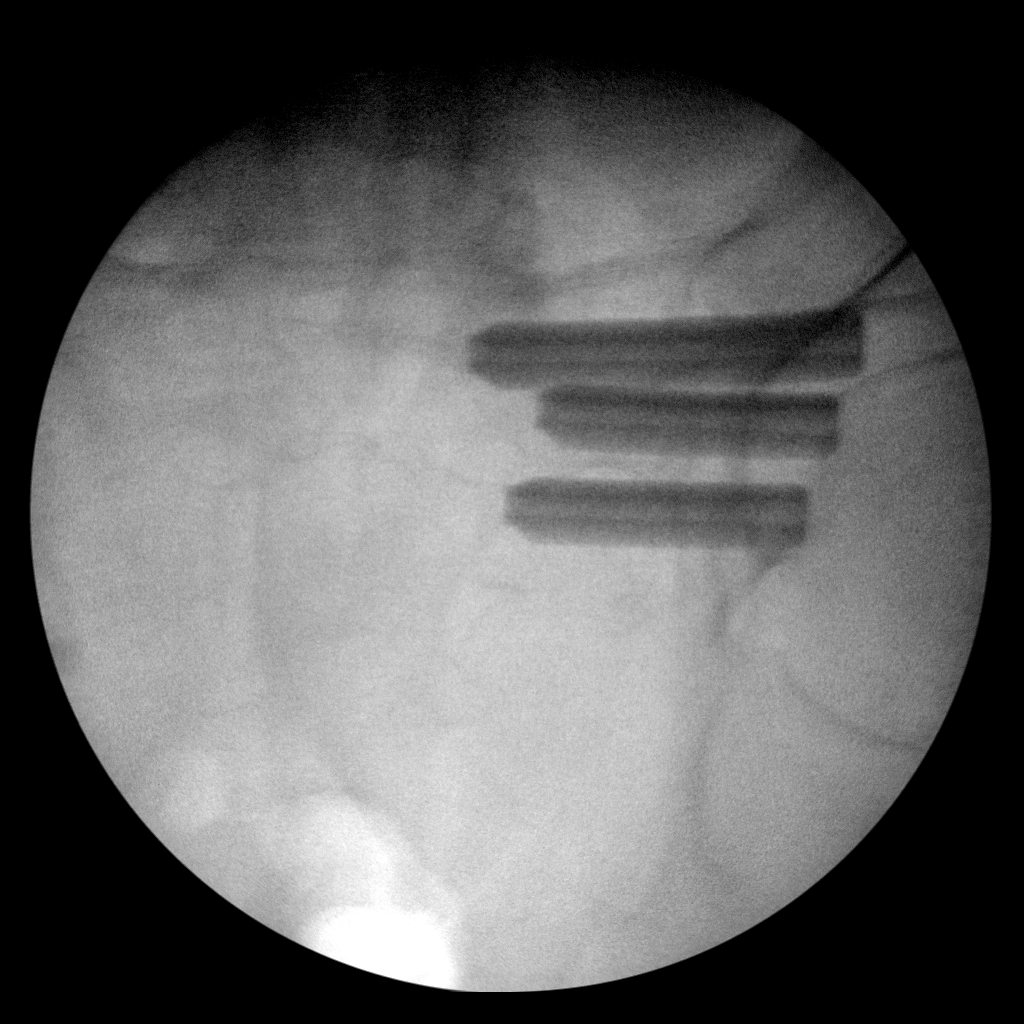
[im 2/3]
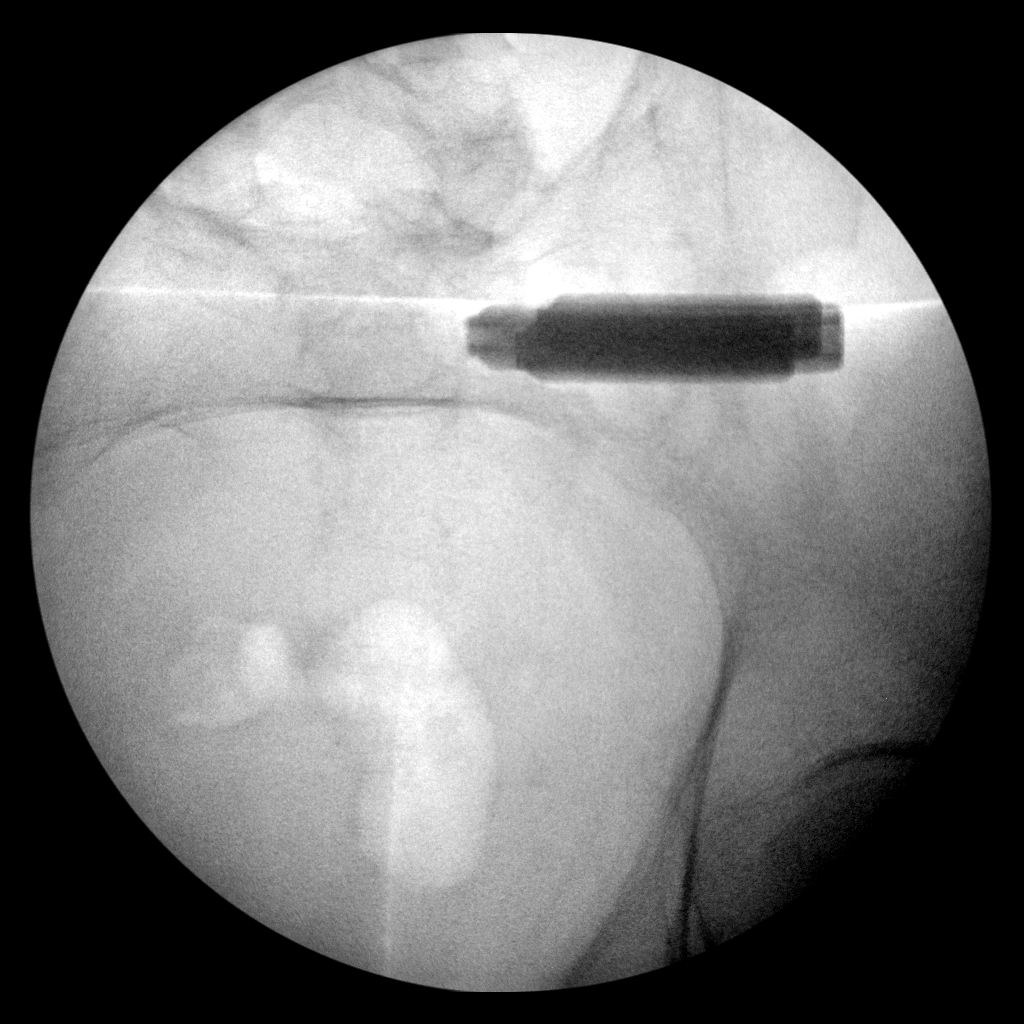
[im 3/3]
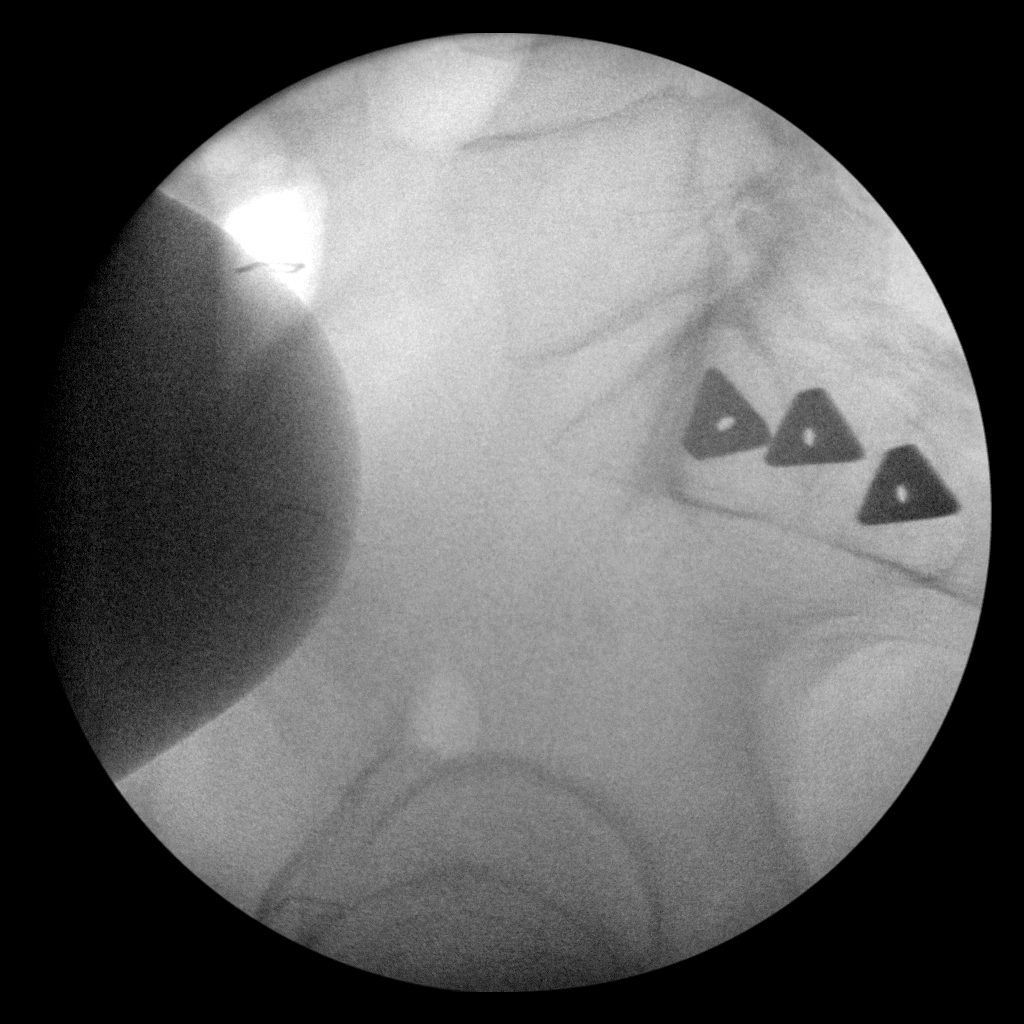

[3 of 3 positions shown; findings below may reference images not displayed]

FINDINGS: Three intraoperative fluoroscopic images of the pelvis were
obtained. These images demonstrate 3 large surgical screws passing
through the right sacroiliac joint for fusion purposes.
IMPRESSION: Status post surgical fusion of right sacroiliac joint.

## 2018-06-01 DIAGNOSIS — M722 Plantar fascial fibromatosis: Secondary | ICD-10-CM | POA: Diagnosis not present

## 2018-06-11 DIAGNOSIS — M722 Plantar fascial fibromatosis: Secondary | ICD-10-CM | POA: Diagnosis not present

## 2018-06-12 ENCOUNTER — Ambulatory Visit (INDEPENDENT_AMBULATORY_CARE_PROVIDER_SITE_OTHER): Payer: Medicare Other

## 2018-06-12 DIAGNOSIS — Z23 Encounter for immunization: Secondary | ICD-10-CM | POA: Diagnosis not present

## 2018-06-19 DIAGNOSIS — M722 Plantar fascial fibromatosis: Secondary | ICD-10-CM | POA: Diagnosis not present

## 2018-06-27 DIAGNOSIS — M722 Plantar fascial fibromatosis: Secondary | ICD-10-CM | POA: Diagnosis not present

## 2018-07-04 DIAGNOSIS — M722 Plantar fascial fibromatosis: Secondary | ICD-10-CM | POA: Diagnosis not present

## 2018-07-09 DIAGNOSIS — M461 Sacroiliitis, not elsewhere classified: Secondary | ICD-10-CM | POA: Diagnosis not present

## 2018-07-10 DIAGNOSIS — M722 Plantar fascial fibromatosis: Secondary | ICD-10-CM | POA: Diagnosis not present

## 2018-07-11 DIAGNOSIS — M722 Plantar fascial fibromatosis: Secondary | ICD-10-CM | POA: Diagnosis not present

## 2018-07-18 ENCOUNTER — Telehealth: Payer: Self-pay

## 2018-07-18 ENCOUNTER — Other Ambulatory Visit: Payer: Self-pay | Admitting: Internal Medicine

## 2018-07-18 DIAGNOSIS — I10 Essential (primary) hypertension: Secondary | ICD-10-CM

## 2018-07-18 DIAGNOSIS — M722 Plantar fascial fibromatosis: Secondary | ICD-10-CM | POA: Diagnosis not present

## 2018-07-18 LAB — PSA: PSA: 0.022

## 2018-07-18 MED ORDER — IRBESARTAN 150 MG PO TABS: 150.0000 mg | ORAL_TABLET | Freq: Every day | ORAL | 0 refills | Status: DC

## 2018-07-18 NOTE — Telephone Encounter (Signed)
Called pt and confirmed reason rx was denied. Pt has an appt on Monday. erx for 30 day supply has been sent.

## 2018-07-23 ENCOUNTER — Ambulatory Visit (INDEPENDENT_AMBULATORY_CARE_PROVIDER_SITE_OTHER): Payer: Medicare Other | Admitting: Internal Medicine

## 2018-07-23 ENCOUNTER — Other Ambulatory Visit: Payer: Self-pay

## 2018-07-23 ENCOUNTER — Encounter: Payer: Self-pay | Admitting: Internal Medicine

## 2018-07-23 ENCOUNTER — Other Ambulatory Visit (INDEPENDENT_AMBULATORY_CARE_PROVIDER_SITE_OTHER): Payer: Medicare Other

## 2018-07-23 VITALS — BP 160/96 | HR 60 | Temp 98.3°F | Resp 16 | Ht 76.0 in | Wt 223.2 lb

## 2018-07-23 DIAGNOSIS — E7800 Pure hypercholesterolemia, unspecified: Secondary | ICD-10-CM | POA: Diagnosis not present

## 2018-07-23 DIAGNOSIS — D539 Nutritional anemia, unspecified: Secondary | ICD-10-CM | POA: Diagnosis not present

## 2018-07-23 DIAGNOSIS — I10 Essential (primary) hypertension: Secondary | ICD-10-CM | POA: Diagnosis not present

## 2018-07-23 DIAGNOSIS — Z1159 Encounter for screening for other viral diseases: Secondary | ICD-10-CM | POA: Diagnosis not present

## 2018-07-23 DIAGNOSIS — Z23 Encounter for immunization: Secondary | ICD-10-CM

## 2018-07-23 DIAGNOSIS — E785 Hyperlipidemia, unspecified: Secondary | ICD-10-CM | POA: Diagnosis not present

## 2018-07-23 LAB — CBC WITH DIFFERENTIAL/PLATELET
Basophils Absolute: 0 10*3/uL (ref 0.0–0.1)
Basophils Relative: 0.3 % (ref 0.0–3.0)
Eosinophils Absolute: 0.1 10*3/uL (ref 0.0–0.7)
Eosinophils Relative: 1.3 % (ref 0.0–5.0)
HCT: 39.3 % (ref 39.0–52.0)
Hemoglobin: 13.3 g/dL (ref 13.0–17.0)
Lymphocytes Relative: 16.7 % (ref 12.0–46.0)
Lymphs Abs: 1.3 10*3/uL (ref 0.7–4.0)
MCHC: 33.8 g/dL (ref 30.0–36.0)
MCV: 91.3 fl (ref 78.0–100.0)
Monocytes Absolute: 0.7 10*3/uL (ref 0.1–1.0)
Monocytes Relative: 8.6 % (ref 3.0–12.0)
Neutro Abs: 5.5 10*3/uL (ref 1.4–7.7)
Neutrophils Relative %: 73.1 % (ref 43.0–77.0)
PLATELETS: 205 10*3/uL (ref 150.0–400.0)
RBC: 4.3 Mil/uL (ref 4.22–5.81)
RDW: 13.4 % (ref 11.5–15.5)
WBC: 7.6 10*3/uL (ref 4.0–10.5)

## 2018-07-23 LAB — LIPID PANEL
CHOLESTEROL: 153 mg/dL (ref 0–200)
HDL: 42.6 mg/dL (ref >39.00)
LDL Cholesterol: 94 mg/dL (ref 0–99)
NonHDL: 110.34
Total CHOL/HDL Ratio: 4
Triglycerides: 81 mg/dL (ref 0.0–149.0)
VLDL: 16.2 mg/dL (ref 0.0–40.0)

## 2018-07-23 LAB — FERRITIN: Ferritin: 110 ng/mL (ref 22.0–322.0)

## 2018-07-23 LAB — BASIC METABOLIC PANEL
BUN: 23 mg/dL (ref 6–23)
CO2: 28 mEq/L (ref 19–32)
Calcium: 9.6 mg/dL (ref 8.4–10.5)
Chloride: 103 mEq/L (ref 96–112)
Creatinine, Ser: 1.09 mg/dL (ref 0.40–1.50)
GFR: 67.47 mL/min (ref >60.00)
Glucose, Bld: 105 mg/dL — ABNORMAL HIGH (ref 70–99)
Potassium: 4.5 mEq/L (ref 3.5–5.1)
Sodium: 138 mEq/L (ref 135–145)

## 2018-07-23 LAB — IBC PANEL
Iron: 110 ug/dL (ref 42–165)
SATURATION RATIOS: 30.1 % (ref 20.0–50.0)
Transferrin: 261 mg/dL (ref 212.0–360.0)

## 2018-07-23 LAB — FOLATE: Folate: >24 ng/mL (ref >5.9)

## 2018-07-23 MED ORDER — ROSUVASTATIN CALCIUM 5 MG PO TABS: 5.0000 mg | ORAL_TABLET | Freq: Every day | ORAL | 1 refills | Status: DC

## 2018-07-23 MED ORDER — IRBESARTAN-HYDROCHLOROTHIAZIDE 300-12.5 MG PO TABS: 1.0000 | ORAL_TABLET | Freq: Every day | ORAL | 1 refills | Status: DC

## 2018-07-23 NOTE — Patient Instructions (Signed)

## 2018-07-23 NOTE — Progress Notes (Signed)
Subjective:  Patient ID: Christian Ward, male    DOB: [birth year]  Age: 68 y.o. MRN: [id number]  CC: Hypertension and Anemia   HPI Christian Ward presents for a BP check and anemia f/up - He is concerned that his blood pressure is not adequately well controlled but he has felt well recently and offers no complaints.  Emotionally he is doing much better and has decided not to take an antidepressant.  He is also not taking the statin.  It did not cause any side effects, he is just apathetic about taking another medication.  Outpatient Medications Prior to Visit  Medication Sig Dispense Refill   acetaminophen (TYLENOL) 500 MG tablet Take 500 mg by mouth every 6 (six) hours as needed for mild pain.     CELEBREX 200 MG capsule Take 200 mg by mouth daily.  3   Cholecalciferol (VITAMIN D3) 100000 UNIT/GM POWD Take by mouth.     clonazePAM (KLONOPIN) 0.5 MG tablet Take 1 tablet (0.5 mg total) by mouth 2 (two) times daily as needed for anxiety. 60 tablet 3   Dietary Management Product (VASCULERA) TABS TAKE ONE TABLET BY MOUTH EVERY DAY 30 tablet 11   famotidine (PEPCID) 20 MG tablet Take 1 tablet (20 mg total) by mouth 2 (two) times daily. 10 tablet 0   fluticasone (FLONASE) 50 MCG/ACT nasal spray Place 1 spray into both nostrils daily as needed for allergies.      Magnesium 250 MG TABS Take 250 mg by mouth daily.     omeprazole (PRILOSEC) 20 MG capsule Take 20 mg by mouth 2 (two) times daily as needed (acid reflux). Takes 1 daily and may take a second dose if needed     Probiotic Product (PROBIOTIC DAILY) CAPS Take 1 capsule by mouth daily.     vitamin C (ASCORBIC ACID) 500 MG tablet Take 500 mg by mouth daily.     irbesartan (AVAPRO) 150 MG tablet Take 1 tablet (150 mg total) by mouth daily. 30 tablet 0   rosuvastatin (CRESTOR) 5 MG tablet Take 1 tablet (5 mg total) by mouth daily. 90 tablet 1   Vilazodone HCl (VIIBRYD STARTER PACK) 10 & 20 MG KIT Take 1 tablet by mouth daily. 1  kit 0   No facility-administered medications prior to visit.     ROS Review of Systems  Constitutional: Negative for chills, fatigue and fever.  HENT: Negative.   Eyes: Negative for visual disturbance.  Respiratory: Negative for cough, chest tightness, shortness of breath and wheezing.   Cardiovascular: Negative for chest pain and leg swelling.  Gastrointestinal: Negative for abdominal pain, constipation, diarrhea, nausea and vomiting.  Endocrine: Negative.   Genitourinary: Negative.  Negative for difficulty urinating.  Musculoskeletal: Negative.  Negative for arthralgias and myalgias.  Skin: Negative.   Neurological: Negative for dizziness and weakness.  Hematological: Negative for adenopathy. Does not bruise/bleed easily.  Psychiatric/Behavioral: Negative.     Objective:  BP (!) 160/96 (BP Location: Left Arm, Patient Position: Sitting, Cuff Size: Normal)    Pulse 60    Temp 98.3 F (36.8 C) (Oral)    Resp 16    Ht '6\' 4"'$  (1.93 m)    Wt 223 lb 4 oz (101.3 kg)    SpO2 99%    BMI 27.17 kg/m   BP Readings from Last 3 Encounters:  07/23/18 (!) 160/96  12/27/17 (!) 162/94  11/27/17 (!) 148/80    Wt Readings from Last 3 Encounters:  07/23/18 223 lb 4  oz (101.3 kg)  12/27/17 220 lb 12 oz (100.1 kg)  11/27/17 219 lb 8 oz (99.6 kg)    Physical Exam Vitals signs reviewed.  Constitutional:      Appearance: He is obese. He is not ill-appearing or diaphoretic.  HENT:     Nose: Nose normal.     Mouth/Throat:     Mouth: Mucous membranes are moist.     Pharynx: Oropharynx is clear. No oropharyngeal exudate or posterior oropharyngeal erythema.  Eyes:     General: No scleral icterus.    Conjunctiva/sclera: Conjunctivae normal.  Neck:     Musculoskeletal: Normal range of motion and neck supple. No muscular tenderness.  Cardiovascular:     Rate and Rhythm: Normal rate and regular rhythm.     Heart sounds: No murmur.  Pulmonary:     Effort: Pulmonary effort is normal.     Breath  sounds: No wheezing, rhonchi or rales.  Abdominal:     General: Abdomen is flat. Bowel sounds are normal.     Palpations: There is no hepatomegaly, splenomegaly or mass.     Tenderness: There is no abdominal tenderness.  Musculoskeletal: Normal range of motion.        General: No swelling.     Right lower leg: No edema.     Left lower leg: No edema.  Skin:    General: Skin is warm and dry.  Neurological:     General: No focal deficit present.     Mental Status: He is oriented to person, place, and time. Mental status is at baseline.  Psychiatric:        Mood and Affect: Mood normal.        Behavior: Behavior normal.     Lab Results  Component Value Date   WBC 7.6 07/23/2018   HGB 13.3 07/23/2018   HCT 39.3 07/23/2018   PLT 205.0 07/23/2018   GLUCOSE 105 (H) 07/23/2018   CHOL 153 07/23/2018   TRIG 81.0 07/23/2018   HDL 42.60 07/23/2018   LDLCALC 94 07/23/2018   ALT 17 11/27/2017   AST 18 11/27/2017   NA 138 07/23/2018   K 4.5 07/23/2018   CL 103 07/23/2018   CREATININE 1.09 07/23/2018   BUN 23 07/23/2018   CO2 28 07/23/2018   TSH 1.59 11/27/2017   PSA 0.33 10/16/2017   INR 1.06 08/31/2016    Vas US Carotid  Result Date: 01/09/2018 Carotid Arterial Duplex Study Indications:  The patient has concerns with 5 weeks of vertigo and a "crackling"               in his left ear. He reported 2 episodes of more intense vertigo               and noted that he has a tendency to veer to the left when walking. Risk Factors: Hypertension, past history of smoking. Performing Technologist: Wilkie Aye RVT  Examination Guidelines: A complete evaluation includes B-mode imaging, spectral Doppler, color Doppler, and power Doppler as needed of all accessible portions of each vessel. Bilateral testing is considered an integral part of a complete examination. Limited examinations for reoccurring indications may be performed as noted.  Right Carotid Findings:  +----------+--------+--------+--------+--------+--------+             PSV cm/s EDV cm/s Stenosis Describe Comments  +----------+--------+--------+--------+--------+--------+  CCA Prox   99       23                                   +----------+--------+--------+--------+--------+--------+  CCA Distal 62       17                                   +----------+--------+--------+--------+--------+--------+  ICA Prox   70       25       Normal                      +----------+--------+--------+--------+--------+--------+  ICA Mid    68       25                                   +----------+--------+--------+--------+--------+--------+  ICA Distal 75       30                                   +----------+--------+--------+--------+--------+--------+  ECA        92       18                                   +----------+--------+--------+--------+--------+--------+ +----------+--------+-------+----------------+-------------------+             PSV cm/s EDV cms Describe         Arm Pressure (mmHG)  +----------+--------+-------+----------------+-------------------+  Subclavian 130              Multiphasic, WNL 150                  +----------+--------+-------+----------------+-------------------+ +---------+--------+--+--------+--+---------+  Vertebral PSV cm/s 46 EDV cm/s 16 Antegrade  +---------+--------+--+--------+--+---------+  Left Carotid Findings: +----------+--------+--------+--------+--------+--------+             PSV cm/s EDV cm/s Stenosis Describe Comments  +----------+--------+--------+--------+--------+--------+  CCA Prox   117      17                                   +----------+--------+--------+--------+--------+--------+  CCA Distal 51       15                                   +----------+--------+--------+--------+--------+--------+  ICA Prox   72       22       Normal                      +----------+--------+--------+--------+--------+--------+  ICA Mid    76       27                                    +----------+--------+--------+--------+--------+--------+  ICA Distal 80       29                                   +----------+--------+--------+--------+--------+--------+  ECA        74       12                                   +----------+--------+--------+--------+--------+--------+ +----------+--------+--------+----------------+-------------------+  Subclavian PSV cm/s EDV cm/s Describe         Arm Pressure (mmHG)  +----------+--------+--------+----------------+-------------------+             149               Multiphasic, WNL 150                  +----------+--------+--------+----------------+-------------------+ +---------+--------+--+--------+-+---------+  Vertebral PSV cm/s 45 EDV cm/s 6 Antegrade  +---------+--------+--+--------+-+---------+  Final Interpretation: Right Carotid: There was no evidence of thrombus, dissection, atherosclerotic                plaque or stenosis in the cervical carotid system. Left Carotid: There was no evidence of thrombus, dissection, atherosclerotic               plaque or stenosis in the cervical carotid system. Vertebrals:  Bilateral vertebral arteries demonstrate antegrade flow. Subclavians: Normal flow hemodynamics were seen in bilateral subclavian              arteries. *See table(s) above for measurements and observations.  Electronically signed by Jenkins Rouge MD on 01/09/2018 at 9:15:29 PM.    Final     Assessment & Plan:   Obie was seen today for hypertension and anemia.  Diagnoses and all orders for this visit:  Need for hepatitis C screening test -     Hepatitis C antibody; Future  Essential hypertension- His blood pressure is not adequately well controlled so I have asked him to increase the dose of the ARB and to add a thiazide diuretic. -     Basic metabolic panel; Future -     irbesartan-hydrochlorothiazide (AVALIDE) 300-12.5 MG tablet; Take 1 tablet by mouth daily.  Dyslipidemia, goal LDL below 100- His ASCVD risk or is above 15% so I have  asked him to restart the statin for CV risk reduction. -     Lipid panel; Future  Deficiency anemia- His H&H are normal now.  I will screen him for vitamin deficiencies. -     CBC with Differential/Platelet; Future -     IBC panel; Future -     Folate; Future -     Ferritin; Future -     Vitamin B1; Future -     Vitamin B12; Future  Need for Tdap vaccination -     Tdap vaccine greater than or equal to 7yo IM  Need for pneumococcal vaccination -     Pneumococcal polysaccharide vaccine 23-valent greater than or equal to 2yo subcutaneous/IM  HYPERCHOLESTEROLEMIA, MILD -     rosuvastatin (CRESTOR) 5 MG tablet; Take 1 tablet (5 mg total) by mouth daily.   I have discontinued Gurney Maxin Kuan's Vilazodone HCl and irbesartan. I am also having him start on irbesartan-hydrochlorothiazide. Additionally, I am having him maintain his omeprazole, Probiotic Daily, fluticasone, Magnesium, acetaminophen, CeleBREX, famotidine, vitamin C, Vitamin D3, clonazePAM, Vasculera, and rosuvastatin.  Meds ordered this encounter  Medications   rosuvastatin (CRESTOR) 5 MG tablet    Sig: Take 1 tablet (5 mg total) by mouth daily.    Dispense:  90 tablet    Refill:  1   irbesartan-hydrochlorothiazide (AVALIDE) 300-12.5 MG tablet    Sig: Take 1 tablet by mouth daily.    Dispense:  90 tablet    Refill:  1     Follow-up: Return in about 4 months (around 11/22/2018).  Scarlette Calico, MD

## 2018-07-25 DIAGNOSIS — M722 Plantar fascial fibromatosis: Secondary | ICD-10-CM | POA: Diagnosis not present

## 2018-07-27 ENCOUNTER — Other Ambulatory Visit: Payer: Self-pay | Admitting: Internal Medicine

## 2018-07-27 LAB — VITAMIN B1: VITAMIN B1 (THIAMINE): 40 nmol/L — AB (ref 8–30)

## 2018-07-27 LAB — HEPATITIS C ANTIBODY
Hepatitis C Ab: NONREACTIVE
SIGNAL TO CUT-OFF: 0.02 (ref <1.00)

## 2018-08-01 DIAGNOSIS — M722 Plantar fascial fibromatosis: Secondary | ICD-10-CM | POA: Diagnosis not present

## 2018-08-06 ENCOUNTER — Telehealth: Payer: Self-pay

## 2018-08-06 NOTE — Telephone Encounter (Signed)
Pt sent an email requesting a call regarding appointment a few weeks ago.

## 2018-08-06 NOTE — Telephone Encounter (Signed)
Pt returned call from [place].   CB: [phone]

## 2018-08-06 NOTE — Telephone Encounter (Signed)
Pt is concerned about the irbesartan HCTZ strength. Pt would like to start at the irbesartan 150mg  and HCTZ 12.5 first and try it for 30 days and then come in and recheck BP before he completes new script.   Please advise. Pt has not started the irbesartan HCTZ 300-12.5 mg yet.

## 2018-08-06 NOTE — Telephone Encounter (Signed)
LVM for pt to call back as soon as possible.   

## 2018-08-07 ENCOUNTER — Other Ambulatory Visit: Payer: Self-pay | Admitting: Internal Medicine

## 2018-08-07 DIAGNOSIS — I10 Essential (primary) hypertension: Secondary | ICD-10-CM

## 2018-08-07 DIAGNOSIS — M722 Plantar fascial fibromatosis: Secondary | ICD-10-CM | POA: Diagnosis not present

## 2018-08-07 MED ORDER — IRBESARTAN-HYDROCHLOROTHIAZIDE 150-12.5 MG PO TABS: 1.0000 | ORAL_TABLET | Freq: Every day | ORAL | 1 refills | Status: DC

## 2018-08-07 NOTE — Telephone Encounter (Signed)
New RX sent

## 2018-08-07 NOTE — Telephone Encounter (Signed)
Left detailed message informing pt of below.  

## 2018-08-08 DIAGNOSIS — H527 Unspecified disorder of refraction: Secondary | ICD-10-CM | POA: Diagnosis not present

## 2018-08-15 DIAGNOSIS — L84 Corns and callosities: Secondary | ICD-10-CM | POA: Diagnosis not present

## 2018-08-15 DIAGNOSIS — L603 Nail dystrophy: Secondary | ICD-10-CM | POA: Diagnosis not present

## 2018-08-21 ENCOUNTER — Telehealth: Payer: Self-pay | Admitting: Internal Medicine

## 2018-08-21 NOTE — Telephone Encounter (Signed)
Pt would like you to give him a call regarding some side effects he's having with the new bp medication. Call back # [phone]  **He would like to only talk with stefannie due to she knows the situation best. He is aware she is off on Tuesday and will call him on Wednesday.   thanks

## 2018-08-22 ENCOUNTER — Other Ambulatory Visit: Payer: Self-pay | Admitting: Internal Medicine

## 2018-08-22 DIAGNOSIS — I10 Essential (primary) hypertension: Secondary | ICD-10-CM

## 2018-08-22 DIAGNOSIS — M722 Plantar fascial fibromatosis: Secondary | ICD-10-CM | POA: Diagnosis not present

## 2018-08-22 MED ORDER — IRBESARTAN 150 MG PO TABS: 150.0000 mg | ORAL_TABLET | Freq: Every day | ORAL | 1 refills | Status: DC

## 2018-08-22 MED ORDER — HYDROCHLOROTHIAZIDE 12.5 MG PO CAPS: 12.5000 mg | ORAL_CAPSULE | Freq: Every day | ORAL | 1 refills | Status: DC

## 2018-08-22 NOTE — Telephone Encounter (Signed)
Contacted pt, he stated that he is having cramps all over.   Pt stated that last Saturday he stopped taking the irbesartan/HCTZ. He does not have the cramps or excessive urination. Pt states that the last 2 BP readings were 127/78 and 120/65 (without the diuretic) taking only the irbesartan 150mg .  Pt is requesting the diuretic rx'ed seperately. Please advise.

## 2018-08-22 NOTE — Telephone Encounter (Signed)
Detailed message left for pt informing rx has been sent.

## 2018-08-22 NOTE — Telephone Encounter (Signed)
New RX's sent to [facility]

## 2018-09-13 ENCOUNTER — Telehealth: Payer: Self-pay | Admitting: Internal Medicine

## 2018-09-13 NOTE — Telephone Encounter (Signed)
Can you please call pt at [phone]. He would like to discuss the 2 shots you gave him a couple weeks ago.

## 2018-09-17 NOTE — Telephone Encounter (Signed)
lvm for pt to call back.  

## 2018-09-19 NOTE — Telephone Encounter (Signed)
Pt contacted and pt wanted to ask about his bill. He stated that he received a bill for Hep C vaccine. I informed pt that he received the tdap and pneumovax on 03/16, but that he did have a blood test to look for the abs for Hep C. Pt stated understanding. I offered pt additional assistance if he finds that he has more questions about his bill.

## 2018-09-21 ENCOUNTER — Ambulatory Visit: Payer: Self-pay | Admitting: Podiatry

## 2018-09-24 ENCOUNTER — Other Ambulatory Visit: Payer: Self-pay

## 2018-09-24 ENCOUNTER — Ambulatory Visit (INDEPENDENT_AMBULATORY_CARE_PROVIDER_SITE_OTHER): Payer: Medicare Other

## 2018-09-24 ENCOUNTER — Encounter: Payer: Self-pay | Admitting: Podiatry

## 2018-09-24 ENCOUNTER — Other Ambulatory Visit: Payer: Self-pay | Admitting: Podiatry

## 2018-09-24 ENCOUNTER — Ambulatory Visit: Payer: Medicare Other | Admitting: Podiatry

## 2018-09-24 VITALS — BP 162/91 | HR 59 | Temp 97.7°F

## 2018-09-24 DIAGNOSIS — M779 Enthesopathy, unspecified: Secondary | ICD-10-CM

## 2018-09-24 DIAGNOSIS — L84 Corns and callosities: Secondary | ICD-10-CM

## 2018-09-24 DIAGNOSIS — M79671 Pain in right foot: Secondary | ICD-10-CM

## 2018-09-24 DIAGNOSIS — M79672 Pain in left foot: Secondary | ICD-10-CM

## 2018-09-24 DIAGNOSIS — L600 Ingrowing nail: Secondary | ICD-10-CM

## 2018-09-24 MED ORDER — NEOMYCIN-POLYMYXIN-HC 3.5-10000-1 OT SOLN: OTIC | 0 refills | Status: DC

## 2018-09-24 MED ORDER — TRIAMCINOLONE ACETONIDE 10 MG/ML IJ SUSP: 10.0000 mg | Freq: Once | INTRAMUSCULAR | Status: DC

## 2018-09-24 NOTE — Patient Instructions (Signed)

## 2018-09-26 NOTE — Progress Notes (Signed)
Subjective:   Patient ID: Christian Ward, male   DOB: 68 y.o.   MRN: [id number]   HPI Patient presents with several problems of which one is been chronic heel pain that is somewhat better now along with ingrown toenail of the left big toe and a lot of pain and inflammation around the outside of the left foot.  States this is been going on for at least 6 months and the ingrown is been several months and he is tried to soak it without relief.  Patient does not smoke and likes to be active   Review of Systems  All other systems reviewed and are negative.       Objective:  Physical Exam Vitals signs and nursing note reviewed.  Constitutional:      Appearance: He is well-developed.  Pulmonary:     Effort: Pulmonary effort is normal.  Musculoskeletal: Normal range of motion.  Skin:    General: Skin is warm.  Neurological:     Mental Status: He is alert.     Neurovascular status found to be intact muscle strength is adequate range of motion within normal limits.  Patient is noted to have an incurvated left hallux medial border that is painful when pressed and also inflammation pain around the fifth metatarsal head left with fluid buildup and keratotic plantar lesion formation along with mild heel pain left foot.  Patient has good digital perfusion well oriented x3     Assessment:  Ingrown toenail deformity left hallux along with inflammatory capsulitis fifth MPJ left with keratotic lesion formation and mild plantar fasciitis left     Plan:  H&P conditions reviewed and discussed.  I recommended removal of the nail corner and patient wants procedure and I went ahead today infiltrated 60 mg Xylocaine Marcaine mixture sterile prep applied to the toe and using sterile instrumentation I remove the nail border exposed matrix and applied phenol 3 applications 30 seconds followed by alcohol lavage and sterile dressing.  I injected the outside of the fifth MPJ with 2 mg Dexasone Kenalog 5 mg  Xylocaine debrided plantar lesion left and reappoint for [place] to recheck again in the next several weeks  X-ray indicates that there is some mild plantar spurring formation no indications of stress fracture arthritis

## 2018-10-02 DIAGNOSIS — M461 Sacroiliitis, not elsewhere classified: Secondary | ICD-10-CM | POA: Diagnosis not present

## 2018-10-03 ENCOUNTER — Telehealth: Payer: Self-pay | Admitting: *Deleted

## 2018-10-03 NOTE — Telephone Encounter (Signed)
Pt had called concerning wife's toenail procedure and stated he had a toenail procedure and would send pictures tomorrow.

## 2018-10-05 NOTE — Telephone Encounter (Signed)
Pt emailed photos of toes after toenail procedure. DOB 10/21/1950 Christian Ward, please, like yesterday, check out my toe and tell me if I need to keep soaking and using band aid. Please call me at [phone] Thank you.

## 2018-10-05 NOTE — Telephone Encounter (Signed)
Left message informing pt I reviewed the photos of the left 1st toenail procedure that it did look good at this time and to continue the soaks twice daily with the drops after each soak for 2-3 weeks and at about the end of the 2nd week could go to once daily soaks and continue the drops once daily and allow to air dry when resting, and to call with any concerns.

## 2018-10-25 ENCOUNTER — Ambulatory Visit: Payer: Medicare Other | Admitting: Internal Medicine

## 2018-11-02 ENCOUNTER — Other Ambulatory Visit: Payer: Self-pay

## 2018-11-02 ENCOUNTER — Ambulatory Visit: Payer: Medicare Other | Admitting: Podiatry

## 2018-11-02 VITALS — Temp 98.3°F

## 2018-11-02 DIAGNOSIS — M21622 Bunionette of left foot: Secondary | ICD-10-CM

## 2018-11-02 DIAGNOSIS — M722 Plantar fascial fibromatosis: Secondary | ICD-10-CM | POA: Diagnosis not present

## 2018-11-02 DIAGNOSIS — L84 Corns and callosities: Secondary | ICD-10-CM | POA: Diagnosis not present

## 2018-11-06 ENCOUNTER — Other Ambulatory Visit (INDEPENDENT_AMBULATORY_CARE_PROVIDER_SITE_OTHER): Payer: Medicare Other

## 2018-11-06 ENCOUNTER — Encounter: Payer: Self-pay | Admitting: Internal Medicine

## 2018-11-06 ENCOUNTER — Ambulatory Visit (INDEPENDENT_AMBULATORY_CARE_PROVIDER_SITE_OTHER): Payer: Medicare Other | Admitting: Internal Medicine

## 2018-11-06 ENCOUNTER — Other Ambulatory Visit: Payer: Self-pay

## 2018-11-06 VITALS — BP 148/84 | HR 60 | Temp 98.0°F | Resp 16 | Ht 76.0 in | Wt 221.0 lb

## 2018-11-06 DIAGNOSIS — E785 Hyperlipidemia, unspecified: Secondary | ICD-10-CM

## 2018-11-06 DIAGNOSIS — K5900 Constipation, unspecified: Secondary | ICD-10-CM | POA: Diagnosis not present

## 2018-11-06 DIAGNOSIS — Z0000 Encounter for general adult medical examination without abnormal findings: Secondary | ICD-10-CM | POA: Diagnosis not present

## 2018-11-06 DIAGNOSIS — D539 Nutritional anemia, unspecified: Secondary | ICD-10-CM

## 2018-11-06 DIAGNOSIS — I10 Essential (primary) hypertension: Secondary | ICD-10-CM

## 2018-11-06 LAB — BASIC METABOLIC PANEL
BUN: 20 mg/dL (ref 6–23)
CO2: 27 mEq/L (ref 19–32)
Calcium: 8.9 mg/dL (ref 8.4–10.5)
Chloride: 105 mEq/L (ref 96–112)
Creatinine, Ser: 1.03 mg/dL (ref 0.40–1.50)
GFR: 71.96 mL/min (ref >60.00)
Glucose, Bld: 94 mg/dL (ref 70–99)
Potassium: 3.9 mEq/L (ref 3.5–5.1)
Sodium: 140 mEq/L (ref 135–145)

## 2018-11-06 LAB — CBC WITH DIFFERENTIAL/PLATELET
Basophils Absolute: 0 10*3/uL (ref 0.0–0.1)
Basophils Relative: 0.4 % (ref 0.0–3.0)
Eosinophils Absolute: 0.1 10*3/uL (ref 0.0–0.7)
Eosinophils Relative: 1.8 % (ref 0.0–5.0)
HCT: 36.3 % — ABNORMAL LOW (ref 39.0–52.0)
Hemoglobin: 12.3 g/dL — ABNORMAL LOW (ref 13.0–17.0)
Lymphocytes Relative: 22.2 % (ref 12.0–46.0)
Lymphs Abs: 1.2 10*3/uL (ref 0.7–4.0)
MCHC: 33.9 g/dL (ref 30.0–36.0)
MCV: 91.8 fl (ref 78.0–100.0)
Monocytes Absolute: 0.4 10*3/uL (ref 0.1–1.0)
Monocytes Relative: 7.7 % (ref 3.0–12.0)
Neutro Abs: 3.6 10*3/uL (ref 1.4–7.7)
Neutrophils Relative %: 67.9 % (ref 43.0–77.0)
Platelets: 181 10*3/uL (ref 150.0–400.0)
RBC: 3.95 Mil/uL — ABNORMAL LOW (ref 4.22–5.81)
RDW: 13 % (ref 11.5–15.5)
WBC: 5.3 10*3/uL (ref 4.0–10.5)

## 2018-11-06 LAB — VITAMIN B12: Vitamin B-12: 324 pg/mL (ref 211–911)

## 2018-11-06 LAB — TSH: TSH: 1.22 u[IU]/mL (ref 0.35–4.50)

## 2018-11-06 NOTE — Progress Notes (Signed)
Subjective:  Patient ID: Christian Ward, male    DOB: 1951/04/18  Age: 68 y.o. MRN: [id number]  CC: Annual Exam and Hypertension   HPI Christian Ward presents for a CPX.  He tells me his blood pressure has been well controlled at home.  His systolic is usually around 250-037 and his diastolic is about 04-88.  He has chronic low back pain and is taking Celebrex under the direction of an orthopedist.  He has mild intermittent constipation that is well controlled with MiraLAX.  Outpatient Medications Prior to Visit  Medication Sig Dispense Refill  . acetaminophen (TYLENOL) 500 MG tablet Take 500 mg by mouth every 6 (six) hours as needed for mild pain.    [name] CELEBREX 200 MG capsule Take 200 mg by mouth daily.  3  . Cholecalciferol (VITAMIN D3) 100000 UNIT/GM POWD Take by mouth.    . clonazePAM (KLONOPIN) 0.5 MG tablet Take 1 tablet (0.5 mg total) by mouth 2 (two) times daily as needed for anxiety. 60 tablet 3  . Dietary Management Product (VASCULERA) TABS TAKE ONE TABLET BY MOUTH EVERY DAY 30 tablet 11  . famotidine (PEPCID) 20 MG tablet Take 1 tablet (20 mg total) by mouth 2 (two) times daily. 10 tablet 0  . fluticasone (FLONASE) 50 MCG/ACT nasal spray Place 1 spray into both nostrils daily as needed for allergies.     . hydrochlorothiazide (MICROZIDE) 12.5 MG capsule Take 1 capsule (12.5 mg total) by mouth daily. 90 capsule 1  . irbesartan (AVAPRO) 150 MG tablet Take 1 tablet (150 mg total) by mouth daily. 90 tablet 1  . Magnesium 250 MG TABS Take 250 mg by mouth daily.    [name] omeprazole (PRILOSEC) 20 MG capsule Take 20 mg by mouth 2 (two) times daily as needed (acid reflux). Takes 1 daily and may take a second dose if needed    . Probiotic Product (PROBIOTIC DAILY) CAPS Take 1 capsule by mouth daily.    . promethazine (PHENERGAN) 25 MG tablet Take by mouth.    . rosuvastatin (CRESTOR) 5 MG tablet Take 1 tablet (5 mg total) by mouth daily. 90 tablet 1  . neomycin-polymyxin-hydrocortisone  (CORTISPORIN) OTIC solution Apply 1 to 2 drops to toe BID after soaking 10 mL 0  . vitamin C (ASCORBIC ACID) 500 MG tablet Take 500 mg by mouth daily.    [name] triamcinolone acetonide (KENALOG) 10 MG/ML injection 10 mg      No facility-administered medications prior to visit.     ROS Review of Systems  Constitutional: Negative.  Negative for diaphoresis, fatigue and fever.  HENT: Negative.   Eyes: Negative for visual disturbance.  Respiratory: Negative for cough, chest tightness, shortness of breath and wheezing.   Gastrointestinal: Positive for constipation. Negative for abdominal pain, blood in stool and nausea.  Endocrine: Negative.   Genitourinary: Negative.  Negative for difficulty urinating and dysuria.  Musculoskeletal: Positive for back pain. Negative for arthralgias, myalgias and neck pain.  Skin: Negative.  Negative for color change and pallor.  Neurological: Negative.  Negative for dizziness, weakness, light-headedness and numbness.  Hematological: Negative for adenopathy. Does not bruise/bleed easily.  Psychiatric/Behavioral: Negative.     Objective:  BP (!) 148/84 (BP Location: Left Arm, Patient Position: Sitting, Cuff Size: Normal)   Pulse 60   Temp 98 F (36.7 C) (Oral)   Resp 16   Ht 6\' 4"  (1.93 m)   Wt 221 lb (100.2 kg)   SpO2 99%   BMI 26.90  kg/m   BP Readings from Last 3 Encounters:  11/06/18 (!) 148/84  09/24/18 (!) 162/91  07/23/18 (!) 160/96    Wt Readings from Last 3 Encounters:  11/06/18 221 lb (100.2 kg)  07/23/18 223 lb 4 oz (101.3 kg)  12/27/17 220 lb 12 oz (100.1 kg)    Physical Exam Vitals signs reviewed.  Constitutional:      Appearance: He is not ill-appearing or diaphoretic.  HENT:     Nose: Nose normal.     Mouth/Throat:     Mouth: Mucous membranes are moist.     Pharynx: No oropharyngeal exudate or posterior oropharyngeal erythema.  Eyes:     General: No scleral icterus.    Conjunctiva/sclera: Conjunctivae normal.  Neck:      Musculoskeletal: Normal range of motion. No neck rigidity or muscular tenderness.  Cardiovascular:     Rate and Rhythm: Normal rate and regular rhythm.     Heart sounds: No murmur. No gallop.   Pulmonary:     Effort: Pulmonary effort is normal.     Breath sounds: No stridor. No wheezing, rhonchi or rales.  Abdominal:     General: Abdomen is protuberant. Bowel sounds are normal. There is no distension.     Palpations: Abdomen is soft. There is no hepatomegaly or splenomegaly.     Tenderness: There is no abdominal tenderness.  Genitourinary:    Comments: GU and rectal exams were deferred at his request since he tells me his urologist completed this about 3 to 4 months ago Musculoskeletal: Normal range of motion.     Right lower leg: No edema.     Left lower leg: No edema.  Lymphadenopathy:     Cervical: No cervical adenopathy.  Skin:    General: Skin is warm and dry.     Coloration: Skin is not pale.  Neurological:     General: No focal deficit present.     Mental Status: He is alert.  Psychiatric:        Mood and Affect: Mood normal.        Behavior: Behavior normal.     Lab Results  Component Value Date   WBC 5.3 11/06/2018   HGB 12.3 (L) 11/06/2018   HCT 36.3 (L) 11/06/2018   PLT 181.0 11/06/2018   GLUCOSE 94 11/06/2018   CHOL 153 07/23/2018   TRIG 81.0 07/23/2018   HDL 42.60 07/23/2018   LDLCALC 94 07/23/2018   ALT 17 11/27/2017   AST 18 11/27/2017   NA 140 11/06/2018   K 3.9 11/06/2018   CL 105 11/06/2018   CREATININE 1.03 11/06/2018   BUN 20 11/06/2018   CO2 27 11/06/2018   TSH 1.22 11/06/2018   PSA 0.022 07/18/2018   INR 1.06 08/31/2016    Vas US Carotid  Result Date: 01/09/2018 Carotid Arterial Duplex Study Indications:  The patient has concerns with 5 weeks of vertigo and a "crackling"               in his left ear. He reported 2 episodes of more intense vertigo               and noted that he has a tendency to veer to the left when walking. Risk Factors:  Hypertension, past history of smoking. Performing Technologist: Wilkie Aye RVT  Examination Guidelines: A complete evaluation includes B-mode imaging, spectral Doppler, color Doppler, and power Doppler as needed of all accessible portions of each vessel. Bilateral testing is considered an integral part of  a complete examination. Limited examinations for reoccurring indications may be performed as noted.  Right Carotid Findings: +----------+--------+--------+--------+--------+--------+           PSV cm/sEDV cm/sStenosisDescribeComments +----------+--------+--------+--------+--------+--------+ CCA Prox  99      23                               +----------+--------+--------+--------+--------+--------+ CCA Distal62      17                               +----------+--------+--------+--------+--------+--------+ ICA Prox  70      25      Normal                   +----------+--------+--------+--------+--------+--------+ ICA Mid   68      25                               +----------+--------+--------+--------+--------+--------+ ICA Distal75      30                               +----------+--------+--------+--------+--------+--------+ ECA       92      18                               +----------+--------+--------+--------+--------+--------+ +----------+--------+-------+----------------+-------------------+           PSV cm/sEDV cmsDescribe        Arm Pressure (mmHG) +----------+--------+-------+----------------+-------------------+ Subclavian130            Multiphasic, KDT267                 +----------+--------+-------+----------------+-------------------+ +---------+--------+--+--------+--+---------+ VertebralPSV cm/s46EDV cm/s16Antegrade +---------+--------+--+--------+--+---------+  Left Carotid Findings: +----------+--------+--------+--------+--------+--------+           PSV cm/sEDV cm/sStenosisDescribeComments  +----------+--------+--------+--------+--------+--------+ CCA Prox  117     17                               +----------+--------+--------+--------+--------+--------+ CCA Distal51      15                               +----------+--------+--------+--------+--------+--------+ ICA Prox  72      22      Normal                   +----------+--------+--------+--------+--------+--------+ ICA Mid   76      27                               +----------+--------+--------+--------+--------+--------+ ICA Distal80      29                               +----------+--------+--------+--------+--------+--------+ ECA       74      12                               +----------+--------+--------+--------+--------+--------+ +----------+--------+--------+----------------+-------------------+ SubclavianPSV cm/sEDV cm/sDescribe  Arm Pressure (mmHG) +----------+--------+--------+----------------+-------------------+           149             Multiphasic, WNL150                 +----------+--------+--------+----------------+-------------------+ +---------+--------+--+--------+-+---------+ VertebralPSV cm/s45EDV cm/s6Antegrade +---------+--------+--+--------+-+---------+  Final Interpretation: Right Carotid: There was no evidence of thrombus, dissection, atherosclerotic                plaque or stenosis in the cervical carotid system. Left Carotid: There was no evidence of thrombus, dissection, atherosclerotic               plaque or stenosis in the cervical carotid system. Vertebrals:  Bilateral vertebral arteries demonstrate antegrade flow. Subclavians: Normal flow hemodynamics were seen in bilateral subclavian              arteries. *See table(s) above for measurements and observations.  Electronically signed by Jenkins Rouge MD on 01/09/2018 at 9:15:29 PM.    Final     Assessment & Plan:   Donya was seen today for annual exam and hypertension.  Diagnoses and all orders  for this visit:  Essential hypertension- His blood pressure is not quite adequately well controlled.  He is not willing to add another medication but he does agree to improve his lifestyle modifications.  His labs are negative for secondary causes or endorgan damage. -     CBC with Differential/Platelet; Future -     Basic metabolic panel; Future -     TSH; Future  Routine general medical examination at a health care facility  Dyslipidemia, goal LDL below 100- He has achieved his LDL goal is doing well on the statin. -     TSH; Future  Constipation, unspecified constipation type- His labs are negative for secondary causes.  He will continue using MiraLAX as needed.   I have discontinued Dawsyn A. Mainor's vitamin C and neomycin-polymyxin-hydrocortisone. I am also having him maintain his omeprazole, Probiotic Daily, fluticasone, Magnesium, acetaminophen, CeleBREX, famotidine, Vitamin D3, clonazePAM, Vasculera, rosuvastatin, irbesartan, hydrochlorothiazide, and promethazine. We will stop administering triamcinolone acetonide.  No orders of the defined types were placed in this encounter.    Follow-up: Return in about 6 months (around 05/08/2019).  Scarlette Calico, MD

## 2018-11-06 NOTE — Patient Instructions (Signed)

## 2018-11-07 ENCOUNTER — Encounter: Payer: Self-pay | Admitting: Internal Medicine

## 2018-11-08 NOTE — Progress Notes (Signed)
Subjective:   Patient ID: Christian Ward, male   DOB: 68 y.o.   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

## 2018-11-27 ENCOUNTER — Other Ambulatory Visit: Payer: Self-pay

## 2018-11-27 NOTE — Patient Outreach (Signed)
Christian Ward) Care Management  11/27/2018  Christian Ward [birth year] [id number]   Medication Adherence call to Mr. [name] [facility] spoke with patient he is past due on Rosuvastatin 5 mg patient explain he is no longer taking this medication patient fill the medication but never started taking it patient explain he has been working out and watching what he eats. Patient explain doctor is aware.Mr. Mcenery is showing past due under [place].   [facility] Direct Dial [phone]  Fax [phone] [email or web address]

## 2018-12-24 DIAGNOSIS — M461 Sacroiliitis, not elsewhere classified: Secondary | ICD-10-CM | POA: Diagnosis not present

## 2019-01-10 ENCOUNTER — Ambulatory Visit: Payer: Medicare Other | Admitting: Podiatry

## 2019-01-10 ENCOUNTER — Encounter: Payer: Self-pay | Admitting: Podiatry

## 2019-01-10 ENCOUNTER — Other Ambulatory Visit: Payer: Self-pay

## 2019-01-10 DIAGNOSIS — L600 Ingrowing nail: Secondary | ICD-10-CM

## 2019-01-10 DIAGNOSIS — M722 Plantar fascial fibromatosis: Secondary | ICD-10-CM | POA: Diagnosis not present

## 2019-01-10 MED ORDER — NEOMYCIN-POLYMYXIN-HC 3.5-10000-1 OT SOLN: OTIC | 1 refills | Status: DC

## 2019-01-10 NOTE — Patient Instructions (Signed)

## 2019-01-11 NOTE — Progress Notes (Signed)
Subjective:   Patient ID: Christian Ward, male   DOB: 68 y.o.   MRN: [id number]   HPI Patient states my heel seems to be improved but still have some discomfort but not as bad and I am developing ingrown toenail my right big toe with loosening of the nail bed   ROS      Objective:  Physical Exam  Neurovascular status intact with patient found to have diminished discomfort plantar aspect left heel and the right big toenail is loosened from the bed and the medial border is ingrown and irritated and painful with palpation     Assessment:  Plantar fasciitis present but improving left along with ingrown toenail with thickness of the nail bed right big toe     Plan:  H&P conditions reviewed and for the heel we will continue with supportive shoes and light stretching exercises and for the right I recommended removal of the nail with permanent procedure the medial border.  I explained procedure patient wants this done and signed consent form and today infiltrated the right hallux 60 mg Xylocaine Marcaine mixture sterile prep applied and using sterile instrumentation removed most of the nail leaving a part of the base that healthy and attached and then removed the matrix of the medial side exposed matrix and applied phenol 3 applications 30 seconds followed by alcohol lavage and sterile dressing.  Gave instructions on soaks and patient will be seen back in the next several weeks and is encouraged to call with questions concerns and continue drops intact from previous

## 2019-02-13 ENCOUNTER — Other Ambulatory Visit: Payer: Self-pay | Admitting: Internal Medicine

## 2019-02-13 DIAGNOSIS — I10 Essential (primary) hypertension: Secondary | ICD-10-CM

## 2019-03-07 ENCOUNTER — Ambulatory Visit: Payer: Medicare Other | Admitting: Podiatry

## 2019-03-20 DIAGNOSIS — K219 Gastro-esophageal reflux disease without esophagitis: Secondary | ICD-10-CM | POA: Diagnosis not present

## 2019-03-23 ENCOUNTER — Other Ambulatory Visit: Payer: Self-pay | Admitting: Internal Medicine

## 2019-03-23 DIAGNOSIS — I872 Venous insufficiency (chronic) (peripheral): Secondary | ICD-10-CM

## 2019-03-29 ENCOUNTER — Other Ambulatory Visit: Payer: Self-pay

## 2019-03-29 ENCOUNTER — Ambulatory Visit: Payer: Medicare Other | Admitting: Podiatry

## 2019-04-01 ENCOUNTER — Encounter: Payer: Self-pay | Admitting: Podiatry

## 2019-04-01 ENCOUNTER — Ambulatory Visit: Payer: Medicare Other | Admitting: Podiatry

## 2019-04-01 ENCOUNTER — Other Ambulatory Visit: Payer: Self-pay

## 2019-04-01 DIAGNOSIS — M778 Other enthesopathies, not elsewhere classified: Secondary | ICD-10-CM

## 2019-04-01 DIAGNOSIS — M461 Sacroiliitis, not elsewhere classified: Secondary | ICD-10-CM | POA: Diagnosis not present

## 2019-04-01 DIAGNOSIS — B351 Tinea unguium: Secondary | ICD-10-CM | POA: Diagnosis not present

## 2019-04-02 NOTE — Progress Notes (Signed)
Subjective:   Patient ID: Christian Ward, male   DOB: 68 y.o.   MRN: [id number]   HPI Patient states his heel seems improved along with his ingrown toenail correction but he is having quite a bit of discomfort on the top of the left foot he wanted to get checked   ROS      Objective:  Physical Exam  Neurovascular status intact with significant diminishment of discomfort plantar heel left nail right with medial border not draining anymore with thickness of the nail but he is concerned about and quite a bit of discomfort dorsal aspect of the left foot     Assessment:  Possibility for tendinitis-like condition dorsal left of the extensor group with improved fasciitis nail disease     Plan:  H&P reviewed all 3 conditions and for the fasciitis continue shoe gear modification stretch and nail continue topical medicines.  Did sterile prep and injected the dorsal tendon complex left 3 mg Kenalog 5 mg Xylocaine and reappoint as needed

## 2019-04-17 ENCOUNTER — Ambulatory Visit: Payer: Medicare Other

## 2019-04-30 DIAGNOSIS — C44311 Basal cell carcinoma of skin of nose: Secondary | ICD-10-CM | POA: Diagnosis not present

## 2019-04-30 DIAGNOSIS — D1801 Hemangioma of skin and subcutaneous tissue: Secondary | ICD-10-CM | POA: Diagnosis not present

## 2019-04-30 DIAGNOSIS — L821 Other seborrheic keratosis: Secondary | ICD-10-CM | POA: Diagnosis not present

## 2019-04-30 DIAGNOSIS — D485 Neoplasm of uncertain behavior of skin: Secondary | ICD-10-CM | POA: Diagnosis not present

## 2019-04-30 DIAGNOSIS — C44319 Basal cell carcinoma of skin of other parts of face: Secondary | ICD-10-CM | POA: Diagnosis not present

## 2019-04-30 DIAGNOSIS — L720 Epidermal cyst: Secondary | ICD-10-CM | POA: Diagnosis not present

## 2019-04-30 DIAGNOSIS — L570 Actinic keratosis: Secondary | ICD-10-CM | POA: Diagnosis not present

## 2019-05-01 ENCOUNTER — Other Ambulatory Visit: Payer: Self-pay | Admitting: Internal Medicine

## 2019-05-01 ENCOUNTER — Ambulatory Visit (INDEPENDENT_AMBULATORY_CARE_PROVIDER_SITE_OTHER): Payer: Medicare Other | Admitting: *Deleted

## 2019-05-01 ENCOUNTER — Other Ambulatory Visit: Payer: Self-pay

## 2019-05-01 ENCOUNTER — Telehealth: Payer: Self-pay | Admitting: *Deleted

## 2019-05-01 DIAGNOSIS — Z23 Encounter for immunization: Secondary | ICD-10-CM

## 2019-05-01 DIAGNOSIS — I10 Essential (primary) hypertension: Secondary | ICD-10-CM

## 2019-05-01 DIAGNOSIS — F5104 Psychophysiologic insomnia: Secondary | ICD-10-CM | POA: Insufficient documentation

## 2019-05-01 MED ORDER — BELSOMRA 15 MG PO TABS: 1.0000 | ORAL_TABLET | Freq: Every evening | ORAL | 2 refills | Status: DC | PRN

## 2019-05-01 NOTE — Progress Notes (Addendum)
A user error has taken place.

## 2019-05-01 NOTE — Addendum Note (Signed)
Addended by: Earnstine Regal on: 05/01/2019 11:35 AM   Modules accepted: Orders

## 2019-05-01 NOTE — Telephone Encounter (Signed)
Notified pt w/MD response, and yes he is wanting you to rx something for insomnia.Marland KitchenJohny Ward

## 2019-05-01 NOTE — Telephone Encounter (Signed)
Pt came in to get his flu shot, and wanted to inform MD he is having trouble w/resting at night. He states he takes care of his dementia wife which MD should be aware. He states one night hje may take melatonin 1 mg, the next night may take 1/2 of the valium and so on.  He was telling his wife psychiatrist Dr. Lenice Pressman about not sleeping. He states Dr. Lenice Pressman advise him to ask [name] about rx him Doxepin 25 mg to take at night. If ok he would [facility].Marland KitchenJohny Chess

## 2019-05-02 NOTE — Telephone Encounter (Signed)
Notified pt w/Md response.../lmb 

## 2019-05-08 DIAGNOSIS — H6123 Impacted cerumen, bilateral: Secondary | ICD-10-CM | POA: Diagnosis not present

## 2019-05-08 DIAGNOSIS — J324 Chronic pansinusitis: Secondary | ICD-10-CM | POA: Diagnosis not present

## 2019-05-08 DIAGNOSIS — J3089 Other allergic rhinitis: Secondary | ICD-10-CM | POA: Diagnosis not present

## 2019-05-24 DIAGNOSIS — J324 Chronic pansinusitis: Secondary | ICD-10-CM | POA: Diagnosis not present

## 2019-05-31 ENCOUNTER — Ambulatory Visit: Payer: Medicare Other | Attending: Internal Medicine

## 2019-05-31 DIAGNOSIS — Z23 Encounter for immunization: Secondary | ICD-10-CM

## 2019-05-31 NOTE — Progress Notes (Signed)
   Covid-19 Vaccination Clinic  Name:  Christian Ward    MRN: [id number] DOB: [birth year]  05/31/2019  Mr. [name] was observed post Covid-19 immunization for 15 minutes without incidence. He was provided with Vaccine Information Sheet and instruction to access the V-Safe system.   Mr. [name] was instructed to call 911 with any severe reactions post vaccine: [name] Difficulty breathing  . Swelling of your face and throat  . A fast heartbeat  . A bad rash all over your body  . Dizziness and weakness    Immunizations Administered    Name Date Dose VIS Date Route   Pfizer COVID-19 Vaccine 05/31/2019 10:32 AM 0.3 mL 04/19/2019 Intramuscular   Manufacturer: [place]   Lot: [id number]   [place]: [id number]

## 2019-06-12 DIAGNOSIS — J324 Chronic pansinusitis: Secondary | ICD-10-CM | POA: Diagnosis not present

## 2019-06-20 ENCOUNTER — Ambulatory Visit: Payer: Medicare Other | Attending: Internal Medicine

## 2019-06-20 DIAGNOSIS — Z23 Encounter for immunization: Secondary | ICD-10-CM

## 2019-06-20 NOTE — Progress Notes (Signed)
   Covid-19 Vaccination Clinic  Name:  TYREC WRIDE    MRN: [id number] DOB: 07-29-1950  06/20/2019  Mr. Gillock was observed post Covid-19 immunization for 15 minutes without incidence. He was provided with Vaccine Information Sheet and instruction to access the V-Safe system.   Mr. Sparaco was instructed to call 911 with any severe reactions post vaccine: [name] Difficulty breathing  . Swelling of your face and throat  . A fast heartbeat  . A bad rash all over your body  . Dizziness and weakness    Immunizations Administered    Name Date Dose VIS Date Route   Pfizer COVID-19 Vaccine 06/20/2019 11:38 AM 0.3 mL 04/19/2019 Intramuscular   Manufacturer: [place]   Lot: [id number]   [place]: [id number]

## 2019-07-08 DIAGNOSIS — M461 Sacroiliitis, not elsewhere classified: Secondary | ICD-10-CM | POA: Diagnosis not present

## 2019-08-19 ENCOUNTER — Other Ambulatory Visit: Payer: Self-pay | Admitting: Internal Medicine

## 2019-08-19 DIAGNOSIS — I10 Essential (primary) hypertension: Secondary | ICD-10-CM

## 2019-09-16 ENCOUNTER — Other Ambulatory Visit: Payer: Self-pay | Admitting: Internal Medicine

## 2019-09-16 DIAGNOSIS — F418 Other specified anxiety disorders: Secondary | ICD-10-CM

## 2019-09-16 NOTE — Telephone Encounter (Signed)
Done erx 

## 2019-10-21 DIAGNOSIS — M461 Sacroiliitis, not elsewhere classified: Secondary | ICD-10-CM | POA: Diagnosis not present

## 2019-10-28 DIAGNOSIS — M542 Cervicalgia: Secondary | ICD-10-CM | POA: Diagnosis not present

## 2019-10-28 DIAGNOSIS — M533 Sacrococcygeal disorders, not elsewhere classified: Secondary | ICD-10-CM | POA: Diagnosis not present

## 2019-11-04 LAB — PSA: PSA: 0.66

## 2019-11-13 ENCOUNTER — Other Ambulatory Visit: Payer: Self-pay | Admitting: Internal Medicine

## 2019-11-13 DIAGNOSIS — I10 Essential (primary) hypertension: Secondary | ICD-10-CM

## 2019-11-20 ENCOUNTER — Telehealth: Payer: Self-pay | Admitting: Internal Medicine

## 2019-11-20 DIAGNOSIS — J324 Chronic pansinusitis: Secondary | ICD-10-CM

## 2019-11-20 NOTE — Telephone Encounter (Signed)
New message:   Pt states he needs a referral to a new ENT Dr since he current Dr has retired. Please advise.

## 2019-11-21 MED ORDER — ZOSTER VAC RECOMB ADJUVANTED 50 MCG/0.5ML IM SUSR: 0.5000 mL | Freq: Once | INTRAMUSCULAR | 1 refills | Status: AC

## 2019-11-21 NOTE — Telephone Encounter (Signed)
Pt contacted and pt stated that he is requesting a new referral for an ENT. His current ENT has retired.   Pt stated that he is feeling well and things are going well.   Shingrix vaccine has been sent to the [facility].   Referral for ENT has been entered.

## 2019-12-31 ENCOUNTER — Other Ambulatory Visit: Payer: Self-pay

## 2019-12-31 ENCOUNTER — Encounter (INDEPENDENT_AMBULATORY_CARE_PROVIDER_SITE_OTHER): Payer: Self-pay | Admitting: Otolaryngology

## 2019-12-31 ENCOUNTER — Ambulatory Visit (INDEPENDENT_AMBULATORY_CARE_PROVIDER_SITE_OTHER): Payer: Medicare Other | Admitting: Otolaryngology

## 2019-12-31 VITALS — Temp 97.7°F

## 2019-12-31 DIAGNOSIS — H6123 Impacted cerumen, bilateral: Secondary | ICD-10-CM | POA: Diagnosis not present

## 2019-12-31 DIAGNOSIS — J310 Chronic rhinitis: Secondary | ICD-10-CM | POA: Diagnosis not present

## 2019-12-31 DIAGNOSIS — R42 Dizziness and giddiness: Secondary | ICD-10-CM | POA: Diagnosis not present

## 2019-12-31 NOTE — Progress Notes (Signed)
HPI: Christian Ward is a 69 y.o. male who presents is referred by Christian Ward for evaluation of sinus problems and dizziness.  He apparently has had a history of frequent sinus infections in the past.  He had previously seen Christian Ward but has not had an ENT check since Christian. Janace Ward retired.  He does buildup wax in his ears on a regular basis but does not have any problems with his hearing.  He says he had a sinus infection that got him off balance recently.  He does describe postnasal drainage but no real colored drainage.  He is not coughing or coughing up any colored mucus.  His dizziness is more of an imbalance than actual vertigo or spinning sensation.  He had an MRI scan performed 2 years ago and I reviewed this and this showed clear paranasal sinuses at that time. In the past he has been tried on several different antibiotics but felt like Augmentin worked the best for him.  Unfortunately I do not have Christian. Janace Ward records but Christian Ward will have them sent here..  Past Medical History:  Diagnosis Date  . Acid reflux   . Cancer ([place])   . 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 Complication of anesthesia    trouble waking up  . Fibromyalgia   . H/O infectious mononucleosis   . History of shingles   . Hypercholesterolemia   . IBS (irritable bowel syndrome)   . 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 S/P radiation therapy   . Shoulder pain, left    Past Surgical History:  Procedure Laterality Date  . APPENDECTOMY  age 64  . CHOLECYSTECTOMY  s/p lap chole 4/07   Christian Christian Ward  . COLONOSCOPY    . PROSTATE BIOPSY  2008, 10/2008  . PROSTATECTOMY  08/06/2009   Gleason 4+3=7  . SACROILIAC JOINT FUSION Right 09/08/2016   Procedure: RIGHT SIDED SACROILIAC JOINT FUSION;  Surgeon: Christian Bob, MD;  Location: [facility];  Service: Orthopedics;  Laterality: Right;  RIGHT SIDED SACROILIAC JOINT FUSION  . WISDOM TOOTH EXTRACTION     Social History   Socioeconomic History  . Marital  status: Married    Spouse name: Not on file  . Number of children: Not on file  . Years of education: Not on file  . Highest education level: Not on file  Occupational History  . Not on file  Tobacco Use  . Smoking status: Former Smoker    Packs/day: 0.50    Years: 10.00    Pack years: 5.00    Types: Cigarettes    Start date: 05/09/1980    Quit date: 03/16/1986    Years since quitting: 33.8  . Smokeless tobacco: Never Used  Substance and Sexual Activity  . Alcohol use: Yes    Alcohol/week: 0.0 standard drinks    Comment: weekend  . Drug use: No  . Sexual activity: Not Currently  Other Topics Concern  . Not on file  Social History Narrative  . Not on file   Social Determinants of Health   Financial Resource Strain:   . Difficulty of Paying Living Expenses: Not on file  Food Insecurity:   . Worried About Charity fundraiser in the Last Year: Not on file  . Ran Out of Food in the Last Year: Not on file  Transportation Needs:   . Lack of Transportation (Medical): Not on file  . Lack of Transportation (Non-Medical): Not on file  Physical  Activity:   . Days of Exercise per Week: Not on file  . Minutes of Exercise per Session: Not on file  Stress:   . Feeling of Stress : Not on file  Social Connections:   . Frequency of Communication with Friends and Family: Not on file  . Frequency of Social Gatherings with Friends and Family: Not on file  . Attends Religious Services: Not on file  . Active Member of Clubs or Organizations: Not on file  . Attends Archivist Meetings: Not on file  . Marital Status: Not on file   Family History  Problem Relation Age of Onset  . Hypertension Father   . Cancer Father        prostate  . Stroke Father   . COPD Father   . Coronary artery disease Father   . Lung disease Father   . Heart disease Father   . Diabetes Father   . Lung disease Mother   . Diabetes Sister   . Cancer Brother        surgery   Allergies  Allergen  Reactions  . Tramadol Nausea And Vomiting  . Oxycodone Nausea Only   Prior to Admission medications   Medication Sig Start Date End Date Taking? Authorizing Provider  acetaminophen (TYLENOL) 500 MG tablet Take 500 mg by mouth every 6 (six) hours as needed for mild pain.   Yes [provider]  CELEBREX 200 MG capsule Take 200 mg by mouth daily. 12/16/16  Yes [provider]  Cholecalciferol (VITAMIN D3) 100000 UNIT/GM POWD Take by mouth.   Yes [provider]  clonazePAM (KLONOPIN) 0.5 MG tablet TAKE 1 TABLET (0.5 MG TOTAL) BY MOUTH 2 (TWO) TIMES DAILY AS NEEDED FOR ANXIETY. 09/16/19  Yes Biagio Borg, MD  diazepam (VALIUM) 2 MG tablet Take by mouth.   Yes [provider]  Dietary Management Product (VASCULERA) TABS TAKE ONE TABLET BY MOUTH EVERY DAY 03/24/19  Yes Janith Lima, MD  famotidine (PEPCID) 20 MG tablet Take 1 tablet (20 mg total) by mouth 2 (two) times daily. 12/18/16  Yes Fawze, Mina A, PA-C  fluticasone (FLONASE) 50 MCG/ACT nasal spray Place 1 spray into both nostrils daily as needed for allergies.    Yes [provider]  hydrochlorothiazide (MICROZIDE) 12.5 MG capsule Take 1 capsule (12.5 mg total) by mouth daily. 08/22/18  Yes Janith Lima, MD  irbesartan (AVAPRO) 150 MG tablet TAKE 1 TABLET BY MOUTH EVERY DAY 11/13/19  Yes Janith Lima, MD  Magnesium 250 MG TABS Take 250 mg by mouth daily.   Yes [provider]  neomycin-polymyxin-hydrocortisone (CORTISPORIN) OTIC solution Apply 1-2 drops to toe BID after soaking 01/10/19  Yes Regal, Tamala Fothergill, DPM  omeprazole (PRILOSEC) 20 MG capsule Take 20 mg by mouth 2 (two) times daily as needed (acid reflux). Takes 1 daily and may take a second dose if needed   Yes [provider]  pregabalin (LYRICA) 50 MG capsule Take 50 mg by mouth 2 (two) times daily. 11/15/18  Yes [provider]  Probiotic Product (PROBIOTIC DAILY) CAPS Take 1 capsule by mouth daily.   Yes [provider]  promethazine (PHENERGAN) 25 MG tablet Take by mouth. 10/04/18  Yes [provider]  rosuvastatin (CRESTOR) 5 MG tablet Take 1 tablet (5 mg total) by mouth daily. 07/23/18  Yes Janith Lima, MD  Suvorexant (BELSOMRA) 15 MG TABS Take 1 tablet by mouth at bedtime as needed. 05/01/19  Yes Janith Lima, MD     Positive ROS: Otherwise negative  All other systems have been reviewed and were otherwise negative with the exception of those mentioned in the HPI and as above.  Physical Exam: Constitutional: Alert, well-appearing, no acute distress Ears: External ears without lesions or tenderness.  He has wax buildup in both ear canals that was cleaned with suction and forceps.  The TMs were clear bilaterally with good mobility on pneumatic otoscopy.  On Dix-Hallpike testing there is no clinical evidence of BPPV Nasal: External nose without lesions. Septum is slightly deviated to the left.  After decongesting the nose both millimeters regions were clear with no evidence of mucopurulent discharge.  No polyps noted.  Mucus within the nasal cavity is clear. Oral: Lips and gums without lesions. Tongue and palate mucosa without lesions. Posterior oropharynx clear.  No significant drainage noted on the posterior oropharyngeal wall Neck: No palpable adenopathy or masses Respiratory: Breathing comfortably  Skin: No facial/neck lesions or rash noted.  Cerumen impaction removal  Date/Time: 12/31/2019 3:49 PM Performed by: Rozetta Nunnery, MD Authorized by: Rozetta Nunnery, MD   Consent:    Consent obtained:  Verbal   Consent given by:  Patient   Risks discussed:  Pain and bleeding Procedure details:    Location:  L ear and R ear   Procedure type: curette, suction and forceps   Post-procedure details:    Inspection:  TM intact and canal normal   Hearing quality:  Improved   Patient tolerance of procedure:  Tolerated well, no immediate complications Comments:      TMs are clear bilaterally    Assessment: Chronic rhinitis.  On clinical exam no evidence of acute infection requiring antibiotic therapy at this time. Wax buildup in both ear canals that was cleaned in the office with clear TMs. Dizziness questionable etiology.  Patient has been under a fair amount of stress which may be contributing some to this.  No clinical evidence of inner ear abnormality.  Plan: Recommended regular use of Flonase which he uses presently intermittently 2 sprays each nostril at night when he goes to bed. He has previously used saline rinse in the form of a Nettie pot recommended use of this when he is having excessive mucus drainage. He will follow-up if he develops any yellow-green discharge or fever associated with sinus issues.  But presently today no evidence of acute infection.  Findings are mostly consistent with chronic rhinitis.   Radene Journey, MD   CC:

## 2020-01-14 DIAGNOSIS — M461 Sacroiliitis, not elsewhere classified: Secondary | ICD-10-CM | POA: Diagnosis not present

## 2020-02-05 ENCOUNTER — Other Ambulatory Visit: Payer: Self-pay | Admitting: Internal Medicine

## 2020-02-05 DIAGNOSIS — I10 Essential (primary) hypertension: Secondary | ICD-10-CM

## 2020-02-06 ENCOUNTER — Other Ambulatory Visit: Payer: Self-pay | Admitting: Internal Medicine

## 2020-02-10 ENCOUNTER — Other Ambulatory Visit: Payer: Self-pay | Admitting: Internal Medicine

## 2020-02-10 DIAGNOSIS — I10 Essential (primary) hypertension: Secondary | ICD-10-CM

## 2020-02-19 ENCOUNTER — Other Ambulatory Visit: Payer: Self-pay

## 2020-02-19 ENCOUNTER — Encounter: Payer: Self-pay | Admitting: Internal Medicine

## 2020-02-19 ENCOUNTER — Ambulatory Visit (INDEPENDENT_AMBULATORY_CARE_PROVIDER_SITE_OTHER): Payer: Medicare Other | Admitting: Internal Medicine

## 2020-02-19 VITALS — BP 132/82 | HR 70 | Temp 98.2°F | Resp 16 | Ht 76.0 in | Wt 219.0 lb

## 2020-02-19 DIAGNOSIS — C61 Malignant neoplasm of prostate: Secondary | ICD-10-CM

## 2020-02-19 DIAGNOSIS — E785 Hyperlipidemia, unspecified: Secondary | ICD-10-CM | POA: Diagnosis not present

## 2020-02-19 DIAGNOSIS — Z0000 Encounter for general adult medical examination without abnormal findings: Secondary | ICD-10-CM

## 2020-02-19 DIAGNOSIS — I10 Essential (primary) hypertension: Secondary | ICD-10-CM | POA: Diagnosis not present

## 2020-02-19 DIAGNOSIS — K219 Gastro-esophageal reflux disease without esophagitis: Secondary | ICD-10-CM

## 2020-02-19 DIAGNOSIS — E7800 Pure hypercholesterolemia, unspecified: Secondary | ICD-10-CM

## 2020-02-19 DIAGNOSIS — D539 Nutritional anemia, unspecified: Secondary | ICD-10-CM

## 2020-02-19 LAB — HEPATIC FUNCTION PANEL
ALT: 21 U/L (ref 0–53)
AST: 25 U/L (ref 0–37)
Albumin: 4 g/dL (ref 3.5–5.2)
Alkaline Phosphatase: 85 U/L (ref 39–117)
Bilirubin, Direct: 0.2 mg/dL (ref 0.0–0.3)
Total Bilirubin: 0.8 mg/dL (ref 0.2–1.2)
Total Protein: 6.8 g/dL (ref 6.0–8.3)

## 2020-02-19 LAB — CBC WITH DIFFERENTIAL/PLATELET
Basophils Absolute: 0 10*3/uL (ref 0.0–0.1)
Basophils Relative: 0.5 % (ref 0.0–3.0)
Eosinophils Absolute: 0.1 10*3/uL (ref 0.0–0.7)
Eosinophils Relative: 1.7 % (ref 0.0–5.0)
HCT: 36.1 % — ABNORMAL LOW (ref 39.0–52.0)
Hemoglobin: 12.1 g/dL — ABNORMAL LOW (ref 13.0–17.0)
Lymphocytes Relative: 18 % (ref 12.0–46.0)
Lymphs Abs: 1.2 10*3/uL (ref 0.7–4.0)
MCHC: 33.6 g/dL (ref 30.0–36.0)
MCV: 92.6 fl (ref 78.0–100.0)
Monocytes Absolute: 0.5 10*3/uL (ref 0.1–1.0)
Monocytes Relative: 8.1 % (ref 3.0–12.0)
Neutro Abs: 4.6 10*3/uL (ref 1.4–7.7)
Neutrophils Relative %: 71.7 % (ref 43.0–77.0)
Platelets: 210 10*3/uL (ref 150.0–400.0)
RBC: 3.9 Mil/uL — ABNORMAL LOW (ref 4.22–5.81)
RDW: 13.3 % (ref 11.5–15.5)
WBC: 6.4 10*3/uL (ref 4.0–10.5)

## 2020-02-19 LAB — BASIC METABOLIC PANEL
BUN: 20 mg/dL (ref 6–23)
CO2: 29 mEq/L (ref 19–32)
Calcium: 9 mg/dL (ref 8.4–10.5)
Chloride: 104 mEq/L (ref 96–112)
Creatinine, Ser: 1.02 mg/dL (ref 0.40–1.50)
GFR: 74.85 mL/min (ref >60.00)
Glucose, Bld: 116 mg/dL — ABNORMAL HIGH (ref 70–99)
Potassium: 4 mEq/L (ref 3.5–5.1)
Sodium: 138 mEq/L (ref 135–145)

## 2020-02-19 LAB — LIPID PANEL
Cholesterol: 157 mg/dL (ref 0–200)
HDL: 40.7 mg/dL (ref >39.00)
LDL Cholesterol: 99 mg/dL (ref 0–99)
NonHDL: 116.11
Total CHOL/HDL Ratio: 4
Triglycerides: 87 mg/dL (ref 0.0–149.0)
VLDL: 17.4 mg/dL (ref 0.0–40.0)

## 2020-02-19 LAB — TSH: TSH: 1.48 u[IU]/mL (ref 0.35–4.50)

## 2020-02-19 MED ORDER — ROSUVASTATIN CALCIUM 5 MG PO TABS: 5.0000 mg | ORAL_TABLET | Freq: Every day | ORAL | 1 refills | Status: DC

## 2020-02-19 MED ORDER — IRBESARTAN 150 MG PO TABS: 150.0000 mg | ORAL_TABLET | Freq: Every day | ORAL | 0 refills | Status: DC

## 2020-02-19 NOTE — Patient Instructions (Signed)

## 2020-02-19 NOTE — Progress Notes (Signed)
Subjective:  Patient ID: Christian Ward, male    DOB: 18-Mar-1951  Age: 69 y.o. MRN: [id number]  CC: Annual Exam, Anemia, and Hypertension  This visit occurred during the SARS-CoV-2 public health emergency.  Safety protocols were in place, including screening questions prior to the visit, additional usage of staff PPE, and extensive cleaning of exam room while observing appropriate contact time as indicated for disinfecting solutions.    HPI Christian Ward presents for a CPX.  He is active and denies any recent episodes of chest pain, shortness of breath, palpitations, edema, or fatigue.  He is compliant with the ARB and denies any recent episodes of dizziness or lightheadedness.  He continues to struggle with mild anxiety and occasionally takes clonazepam.  He has decided not to take an antidepressant or anything for insomnia.  He has a chronic anemia.  He is not aware of any sources of blood loss.  He denies paresthesias, bleeding, or bruising.    Outpatient Medications Prior to Visit  Medication Sig Dispense Refill  . acetaminophen (TYLENOL) 500 MG tablet Take 500 mg by mouth every 6 (six) hours as needed for mild pain.    . Cholecalciferol (VITAMIN D3) 100000 UNIT/GM POWD Take by mouth.    . clonazePAM (KLONOPIN) 0.5 MG tablet TAKE 1 TABLET (0.5 MG TOTAL) BY MOUTH 2 (TWO) TIMES DAILY AS NEEDED FOR ANXIETY. 60 tablet 0  . Dietary Management Product (VASCULERA) TABS TAKE ONE TABLET BY MOUTH EVERY DAY 30 tablet 11  . fluticasone (FLONASE) 50 MCG/ACT nasal spray Place 1 spray into both nostrils daily as needed for allergies.     . Magnesium 250 MG TABS Take 250 mg by mouth daily.    [name] omeprazole (PRILOSEC) 20 MG capsule Take 20 mg by mouth 2 (two) times daily as needed (acid reflux). Takes 1 daily and may take a second dose if needed    . Probiotic Product (PROBIOTIC DAILY) CAPS Take 1 capsule by mouth daily.    . CELEBREX 200 MG capsule Take 200 mg by mouth daily.  3  . diazepam (VALIUM)  2 MG tablet Take by mouth.    . famotidine (PEPCID) 20 MG tablet Take 1 tablet (20 mg total) by mouth 2 (two) times daily. 10 tablet 0  . hydrochlorothiazide (MICROZIDE) 12.5 MG capsule Take 1 capsule (12.5 mg total) by mouth daily. 90 capsule 1  . irbesartan (AVAPRO) 150 MG tablet TAKE 1 TABLET BY MOUTH EVERY DAY 90 tablet 0  . neomycin-polymyxin-hydrocortisone (CORTISPORIN) OTIC solution Apply 1-2 drops to toe BID after soaking 10 mL 1  . pregabalin (LYRICA) 50 MG capsule Take 50 mg by mouth 2 (two) times daily.    . promethazine (PHENERGAN) 25 MG tablet Take by mouth.    . rosuvastatin (CRESTOR) 5 MG tablet Take 1 tablet (5 mg total) by mouth daily. 90 tablet 1  . Suvorexant (BELSOMRA) 15 MG TABS Take 1 tablet by mouth at bedtime as needed. 30 tablet 2   No facility-administered medications prior to visit.    ROS Review of Systems  Constitutional: Negative for appetite change, chills, diaphoresis, fatigue and fever.  HENT: Negative.  Negative for sore throat, trouble swallowing and voice change.   Eyes: Negative for visual disturbance.  Respiratory: Negative for cough, chest tightness, shortness of breath and wheezing.   Cardiovascular: Negative for chest pain, palpitations and leg swelling.  Gastrointestinal: Negative for abdominal pain, blood in stool, constipation, diarrhea, nausea and vomiting.  Endocrine: Negative.  Genitourinary: Negative.  Negative for difficulty urinating, dysuria and hematuria.  Musculoskeletal: Negative.  Negative for arthralgias and myalgias.  Skin: Negative.  Negative for color change and pallor.  Neurological: Negative.  Negative for dizziness, weakness, numbness and headaches.  Hematological: Negative for adenopathy. Does not bruise/bleed easily.  Psychiatric/Behavioral: Negative for behavioral problems, confusion, decreased concentration, dysphoric mood, self-injury, sleep disturbance and suicidal ideas. The patient is nervous/anxious. The patient is not  hyperactive.     Objective:  BP 132/82   Pulse 70   Temp 98.2 F (36.8 C) (Oral)   Resp 16   Ht 6\' 4"  (1.93 m)   Wt 219 lb (99.3 kg)   SpO2 97%   BMI 26.66 kg/m   BP Readings from Last 3 Encounters:  02/19/20 132/82  11/06/18 (!) 148/84  09/24/18 (!) 162/91    Wt Readings from Last 3 Encounters:  02/19/20 219 lb (99.3 kg)  11/06/18 221 lb (100.2 kg)  07/23/18 223 lb 4 oz (101.3 kg)    Physical Exam Vitals reviewed.  Constitutional:      Appearance: Normal appearance.  HENT:     Nose: Nose normal.     Mouth/Throat:     Mouth: Mucous membranes are moist.  Eyes:     General: No scleral icterus.    Conjunctiva/sclera: Conjunctivae normal.  Cardiovascular:     Rate and Rhythm: Normal rate and regular rhythm.     Heart sounds: No murmur heard.   Pulmonary:     Effort: Pulmonary effort is normal.     Breath sounds: No stridor. No wheezing, rhonchi or rales.  Abdominal:     General: Abdomen is flat. Bowel sounds are normal. There is no distension.     Palpations: Abdomen is soft. There is no hepatomegaly, splenomegaly or mass.     Tenderness: There is no abdominal tenderness.     Hernia: No hernia is present.  Genitourinary:    Comments: Deferred at his request, done by urology 3 months ago Musculoskeletal:        General: Normal range of motion.     Cervical back: Neck supple.     Right lower leg: No edema.     Left lower leg: No edema.  Lymphadenopathy:     Cervical: No cervical adenopathy.  Skin:    General: Skin is warm and dry.     Coloration: Skin is not pale.  Neurological:     General: No focal deficit present.     Mental Status: He is alert.  Psychiatric:        Mood and Affect: Mood normal.        Behavior: Behavior normal.     Lab Results  Component Value Date   WBC 6.4 02/19/2020   HGB 12.1 (L) 02/19/2020   HCT 36.1 (L) 02/19/2020   PLT 210.0 02/19/2020   GLUCOSE 116 (H) 02/19/2020   CHOL 157 02/19/2020   TRIG 87.0 02/19/2020   HDL  40.70 02/19/2020   LDLCALC 99 02/19/2020   ALT 21 02/19/2020   AST 25 02/19/2020   NA 138 02/19/2020   K 4.0 02/19/2020   CL 104 02/19/2020   CREATININE 1.02 02/19/2020   BUN 20 02/19/2020   CO2 29 02/19/2020   TSH 1.48 02/19/2020   PSA 0.66 11/04/2019   INR 1.06 08/31/2016    VAS US CAROTID  Result Date: 01/09/2018 Carotid Arterial Duplex Study Indications:  The patient has concerns with 5 weeks of vertigo and a "crackling"  in his left ear. He reported 2 episodes of more intense vertigo               and noted that he has a tendency to veer to the left when walking. Risk Factors: Hypertension, past history of smoking. Performing Technologist: Wilkie Aye RVT  Examination Guidelines: A complete evaluation includes B-mode imaging, spectral Doppler, color Doppler, and power Doppler as needed of all accessible portions of each vessel. Bilateral testing is considered an integral part of a complete examination. Limited examinations for reoccurring indications may be performed as noted.  Right Carotid Findings: +----------+--------+--------+--------+--------+--------+           PSV cm/sEDV cm/sStenosisDescribeComments +----------+--------+--------+--------+--------+--------+ CCA Prox  99      23                               +----------+--------+--------+--------+--------+--------+ CCA Distal62      17                               +----------+--------+--------+--------+--------+--------+ ICA Prox  70      25      Normal                   +----------+--------+--------+--------+--------+--------+ ICA Mid   68      25                               +----------+--------+--------+--------+--------+--------+ ICA Distal75      30                               +----------+--------+--------+--------+--------+--------+ ECA       92      18                               +----------+--------+--------+--------+--------+--------+  +----------+--------+-------+----------------+-------------------+           PSV cm/sEDV cmsDescribe        Arm Pressure (mmHG) +----------+--------+-------+----------------+-------------------+ Subclavian130            Multiphasic, [id number]                 +----------+--------+-------+----------------+-------------------+ +---------+--------+--+--------+--+---------+ VertebralPSV cm/s46EDV cm/s16Antegrade +---------+--------+--+--------+--+---------+  Left Carotid Findings: +----------+--------+--------+--------+--------+--------+           PSV cm/sEDV cm/sStenosisDescribeComments +----------+--------+--------+--------+--------+--------+ CCA Prox  117     17                               +----------+--------+--------+--------+--------+--------+ CCA Distal51      15                               +----------+--------+--------+--------+--------+--------+ ICA Prox  72      22      Normal                   +----------+--------+--------+--------+--------+--------+ ICA Mid   76      27                               +----------+--------+--------+--------+--------+--------+ ICA Distal80  29                               +----------+--------+--------+--------+--------+--------+ ECA       74      12                               +----------+--------+--------+--------+--------+--------+ +----------+--------+--------+----------------+-------------------+ SubclavianPSV cm/sEDV cm/sDescribe        Arm Pressure (mmHG) +----------+--------+--------+----------------+-------------------+           149             Multiphasic, WNL150                 +----------+--------+--------+----------------+-------------------+ +---------+--------+--+--------+-+---------+ VertebralPSV cm/s45EDV cm/s6Antegrade +---------+--------+--+--------+-+---------+  Final Interpretation: Right Carotid: There was no evidence of thrombus, dissection, atherosclerotic                 plaque or stenosis in the cervical carotid system. Left Carotid: There was no evidence of thrombus, dissection, atherosclerotic               plaque or stenosis in the cervical carotid system. Vertebrals:  Bilateral vertebral arteries demonstrate antegrade flow. Subclavians: Normal flow hemodynamics were seen in bilateral subclavian              arteries. *See table(s) above for measurements and observations.  Electronically signed by Jenkins Rouge MD on 01/09/2018 at 9:15:29 PM.    Final     Assessment & Plan:   Juwaun was seen today for annual exam, anemia and hypertension.  Diagnoses and all orders for this visit:  Essential hypertension- His blood pressure is adequately well controlled.  Will continue the ARB.  Electrolytes and renal function are normal. -     irbesartan (AVAPRO) 150 MG tablet; Take 1 tablet (150 mg total) by mouth daily. -     CBC with Differential/Platelet; Future -     BASIC METABOLIC PANEL WITH GFR; Future -     TSH; Future -     Cancel: Urinalysis, Routine w reflex microscopic; Future -     Basic metabolic panel; Future -     Basic metabolic panel -     TSH -     CBC with Differential/Platelet  Routine general medical examination at a health care facility- Exam completed, labs reviewed, vaccines reviewed - She refused a flu vaccine today, cancer screenings are up-to-date, patient education material was given.  Dyslipidemia, goal LDL below 100- He has an elevated ASCVD risk score.  I recommended that he take a statin for CV risk reduction. -     Lipid panel; Future -     Hepatic function panel; Future -     Lipid panel -     Hepatic function panel -     rosuvastatin (CRESTOR) 5 MG tablet; Take 1 tablet (5 mg total) by mouth daily.  Gastroesophageal reflux disease without esophagitis- His symptoms are well controlled with an H2 blocker. -     CBC with Differential/Platelet; Future -     CBC with Differential/Platelet  ADENOCARCINOMA, PROSTATE- His PSA was  normal 6 months ago.  There is no evidence of recurrence. -     Cancel: PSA; Future  Deficiency anemia- His H&H have worsened slightly.  I have asked him to return to be screened for vitamin deficiencies. -     Iron; Future -     Vitamin  B12; Future -     Reticulocytes; Future -     Folate; Future -     Ferritin; Future -     Vitamin B1; Future  HYPERCHOLESTEROLEMIA, MILD   I have discontinued Lanny Hurst A. Berger's CeleBREX, famotidine, hydrochlorothiazide, promethazine, pregabalin, neomycin-polymyxin-hydrocortisone, diazepam, and Belsomra. I have also changed his irbesartan. Additionally, I am having him maintain his omeprazole, Probiotic Daily, fluticasone, Magnesium, acetaminophen, Vitamin D3, Vasculera, clonazePAM, and rosuvastatin.  Meds ordered this encounter  Medications  . irbesartan (AVAPRO) 150 MG tablet    Sig: Take 1 tablet (150 mg total) by mouth daily.    Dispense:  90 tablet    Refill:  0  . rosuvastatin (CRESTOR) 5 MG tablet    Sig: Take 1 tablet (5 mg total) by mouth daily.    Dispense:  90 tablet    Refill:  1   In addition to time spent on CPE, I spent 50 minutes in preparing to see the patient by review of recent labs, imaging and procedures, obtaining and reviewing separately obtained history, communicating with the patient and family or caregiver, ordering medications, tests or procedures, and documenting clinical information in the EHR including the differential Dx, treatment, and any further evaluation and other management of 1. Essential hypertension 2. Dyslipidemia, goal LDL below 100 3. Gastroesophageal reflux disease without esophagitis 4. ADENOCARCINOMA, PROSTATE 5. Deficiency anemia 6. HYPERCHOLESTEROLEMIA, MILD     Follow-up: Return in about 6 months (around 08/19/2020).  Scarlette Calico, MD

## 2020-02-21 ENCOUNTER — Encounter: Payer: Self-pay | Admitting: Internal Medicine

## 2020-04-20 DIAGNOSIS — M461 Sacroiliitis, not elsewhere classified: Secondary | ICD-10-CM | POA: Diagnosis not present

## 2020-04-24 ENCOUNTER — Ambulatory Visit: Payer: Medicare Other | Attending: Internal Medicine

## 2020-04-24 DIAGNOSIS — Z23 Encounter for immunization: Secondary | ICD-10-CM

## 2020-04-24 NOTE — Progress Notes (Signed)
° °  Covid-19 Vaccination Clinic  Name:  Christian Ward    MRN: [id number] DOB: 1951-01-27  04/24/2020  [name] was observed post Covid-19 immunization for 15 minutes without incident. He was provided with Vaccine Information Sheet and instruction to access the V-Safe system.   [name] was instructed to call 911 with any severe reactions post vaccine:  Difficulty breathing   Swelling of face and throat   A fast heartbeat   A bad rash all over body   Dizziness and weakness   Immunizations Administered    Name Date Dose VIS Date Route   Pfizer COVID-19 Vaccine 04/24/2020  3:23 PM 0.3 mL 02/26/2020 Intramuscular   Manufacturer: [place]   Lot: [id number]   NDC: [address]-8

## 2020-05-03 DIAGNOSIS — Z20822 Contact with and (suspected) exposure to covid-19: Secondary | ICD-10-CM | POA: Diagnosis not present

## 2020-05-04 DIAGNOSIS — R21 Rash and other nonspecific skin eruption: Secondary | ICD-10-CM | POA: Diagnosis not present

## 2020-05-14 ENCOUNTER — Other Ambulatory Visit: Payer: Self-pay | Admitting: Internal Medicine

## 2020-05-14 DIAGNOSIS — I10 Essential (primary) hypertension: Secondary | ICD-10-CM

## 2020-06-01 DIAGNOSIS — M25571 Pain in right ankle and joints of right foot: Secondary | ICD-10-CM | POA: Diagnosis not present

## 2020-06-17 ENCOUNTER — Telehealth: Payer: Self-pay | Admitting: Internal Medicine

## 2020-06-17 NOTE — Telephone Encounter (Signed)
1.Medication Requested: Dietary Management Product (VASCULERA) TABS    2. Pharmacy (Name, Street, [place]): [place], [place] - [address]  3. On Med List: yes   4. Last Visit with PCP: 10.13.21  5. Next visit date with PCP: n/a    Agent: Please be advised that RX refills may take up to 3 business days. We ask that you follow-up with your pharmacy.

## 2020-06-18 ENCOUNTER — Other Ambulatory Visit: Payer: Self-pay | Admitting: Internal Medicine

## 2020-06-18 DIAGNOSIS — I872 Venous insufficiency (chronic) (peripheral): Secondary | ICD-10-CM

## 2020-06-18 MED ORDER — VASCULERA PO TABS: 1.0000 | ORAL_TABLET | Freq: Every day | ORAL | 11 refills | Status: DC

## 2020-07-01 DIAGNOSIS — M25571 Pain in right ankle and joints of right foot: Secondary | ICD-10-CM | POA: Diagnosis not present

## 2020-07-13 DIAGNOSIS — M461 Sacroiliitis, not elsewhere classified: Secondary | ICD-10-CM | POA: Diagnosis not present

## 2020-07-21 DIAGNOSIS — M545 Low back pain, unspecified: Secondary | ICD-10-CM | POA: Diagnosis not present

## 2020-07-29 DIAGNOSIS — M545 Low back pain, unspecified: Secondary | ICD-10-CM | POA: Diagnosis not present

## 2020-08-03 ENCOUNTER — Encounter: Payer: Self-pay | Admitting: Podiatry

## 2020-08-03 ENCOUNTER — Other Ambulatory Visit: Payer: Self-pay

## 2020-08-03 ENCOUNTER — Ambulatory Visit: Payer: Medicare Other | Admitting: Podiatry

## 2020-08-03 DIAGNOSIS — L600 Ingrowing nail: Secondary | ICD-10-CM

## 2020-08-03 NOTE — Patient Instructions (Signed)
Place 1/4 cup of epsom salts in a quart of warm tap water.  Submerge your foot or feet in the solution and soak for 20 minutes.  This soak should be done twice a day.  Next, remove your foot or feet from solution, blot dry the affected area. Apply ointment and cover if instructed by your doctor.   IF YOUR SKIN BECOMES IRRITATED WHILE USING THESE INSTRUCTIONS, IT IS OKAY TO SWITCH TO  WHITE VINEGAR AND WATER.  As another alternative soak, you may use antibacterial soap and water.  Monitor for any signs/symptoms of infection. Call the office immediately if any occur or go directly to the emergency room. Call with any questions/concerns.  Ingrown Toenail An ingrown toenail occurs when the corner or sides of a toenail grow into the surrounding skin. This causes discomfort and pain. The big toe is most commonly affected, but any of the toes can be affected. If an ingrown toenail is not treated, it can become infected. What are the causes? This condition may be caused by:  Wearing shoes that are too small or tight.  An injury, such as stubbing your toe or having your toe stepped on.  Improper cutting or care of your toenails.  Having nail or foot abnormalities that were present from birth (congenital abnormalities), such as having a nail that is too big for your toe. What increases the risk? The following factors may make you more likely to develop ingrown toenails:  Age. Nails tend to get thicker with age, so ingrown nails are more common among older people.  Cutting your toenails incorrectly, such as cutting them very short or cutting them unevenly. An ingrown toenail is more likely to get infected if you have:  Diabetes.  Blood flow (circulation) problems. What are the signs or symptoms? Symptoms of an ingrown toenail may include:  Pain, soreness, or tenderness.  Redness.  Swelling.  Hardening of the skin that surrounds the toenail. Signs that an ingrown toenail may be infected  include:  Fluid or pus.  Symptoms that get worse instead of better. How is this diagnosed? An ingrown toenail may be diagnosed based on your medical history, your symptoms, and a physical exam. If you have fluid or blood coming from your toenail, a sample may be collected to test for the specific type of bacteria that is causing the infection. How is this treated? Treatment depends on how severe your ingrown toenail is. You may be able to care for your toenail at home.  If you have an infection, you may be prescribed antibiotic medicines.  If you have fluid or pus draining from your toenail, your health care provider may drain it.  If you have trouble walking, you may be given crutches to use.  If you have a severe or infected ingrown toenail, you may need a procedure to remove part or all of the nail. Follow these instructions at home: Foot care  Do not pick at your toenail or try to remove it yourself.  Soak your foot in warm, soapy water. Do this for 20 minutes, 3 times a day, or as often as told by your health care provider. This helps to keep your toe clean and keep your skin soft.  Wear shoes that fit well and are not too tight. Your health care provider may recommend that you wear open-toed shoes while you heal.  Trim your toenails regularly and carefully. Cut your toenails straight across to prevent injury to the skin at the corners   of the toenail. Do not cut your nails in a curved shape.  Keep your feet clean and dry to help prevent infection.   Medicines  Take over-the-counter and prescription medicines only as told by your health care provider.  If you were prescribed an antibiotic, take it as told by your health care provider. Do not stop taking the antibiotic even if you start to feel better. Activity  Return to your normal activities as told by your health care provider. Ask your health care provider what activities are safe for you.  Avoid activities that cause  pain. General instructions  If your health care provider told you to use crutches to help you move around, use them as instructed.  Keep all follow-up visits as told by your health care provider. This is important. Contact a health care provider if:  You have more redness, swelling, pain, or other symptoms that do not improve with treatment.  You have fluid, blood, or pus coming from your toenail. Get help right away if:  You have a red streak on your skin that starts at your foot and spreads up your leg.  You have a fever. Summary  An ingrown toenail occurs when the corner or sides of a toenail grow into the surrounding skin. This causes discomfort and pain. The big toe is most commonly affected, but any of the toes can be affected.  If an ingrown toenail is not treated, it can become infected.  Fluid or pus draining from your toenail is a sign of infection. Your health care provider may need to drain it. You may be given antibiotics to treat the infection.  Trimming your toenails regularly and properly can help you prevent an ingrown toenail. This information is not intended to replace advice given to you by your health care provider. Make sure you discuss any questions you have with your health care provider. Document Revised: 08/17/2018 Document Reviewed: 01/11/2017 Elsevier Patient Education  2021 Elsevier Inc.  

## 2020-08-05 NOTE — Progress Notes (Signed)
Subjective:   Patient ID: Christian Ward, male   DOB: 70 y.o.   MRN: [id number]   HPI Patient states the big toenail left has become increasingly sore over the last 4 weeks and is making it hard for him to wear shoe gear comfortably.  Also has a thickened hallux and second nail right he is concerned about   ROS      Objective:  Physical Exam  Neurovascular status intact with incurvated left hallux medial border painful when pressed inability to wear shoe gear comfortably along with thickened hallux second nail right     Assessment:  Ingrown toenail deformity left hallux with the right being more related to the structure of the nailbeds and probable trauma     Plan:  Patient be discussed both conditions and recommended correction of the left.  I explained procedure risk patient wants surgery understanding risk and I infiltrated the left hallux 60 mg like Marcaine mixture sterile prep done and using sterile instrumentation remove the medial border exposed matrix applied phenol 3 applications 30 seconds followed by alcohol lavage sterile dressing gave instructions on soaks and to wear the dressing for 24 hours but take it off earlier if any throbbing were to occur.  Encouraged to call questions concerns and again reviewed the right 1 and do not recommend current treatment

## 2020-08-09 ENCOUNTER — Other Ambulatory Visit: Payer: Self-pay | Admitting: Internal Medicine

## 2020-08-09 DIAGNOSIS — I10 Essential (primary) hypertension: Secondary | ICD-10-CM

## 2020-08-26 DIAGNOSIS — H00011 Hordeolum externum right upper eyelid: Secondary | ICD-10-CM | POA: Diagnosis not present

## 2020-08-26 DIAGNOSIS — H01001 Unspecified blepharitis right upper eyelid: Secondary | ICD-10-CM | POA: Diagnosis not present

## 2020-08-27 DIAGNOSIS — M545 Low back pain, unspecified: Secondary | ICD-10-CM | POA: Diagnosis not present

## 2020-08-31 DIAGNOSIS — M545 Low back pain, unspecified: Secondary | ICD-10-CM | POA: Diagnosis not present

## 2020-09-08 DIAGNOSIS — B372 Candidiasis of skin and nail: Secondary | ICD-10-CM | POA: Diagnosis not present

## 2020-09-08 DIAGNOSIS — L304 Erythema intertrigo: Secondary | ICD-10-CM | POA: Diagnosis not present

## 2020-09-11 DIAGNOSIS — H2513 Age-related nuclear cataract, bilateral: Secondary | ICD-10-CM | POA: Diagnosis not present

## 2020-09-17 DIAGNOSIS — M545 Low back pain, unspecified: Secondary | ICD-10-CM | POA: Diagnosis not present

## 2020-09-21 DIAGNOSIS — M545 Low back pain, unspecified: Secondary | ICD-10-CM | POA: Diagnosis not present

## 2020-09-30 DIAGNOSIS — M545 Low back pain, unspecified: Secondary | ICD-10-CM | POA: Diagnosis not present

## 2020-10-01 ENCOUNTER — Telehealth: Payer: Self-pay | Admitting: Internal Medicine

## 2020-10-01 NOTE — Chronic Care Management (AMB) (Signed)
  Chronic Care Management   Note  10/01/2020 Name: Christian Ward MRN: [id number] DOB: Jul 03, 1950  Christian Ward is a 70 y.o. year old male who is a primary care patient of Janith Lima, MD. I reached out to Kerin Salen by phone today in response to a referral sent by Christian Ward's PCP, Janith Lima, MD.   Christian Ward was given information about Chronic Care Management services today including:  1. CCM service includes personalized support from designated clinical staff supervised by his physician, including individualized plan of care and coordination with other care providers 2. 24/7 contact phone numbers for assistance for urgent and routine care needs. 3. Service will only be billed when office clinical staff spend 20 minutes or more in a month to coordinate care. 4. Only one practitioner may furnish and bill the service in a calendar month. 5. The patient may stop CCM services at any time (effective at the end of the month) by phone call to the office staff.   Patient agreed to services and verbal consent obtained.   Follow up plan:   Lauretta Grill [employer] Scheduler

## 2020-10-06 DIAGNOSIS — M461 Sacroiliitis, not elsewhere classified: Secondary | ICD-10-CM | POA: Diagnosis not present

## 2020-10-07 DIAGNOSIS — M545 Low back pain, unspecified: Secondary | ICD-10-CM | POA: Diagnosis not present

## 2020-10-22 ENCOUNTER — Other Ambulatory Visit: Payer: Self-pay | Admitting: Internal Medicine

## 2020-10-22 DIAGNOSIS — M545 Low back pain, unspecified: Secondary | ICD-10-CM | POA: Diagnosis not present

## 2020-10-22 DIAGNOSIS — F418 Other specified anxiety disorders: Secondary | ICD-10-CM

## 2020-10-22 NOTE — Telephone Encounter (Signed)
Ok to pcp 

## 2020-10-28 ENCOUNTER — Telehealth: Payer: Medicare Other | Admitting: Family Medicine

## 2020-10-28 ENCOUNTER — Telehealth: Payer: Self-pay | Admitting: Internal Medicine

## 2020-10-28 ENCOUNTER — Ambulatory Visit (INDEPENDENT_AMBULATORY_CARE_PROVIDER_SITE_OTHER): Payer: Medicare Other | Admitting: Family Medicine

## 2020-10-28 ENCOUNTER — Other Ambulatory Visit: Payer: Self-pay

## 2020-10-28 DIAGNOSIS — U071 COVID-19: Secondary | ICD-10-CM | POA: Diagnosis not present

## 2020-10-28 NOTE — Progress Notes (Signed)
Patient ID: Christian Ward, male   DOB: June 23, 1950, 70 y.o.   MRN: [id number]  This visit type was conducted due to national recommendations for restrictions regarding the COVID-19 pandemic in an effort to limit this patient's exposure and mitigate transmission in our community.   Virtual Visit via Telephone Note  I connected with Christian Ward on 10/28/20 at  5:15 PM EDT by telephone and verified that I am speaking with the correct person using two identifiers.   I discussed the limitations, risks, security and privacy concerns of performing an evaluation and management service by telephone and the availability of in person appointments. I also discussed with the patient that there may be a patient responsible charge related to this service. The patient expressed understanding and agreed to proceed.  Location patient: home Location provider: work or home office Participants present for the call: patient, provider Patient did not have a visit in the prior 7 days to address this/these issue(s).   History of Present Illness:  Christian Ward has COVID-19.  He states that last Friday he was with a large gathering of people but felt well over the weekend.  Monday he had some mild sinus congestion.  By Tuesday of this week (yesterday) he developed some chills along with cough.  He did 2 separate COVID test which both came back positive.  These were from different manufacturers.  He started some Sudafed along with Tylenol and felt somewhat better today.  Cough is intermittent.  No dyspnea.  No nausea or vomiting.  Generally fairly healthy.  He has hypertension treated with low-dose irbesartan.  No chronic heart or lung problems.  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 His smoking use included cigarettes. He started smoking about 40 years ago. He has a 5.00 pack-year smoking history. He has never used smokeless tobacco. He reports current alcohol use. He reports that he does not use drugs. family history includes COPD in his father; Cancer in his brother and father; Coronary artery disease in his father; Diabetes in his father and sister; Heart disease in his father; Hypertension in his father; Lung disease in his father and mother; Stroke in his father. Allergies  Allergen Reactions   Tramadol Nausea And Vomiting   Oxycodone Nausea Only      Observations/Objective: Patient sounds cheerful and well on the phone. I do not appreciate any SOB. Speech and thought  processing are grossly intact. Patient reported vitals:  Assessment and Plan:  COVID-19 infection.  Today's day 3 of symptoms.  He feels somewhat better today than yesterday.  Does not have any dyspnea or other acute concerns.  -We discussed antiviral medications.  He specifically had some questions regarding Paxlovid.  We discussed risk/benefit of antiviral therapy.  We did mention some potential drug interactions with Paxlovid as well as necessity to get renal profile prior to use.  Also discussed  Molnupinavir. Given the fact that he feels better today already we have elected to observe for now but if he has any deterioration of symptoms low threshold to start one of the above medications.  -Plenty of fluids and rest -Follow-up immediately for any dyspnea or other concerns -He is aware of CDC recommendations for isolation  Follow Up Instructions:  -As above   99441 5-10 99442 11-20 99443 21-30 I did not refer this patient for an OV in the next 24 hours for this/these issue(s).  I discussed the assessment and treatment plan with the patient. The patient was provided an opportunity to ask questions and all were answered. The patient agreed with the plan and demonstrated an understanding of the instructions.   The patient was advised to call back or seek an in-person evaluation if the symptoms worsen or if the condition fails to improve as anticipated.  I provided 14 minutes of non-face-to-face time during this encounter.   Carolann Littler, MD

## 2020-10-28 NOTE — Telephone Encounter (Signed)
   FYI/ patient tested positive for covid 19 today and he is experiencing cough, congestion and headache. He is scheduled with Dr. Volanda Napoleon this afternoon and wanted to let [name] know.

## 2020-10-31 ENCOUNTER — Other Ambulatory Visit: Payer: Self-pay | Admitting: Internal Medicine

## 2020-10-31 DIAGNOSIS — I10 Essential (primary) hypertension: Secondary | ICD-10-CM

## 2020-11-03 ENCOUNTER — Other Ambulatory Visit: Payer: Self-pay | Admitting: Internal Medicine

## 2020-11-03 ENCOUNTER — Telehealth: Payer: Self-pay | Admitting: Internal Medicine

## 2020-11-03 DIAGNOSIS — J0100 Acute maxillary sinusitis, unspecified: Secondary | ICD-10-CM | POA: Insufficient documentation

## 2020-11-03 MED ORDER — AMOXICILLIN-POT CLAVULANATE 875-125 MG PO TABS: 1.0000 | ORAL_TABLET | Freq: Two times a day (BID) | ORAL | 0 refills | Status: AC

## 2020-11-03 NOTE — Telephone Encounter (Signed)
Covid pos covid on 6.22 Symptoms began on 6.20.22 Video visit with Dr. Elease Hashimoto on 6.22.22  Started feeling better by the weekend, and now yesterday 6.27.22 he started blowing his nose repeatedly, drained into his stomach and now mucous is dark green, Hx of sinusitis In the past [name] has prescribed a z-pac, patient requesting a script for a z-pac for the sinus issues  [id number] [address], [place] - [address] Phone:  [phone]  Fax:  [phone]     Please either call the patient at [phone] Or follow-up with him on my chart so that he knows what to do.

## 2020-11-05 ENCOUNTER — Other Ambulatory Visit: Payer: Self-pay | Admitting: Internal Medicine

## 2020-11-05 DIAGNOSIS — J0100 Acute maxillary sinusitis, unspecified: Secondary | ICD-10-CM

## 2020-11-05 MED ORDER — AZITHROMYCIN 500 MG PO TABS
500.0000 mg | ORAL_TABLET | Freq: Every day | ORAL | 0 refills | Status: AC
Start: 2020-11-05 — End: 2020-11-08

## 2020-11-05 NOTE — Telephone Encounter (Signed)
   Patient called and is requesting a z pack be sent to  [id number] [address], [place] Phone: [phone]  Fax: [phone]

## 2020-11-05 NOTE — Telephone Encounter (Signed)
Pt stated that he would prefer a z-pack sent in due to the sensitivity of the amoxicillin on his stomach. He stated that he has taken the amoxicillin for a day and a half but he made his nauseous and vomit. Please advise if you would be willing to change course of treatment.

## 2020-11-07 DIAGNOSIS — Z20822 Contact with and (suspected) exposure to covid-19: Secondary | ICD-10-CM | POA: Diagnosis not present

## 2020-11-10 ENCOUNTER — Ambulatory Visit: Payer: Medicare Other

## 2020-11-27 DIAGNOSIS — M4696 Unspecified inflammatory spondylopathy, lumbar region: Secondary | ICD-10-CM | POA: Diagnosis not present

## 2020-12-16 DIAGNOSIS — M47896 Other spondylosis, lumbar region: Secondary | ICD-10-CM | POA: Diagnosis not present

## 2020-12-28 DIAGNOSIS — M461 Sacroiliitis, not elsewhere classified: Secondary | ICD-10-CM | POA: Diagnosis not present

## 2021-02-01 DIAGNOSIS — H2513 Age-related nuclear cataract, bilateral: Secondary | ICD-10-CM | POA: Diagnosis not present

## 2021-02-09 ENCOUNTER — Telehealth: Payer: Self-pay | Admitting: Internal Medicine

## 2021-02-09 DIAGNOSIS — M545 Low back pain, unspecified: Secondary | ICD-10-CM | POA: Diagnosis not present

## 2021-02-09 DIAGNOSIS — G8929 Other chronic pain: Secondary | ICD-10-CM | POA: Diagnosis not present

## 2021-02-09 DIAGNOSIS — I10 Essential (primary) hypertension: Secondary | ICD-10-CM

## 2021-02-10 NOTE — Telephone Encounter (Signed)
Patient calling in regarding refill for IRBESARTAN 150 MG TABLET.[name] last OV 02/2020  Offered appt... patient declined & said he would like nurse to call him back  Please call [phone]

## 2021-02-10 NOTE — Telephone Encounter (Signed)
Pt needs an OV as PCP stated as reason for denial.  LOC 02/19/20 & was recommended to follow up in 25mo.

## 2021-02-11 ENCOUNTER — Other Ambulatory Visit: Payer: Self-pay | Admitting: Internal Medicine

## 2021-02-11 DIAGNOSIS — I10 Essential (primary) hypertension: Secondary | ICD-10-CM

## 2021-02-11 MED ORDER — IRBESARTAN 150 MG PO TABS: 150.0000 mg | ORAL_TABLET | Freq: Every day | ORAL | 0 refills | Status: DC

## 2021-02-25 DIAGNOSIS — H2512 Age-related nuclear cataract, left eye: Secondary | ICD-10-CM | POA: Diagnosis not present

## 2021-03-03 ENCOUNTER — Other Ambulatory Visit: Payer: Self-pay | Admitting: Student

## 2021-03-03 DIAGNOSIS — M545 Low back pain, unspecified: Secondary | ICD-10-CM

## 2021-03-09 ENCOUNTER — Ambulatory Visit (INDEPENDENT_AMBULATORY_CARE_PROVIDER_SITE_OTHER): Payer: Medicare Other | Admitting: Internal Medicine

## 2021-03-09 ENCOUNTER — Encounter: Payer: Self-pay | Admitting: Internal Medicine

## 2021-03-09 ENCOUNTER — Other Ambulatory Visit: Payer: Self-pay

## 2021-03-09 VITALS — BP 132/84 | HR 64 | Temp 98.3°F | Ht 76.0 in | Wt 216.0 lb

## 2021-03-09 DIAGNOSIS — C61 Malignant neoplasm of prostate: Secondary | ICD-10-CM | POA: Diagnosis not present

## 2021-03-09 DIAGNOSIS — R739 Hyperglycemia, unspecified: Secondary | ICD-10-CM | POA: Diagnosis not present

## 2021-03-09 DIAGNOSIS — Z0001 Encounter for general adult medical examination with abnormal findings: Secondary | ICD-10-CM | POA: Diagnosis not present

## 2021-03-09 DIAGNOSIS — D539 Nutritional anemia, unspecified: Secondary | ICD-10-CM

## 2021-03-09 DIAGNOSIS — I10 Essential (primary) hypertension: Secondary | ICD-10-CM

## 2021-03-09 DIAGNOSIS — E785 Hyperlipidemia, unspecified: Secondary | ICD-10-CM | POA: Diagnosis not present

## 2021-03-09 DIAGNOSIS — D509 Iron deficiency anemia, unspecified: Secondary | ICD-10-CM

## 2021-03-09 MED ORDER — IRBESARTAN 150 MG PO TABS: 150.0000 mg | ORAL_TABLET | Freq: Every day | ORAL | 0 refills | Status: DC

## 2021-03-09 NOTE — Patient Instructions (Signed)
Health Maintenance, Male Adopting a healthy lifestyle and getting preventive care are important in promoting health and wellness. Ask your health care provider about: The right schedule for you to have regular tests and exams. Things you can do on your own to prevent diseases and keep yourself healthy. What should I know about diet, weight, and exercise? Eat a healthy diet  Eat a diet that includes plenty of vegetables, fruits, low-fat dairy products, and lean protein. Do not eat a lot of foods that are high in solid fats, added sugars, or sodium. Maintain a healthy weight Body mass index (BMI) is a measurement that can be used to identify possible weight problems. It estimates body fat based on height and weight. Your health care provider can help determine your BMI and help you achieve or maintain a healthy weight. Get regular exercise Get regular exercise. This is one of the most important things you can do for your health. Most adults should: Exercise for at least 150 minutes each week. The exercise should increase your heart rate and make you sweat (moderate-intensity exercise). Do strengthening exercises at least twice a week. This is in addition to the moderate-intensity exercise. Spend less time sitting. Even light physical activity can be beneficial. Watch cholesterol and blood lipids Have your blood tested for lipids and cholesterol at 69 years of age, then have this test every 5 years. You may need to have your cholesterol levels checked more often if: Your lipid or cholesterol levels are high. You are older than 69 years of age. You are at high risk for heart disease. What should I know about cancer screening? Many types of cancers can be detected early and may often be prevented. Depending on your health history and family history, you may need to have cancer screening at various ages. This may include screening for: Colorectal cancer. Prostate cancer. Skin cancer. Lung  cancer. What should I know about heart disease, diabetes, and high blood pressure? Blood pressure and heart disease High blood pressure causes heart disease and increases the risk of stroke. This is more likely to develop in people who have high blood pressure readings, are of African descent, or are overweight. Talk with your health care provider about your target blood pressure readings. Have your blood pressure checked: Every 3-5 years if you are 18-39 years of age. Every year if you are 40 years old or older. If you are between the ages of 65 and 75 and are a current or former smoker, ask your health care provider if you should have a one-time screening for abdominal aortic aneurysm (AAA). Diabetes Have regular diabetes screenings. This checks your fasting blood sugar level. Have the screening done: Once every three years after age 45 if you are at a normal weight and have a low risk for diabetes. More often and at a younger age if you are overweight or have a high risk for diabetes. What should I know about preventing infection? Hepatitis B If you have a higher risk for hepatitis B, you should be screened for this virus. Talk with your health care provider to find out if you are at risk for hepatitis B infection. Hepatitis C Blood testing is recommended for: Everyone born from 1945 through 1965. Anyone with known risk factors for hepatitis C. Sexually transmitted infections (STIs) You should be screened each year for STIs, including gonorrhea and chlamydia, if: You are sexually active and are younger than 69 years of age. You are older than 69 years   of age and your health care provider tells you that you are at risk for this type of infection. Your sexual activity has changed since you were last screened, and you are at increased risk for chlamydia or gonorrhea. Ask your health care provider if you are at risk. Ask your health care provider about whether you are at high risk for HIV.  Your health care provider may recommend a prescription medicine to help prevent HIV infection. If you choose to take medicine to prevent HIV, you should first get tested for HIV. You should then be tested every 3 months for as long as you are taking the medicine. Follow these instructions at home: Lifestyle Do not use any products that contain nicotine or tobacco, such as cigarettes, e-cigarettes, and chewing tobacco. If you need help quitting, ask your health care provider. Do not use street drugs. Do not share needles. Ask your health care provider for help if you need support or information about quitting drugs. Alcohol use Do not drink alcohol if your health care provider tells you not to drink. If you drink alcohol: Limit how much you have to 0-2 drinks a day. Be aware of how much alcohol is in your drink. In the [place] one drink equals one 12 oz bottle of beer (355 mL), one 5 oz glass of wine (148 mL), or one 1 oz glass of hard liquor (44 mL). General instructions Schedule regular health, dental, and eye exams. Stay current with your vaccines. Tell your health care provider if: You often feel depressed. You have ever been abused or do not feel safe at home. Summary Adopting a healthy lifestyle and getting preventive care are important in promoting health and wellness. Follow your health care provider's instructions about healthy diet, exercising, and getting tested or screened for diseases. Follow your health care provider's instructions on monitoring your cholesterol and blood pressure. This information is not intended to replace advice given to you by your health care provider. Make sure you discuss any questions you have with your health care provider. Document Revised: 07/03/2020 Document Reviewed: 04/18/2018 Elsevier Patient Education  2022 [employer] Inc.  

## 2021-03-09 NOTE — Progress Notes (Signed)
Subjective:  Patient ID: Christian Ward, male    DOB: 1950/10/09  Age: 70 y.o. MRN: [id number]  CC: Annual Exam, Anemia, Hypertension, and Hyperlipidemia  This visit occurred during the SARS-CoV-2 public health emergency.  Safety protocols were in place, including screening questions prior to the visit, additional usage of staff PPE, and extensive cleaning of exam room while observing appropriate contact time as indicated for disinfecting solutions.    HPI Christian Ward presents for a CPX and f/up -  He saw a urologist 6 weeks ago. He is very active, exercises, and does not experience CP, DOE, fatigue, edema, palpitations, dizziness, or lightheadedness.  Outpatient Medications Prior to Visit  Medication Sig Dispense Refill   acetaminophen (TYLENOL) 500 MG tablet Take 500 mg by mouth every 6 (six) hours as needed for mild pain.     Cholecalciferol (VITAMIN D3) 100000 UNIT/GM POWD Take by mouth.     clonazePAM (KLONOPIN) 0.5 MG tablet TAKE 1 TABLET (0.5 MG TOTAL) BY MOUTH 2 (TWO) TIMES DAILY AS NEEDED FOR ANXIETY. 60 tablet 1   Dietary Management Product (VASCULERA) TABS Take 1 tablet by mouth daily. 30 tablet 11   fluticasone (FLONASE) 50 MCG/ACT nasal spray Place 1 spray into both nostrils daily as needed for allergies.      Magnesium 250 MG TABS Take 250 mg by mouth daily.     omeprazole (PRILOSEC) 20 MG capsule Take 20 mg by mouth 2 (two) times daily as needed (acid reflux). Takes 1 daily and may take a second dose if needed     Probiotic Product (PROBIOTIC DAILY) CAPS Take 1 capsule by mouth daily.     irbesartan (AVAPRO) 150 MG tablet Take 1 tablet (150 mg total) by mouth daily. 90 tablet 0   rosuvastatin (CRESTOR) 5 MG tablet Take 1 tablet (5 mg total) by mouth daily. 90 tablet 1   No facility-administered medications prior to visit.    ROS Review of Systems  Constitutional:  Negative for chills, diaphoresis, fatigue and fever.  HENT: Negative.    Eyes: Negative.    Respiratory:  Negative for chest tightness, shortness of breath and wheezing.   Cardiovascular:  Negative for chest pain, palpitations and leg swelling.  Gastrointestinal:  Negative for abdominal pain, blood in stool, constipation, diarrhea, nausea and vomiting.  Endocrine: Negative.   Genitourinary: Negative.  Negative for difficulty urinating, penile swelling and scrotal swelling.  Musculoskeletal: Negative.  Negative for arthralgias and myalgias.  Skin: Negative.  Negative for color change.  Neurological:  Negative for dizziness, facial asymmetry, weakness, light-headedness, numbness and headaches.  Hematological:  Negative for adenopathy. Does not bruise/bleed easily.  Psychiatric/Behavioral: Negative.     Objective:  BP 132/84 (BP Location: Right Arm, Patient Position: Sitting, Cuff Size: Large)   Pulse 64   Temp 98.3 F (36.8 C) (Oral)   Ht 6\' 4"  (1.93 m)   Wt 216 lb (98 kg)   SpO2 98%   BMI 26.29 kg/m   BP Readings from Last 3 Encounters:  03/09/21 132/84  02/19/20 132/82  11/06/18 (!) 148/84    Wt Readings from Last 3 Encounters:  03/09/21 216 lb (98 kg)  02/19/20 219 lb (99.3 kg)  11/06/18 221 lb (100.2 kg)    Physical Exam Vitals reviewed.  Constitutional:      Appearance: Normal appearance.  HENT:     Nose: Nose normal.     Mouth/Throat:     Mouth: Mucous membranes are moist.  Eyes:  General: No scleral icterus.    Conjunctiva/sclera: Conjunctivae normal.  Cardiovascular:     Rate and Rhythm: Normal rate and regular rhythm.     Heart sounds: No murmur heard. Pulmonary:     Effort: Pulmonary effort is normal.     Breath sounds: No stridor. No wheezing, rhonchi or rales.  Abdominal:     General: Abdomen is flat.     Palpations: There is no mass.     Tenderness: There is no abdominal tenderness. There is no guarding or rebound.     Hernia: No hernia is present.  Musculoskeletal:        General: Normal range of motion.     Cervical back: Neck  supple.     Right lower leg: No edema.     Left lower leg: No edema.  Lymphadenopathy:     Cervical: No cervical adenopathy.  Skin:    General: Skin is warm and dry.     Coloration: Skin is not pale.  Neurological:     General: No focal deficit present.     Mental Status: He is alert.  Psychiatric:        Mood and Affect: Mood normal.        Behavior: Behavior normal.    Lab Results  Component Value Date   WBC 7.1 03/09/2021   HGB 12.7 (L) 03/09/2021   HCT 38.3 (L) 03/09/2021   PLT 222.0 03/09/2021   GLUCOSE 94 03/09/2021   CHOL 161 03/09/2021   TRIG 121.0 03/09/2021   HDL 39.20 03/09/2021   LDLCALC 97 03/09/2021   ALT 20 03/09/2021   AST 22 03/09/2021   NA 140 03/09/2021   K 4.4 03/09/2021   CL 103 03/09/2021   CREATININE 0.97 03/09/2021   BUN 18 03/09/2021   CO2 29 03/09/2021   TSH 1.47 03/09/2021   PSA 1.48 03/09/2021   INR 1.06 08/31/2016   HGBA1C 5.8 03/09/2021    VAS US CAROTID  Result Date: 01/09/2018 Carotid Arterial Duplex Study Indications:  The patient has concerns with 5 weeks of vertigo and a "crackling"               in his left ear. He reported 2 episodes of more intense vertigo               and noted that he has a tendency to veer to the left when walking. Risk Factors: Hypertension, past history of smoking. Performing Technologist: Wilkie Aye RVT  Examination Guidelines: A complete evaluation includes B-mode imaging, spectral Doppler, color Doppler, and power Doppler as needed of all accessible portions of each vessel. Bilateral testing is considered an integral part of a complete examination. Limited examinations for reoccurring indications may be performed as noted.  Right Carotid Findings: +----------+--------+--------+--------+--------+--------+           PSV cm/sEDV cm/sStenosisDescribeComments +----------+--------+--------+--------+--------+--------+ CCA Prox  99      23                                +----------+--------+--------+--------+--------+--------+ CCA Distal62      17                               +----------+--------+--------+--------+--------+--------+ ICA Prox  70      25      Normal                   +----------+--------+--------+--------+--------+--------+  ICA Mid   68      25                               +----------+--------+--------+--------+--------+--------+ ICA Distal75      30                               +----------+--------+--------+--------+--------+--------+ ECA       92      18                               +----------+--------+--------+--------+--------+--------+ +----------+--------+-------+----------------+-------------------+           PSV cm/sEDV cmsDescribe        Arm Pressure (mmHG) +----------+--------+-------+----------------+-------------------+ Subclavian130            Multiphasic, [id number]                 +----------+--------+-------+----------------+-------------------+ +---------+--------+--+--------+--+---------+ VertebralPSV cm/s46EDV cm/s16Antegrade +---------+--------+--+--------+--+---------+  Left Carotid Findings: +----------+--------+--------+--------+--------+--------+           PSV cm/sEDV cm/sStenosisDescribeComments +----------+--------+--------+--------+--------+--------+ CCA Prox  117     17                               +----------+--------+--------+--------+--------+--------+ CCA Distal51      15                               +----------+--------+--------+--------+--------+--------+ ICA Prox  72      22      Normal                   +----------+--------+--------+--------+--------+--------+ ICA Mid   76      27                               +----------+--------+--------+--------+--------+--------+ ICA Distal80      29                               +----------+--------+--------+--------+--------+--------+ ECA       74      12                                +----------+--------+--------+--------+--------+--------+ +----------+--------+--------+----------------+-------------------+ SubclavianPSV cm/sEDV cm/sDescribe        Arm Pressure (mmHG) +----------+--------+--------+----------------+-------------------+           149             Multiphasic, [id number]                 +----------+--------+--------+----------------+-------------------+ +---------+--------+--+--------+-+---------+ VertebralPSV cm/s45EDV cm/s6Antegrade +---------+--------+--+--------+-+---------+  Final Interpretation: Right Carotid: There was no evidence of thrombus, dissection, atherosclerotic                plaque or stenosis in the cervical carotid system. Left Carotid: There was no evidence of thrombus, dissection, atherosclerotic               plaque or stenosis in the cervical carotid system. Vertebrals:  Bilateral vertebral arteries demonstrate antegrade flow. Subclavians: Normal flow hemodynamics were seen in bilateral subclavian  arteries. *See table(s) above for measurements and observations.  Electronically signed by Jenkins Rouge MD on 01/09/2018 at 9:15:29 PM.    Final     Assessment & Plan:   Christian Ward was seen today for annual exam, anemia, hypertension and hyperlipidemia.  Diagnoses and all orders for this visit:  Essential hypertension- His BP is adequately well controled. -     CBC with Differential/Platelet; Future -     TSH; Future -     Urinalysis, Routine w reflex microscopic; Future -     Hepatic function panel; Future -     Hepatic function panel -     Urinalysis, Routine w reflex microscopic -     TSH -     CBC with Differential/Platelet -     irbesartan (AVAPRO) 150 MG tablet; Take 1 tablet (150 mg total) by mouth daily.  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 -     PSA; Future -     PSA  Dyslipidemia, goal LDL below 100- LDL goal achieved. Doing well on the statin  -     Lipid panel; Future -     Lipid  panel -     rosuvastatin (CRESTOR) 5 MG tablet; Take 1 tablet (5 mg total) by mouth daily.  Chronic hyperglycemia- His A1C is normal. -     Basic metabolic panel; Future -     Hemoglobin A1c; Future -     Hemoglobin A1c -     Basic metabolic panel  Deficiency anemia- His iron level is low. -     CBC with Differential/Platelet; Future -     Vitamin B12; Future -     Folate; Future -     IBC + Ferritin; Future -     Vitamin B1; Future -     Vitamin B1 -     IBC + Ferritin -     Folate -     Vitamin B12 -     CBC with Differential/Platelet  Iron deficiency anemia, unspecified iron deficiency anemia type- Will start oral iron. I have asked him to see GI to be screened for GI sources of blood loss. -     Ambulatory referral to Gastroenterology -     Ferric Maltol (ACCRUFER) 30 MG CAPS; Take 1 capsule by mouth in the morning and at bedtime.  Encounter for general adult medical examination with abnormal findings- Exam completed, labs reviewed, he refused a flu vax, cancer screenings addressed. Pt ed material was give.  I am having Christian Ward start on [place]. I am also having him maintain his omeprazole, Probiotic Daily, fluticasone, Magnesium, acetaminophen, Vitamin D3, Vasculera, clonazePAM, irbesartan, and rosuvastatin.  Meds ordered this encounter  Medications   irbesartan (AVAPRO) 150 MG tablet    Sig: Take 1 tablet (150 mg total) by mouth daily.    Dispense:  90 tablet    Refill:  0   Ferric Maltol (ACCRUFER) 30 MG CAPS    Sig: Take 1 capsule by mouth in the morning and at bedtime.    Dispense:  180 capsule    Refill:  0   rosuvastatin (CRESTOR) 5 MG tablet    Sig: Take 1 tablet (5 mg total) by mouth daily.    Dispense:  90 tablet    Refill:  1     Follow-up: Return in about 6 months (around 09/06/2021).  Scarlette Calico, MD

## 2021-03-10 DIAGNOSIS — D509 Iron deficiency anemia, unspecified: Secondary | ICD-10-CM | POA: Insufficient documentation

## 2021-03-10 DIAGNOSIS — Z0001 Encounter for general adult medical examination with abnormal findings: Secondary | ICD-10-CM | POA: Insufficient documentation

## 2021-03-10 LAB — URINALYSIS, ROUTINE W REFLEX MICROSCOPIC
Bilirubin Urine: NEGATIVE
Hgb urine dipstick: NEGATIVE
Ketones, ur: NEGATIVE
Leukocytes,Ua: NEGATIVE
Nitrite: NEGATIVE
RBC / HPF: NONE SEEN (ref 0–?)
Specific Gravity, Urine: 1.01 (ref 1.000–1.030)
Total Protein, Urine: NEGATIVE
Urine Glucose: NEGATIVE
Urobilinogen, UA: 0.2 (ref 0.0–1.0)
WBC, UA: NONE SEEN (ref 0–?)
pH: 6.5 (ref 5.0–8.0)

## 2021-03-10 LAB — HEPATIC FUNCTION PANEL
ALT: 20 U/L (ref 0–53)
AST: 22 U/L (ref 0–37)
Albumin: 4.2 g/dL (ref 3.5–5.2)
Alkaline Phosphatase: 101 U/L (ref 39–117)
Bilirubin, Direct: 0.2 mg/dL (ref 0.0–0.3)
Total Bilirubin: 0.9 mg/dL (ref 0.2–1.2)
Total Protein: 6.5 g/dL (ref 6.0–8.3)

## 2021-03-10 LAB — CBC WITH DIFFERENTIAL/PLATELET
Basophils Absolute: 0 10*3/uL (ref 0.0–0.1)
Basophils Relative: 0.6 % (ref 0.0–3.0)
Eosinophils Absolute: 0.3 10*3/uL (ref 0.0–0.7)
Eosinophils Relative: 4.8 % (ref 0.0–5.0)
HCT: 38.3 % — ABNORMAL LOW (ref 39.0–52.0)
Hemoglobin: 12.7 g/dL — ABNORMAL LOW (ref 13.0–17.0)
Lymphocytes Relative: 14.2 % (ref 12.0–46.0)
Lymphs Abs: 1 10*3/uL (ref 0.7–4.0)
MCHC: 33 g/dL (ref 30.0–36.0)
MCV: 92.9 fl (ref 78.0–100.0)
Monocytes Absolute: 0.7 10*3/uL (ref 0.1–1.0)
Monocytes Relative: 10.4 % (ref 3.0–12.0)
Neutro Abs: 5 10*3/uL (ref 1.4–7.7)
Neutrophils Relative %: 70 % (ref 43.0–77.0)
Platelets: 222 10*3/uL (ref 150.0–400.0)
RBC: 4.12 Mil/uL — ABNORMAL LOW (ref 4.22–5.81)
RDW: 13.4 % (ref 11.5–15.5)
WBC: 7.1 10*3/uL (ref 4.0–10.5)

## 2021-03-10 LAB — BASIC METABOLIC PANEL
BUN: 18 mg/dL (ref 6–23)
CO2: 29 mEq/L (ref 19–32)
Calcium: 9.2 mg/dL (ref 8.4–10.5)
Chloride: 103 mEq/L (ref 96–112)
Creatinine, Ser: 0.97 mg/dL (ref 0.40–1.50)
GFR: 79.56 mL/min (ref >60.00)
Glucose, Bld: 94 mg/dL (ref 70–99)
Potassium: 4.4 mEq/L (ref 3.5–5.1)
Sodium: 140 mEq/L (ref 135–145)

## 2021-03-10 LAB — TSH: TSH: 1.47 u[IU]/mL (ref 0.35–5.50)

## 2021-03-10 LAB — LIPID PANEL
Cholesterol: 161 mg/dL (ref 0–200)
HDL: 39.2 mg/dL (ref >39.00)
LDL Cholesterol: 97 mg/dL (ref 0–99)
NonHDL: 121.69
Total CHOL/HDL Ratio: 4
Triglycerides: 121 mg/dL (ref 0.0–149.0)
VLDL: 24.2 mg/dL (ref 0.0–40.0)

## 2021-03-10 LAB — IBC + FERRITIN
Ferritin: 96.1 ng/mL (ref 22.0–322.0)
Iron: 47 ug/dL (ref 42–165)
Saturation Ratios: 14 % — ABNORMAL LOW (ref 20.0–50.0)
TIBC: 336 ug/dL (ref 250.0–450.0)
Transferrin: 240 mg/dL (ref 212.0–360.0)

## 2021-03-10 LAB — FOLATE: Folate: >23.4 ng/mL (ref >5.9)

## 2021-03-10 LAB — PSA: PSA: 1.48 ng/mL (ref 0.10–4.00)

## 2021-03-10 LAB — HEMOGLOBIN A1C: Hgb A1c MFr Bld: 5.8 % (ref 4.6–6.5)

## 2021-03-10 LAB — VITAMIN B12: Vitamin B-12: 444 pg/mL (ref 211–911)

## 2021-03-10 MED ORDER — ROSUVASTATIN CALCIUM 5 MG PO TABS: 5.0000 mg | ORAL_TABLET | Freq: Every day | ORAL | 1 refills | Status: DC

## 2021-03-10 MED ORDER — ACCRUFER 30 MG PO CAPS: 1.0000 | ORAL_CAPSULE | Freq: Two times a day (BID) | ORAL | 0 refills | Status: DC

## 2021-03-11 DIAGNOSIS — H2511 Age-related nuclear cataract, right eye: Secondary | ICD-10-CM | POA: Diagnosis not present

## 2021-03-14 LAB — VITAMIN B1: Vitamin B1 (Thiamine): 47 nmol/L — ABNORMAL HIGH (ref 8–30)

## 2021-03-23 DIAGNOSIS — M461 Sacroiliitis, not elsewhere classified: Secondary | ICD-10-CM | POA: Diagnosis not present

## 2021-03-30 ENCOUNTER — Telehealth: Payer: Self-pay | Admitting: Internal Medicine

## 2021-03-30 NOTE — Telephone Encounter (Signed)
Patient requesting a call back to discuss lab results and GI referral  Patient requesting a mailed copy of lab results

## 2021-03-30 NOTE — Telephone Encounter (Signed)
Pt has been informed of results and will reach out to GI to get schedule for evaluation.   Pt requested that hard copy be sent via mail.

## 2021-04-04 ENCOUNTER — Other Ambulatory Visit: Payer: Self-pay

## 2021-04-04 ENCOUNTER — Ambulatory Visit
Admission: RE | Admit: 2021-04-04 | Discharge: 2021-04-04 | Disposition: A | Discharge status: Home / Self Care | Payer: Medicare Other | Source: Ambulatory Visit | Attending: Student | Admitting: Student

## 2021-04-04 DIAGNOSIS — M545 Low back pain, unspecified: Secondary | ICD-10-CM

## 2021-04-12 NOTE — Telephone Encounter (Signed)
November labs have beem printed and mailed again.

## 2021-04-12 NOTE — Telephone Encounter (Signed)
Patient states he has not received a hard copy of lab results  Patient requesting a hard copy mailed   Address [address] [place] [address]

## 2021-04-13 ENCOUNTER — Telehealth (INDEPENDENT_AMBULATORY_CARE_PROVIDER_SITE_OTHER): Payer: Medicare Other | Admitting: Family Medicine

## 2021-04-13 ENCOUNTER — Encounter: Payer: Self-pay | Admitting: Family Medicine

## 2021-04-13 ENCOUNTER — Other Ambulatory Visit: Payer: Self-pay

## 2021-04-13 VITALS — Temp 97.1°F

## 2021-04-13 DIAGNOSIS — R059 Cough, unspecified: Secondary | ICD-10-CM | POA: Diagnosis not present

## 2021-04-13 DIAGNOSIS — R519 Headache, unspecified: Secondary | ICD-10-CM

## 2021-04-13 DIAGNOSIS — R0981 Nasal congestion: Secondary | ICD-10-CM

## 2021-04-13 MED ORDER — AZITHROMYCIN 250 MG PO TABS: ORAL_TABLET | ORAL | 0 refills | Status: DC

## 2021-04-13 MED ORDER — BENZONATATE 100 MG PO CAPS: 100.0000 mg | ORAL_CAPSULE | Freq: Three times a day (TID) | ORAL | 0 refills | Status: DC | PRN

## 2021-04-13 NOTE — Progress Notes (Signed)
Virtual Visit via Telephone Note  I connected with Christian Ward on 04/13/21 at  3:40 PM EST by telephone and verified that I am speaking with the correct person using two identifiers.   I discussed the limitations, risks, security and privacy concerns of performing an evaluation and management service by telephone and the availability of in person appointments. I also discussed with the patient that there may be a patient responsible charge related to this service. The patient expressed understanding and agreed to proceed.  Location patient: home, [place] Location provider: work or home office Participants present for the call: patient, provider Patient did not have a visit with me in the prior 7 days to address this/these issue(s).   History of Present Illness:  Acute telemedicine visit for sinus congestion: -Onset: 8-9 days ago and was improving then worsened the last few days -Symptoms include: now with sinus pressure, head congestion, dark green nasal drainage x 1 days, cough, chills the last few days, fever -reports get this almost every year and it turns into a sinus infection and reports a zpack usually "knocks this out" -tested negative for covid -Denies: CP, SOB, NVD -Has tried:OTC cough medication -Pertinent past medical history: see below -Pertinent medication allergies:  Allergies  Allergen Reactions   Tramadol Nausea And Vomiting   Oxycodone Nausea Only  -COVID-19 vaccine status: Immunization History  Administered Date(s) Administered   Fluad Quad(high Dose 65+) 05/01/2019   Influenza, High Dose Seasonal PF 06/12/2018   PFIZER(Purple Top)SARS-COV-2 Vaccination 05/31/2019, 06/20/2019, 04/24/2020   Pneumococcal Conjugate-13 01/31/2017   Pneumococcal Polysaccharide-23 07/23/2018   Tdap 07/23/2018   Past Medical History:  Diagnosis Date   Acid reflux    Cancer (HCC)    Chronic recurrent bronchiolitis (HCC)    Complication of anesthesia    trouble waking up    Fibromyalgia    H/O infectious mononucleosis    History of shingles    Hypercholesterolemia    IBS (irritable bowel syndrome)    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 I do not appreciate any SOB. Speech and thought processing are grossly intact. Patient reported vitals:  Assessment and Plan:  Nasal congestion  Cough, unspecified type  Facial discomfort  -we discussed possible serious and likely etiologies, options for evaluation and workup, limitations of telemedicine visit vs in person visit, treatment, treatment risks and precautions. Pt prefers to treat via telemedicine empirically rather than in person at this moment. Given was improving, then worsening with query potential bacterial sinusitis vs ongoing viral illness vs other He is requesting a zpack - discussed risks/benefits and indications for abx, typical abx regimens. Opted for nasal saline, tessalon for cough and zpack if needed.  Advised to seek prompt in person care if worsening, new symptoms arise, or if is not improving with treatment. Advised of options for inperson care in case PCP office not available. Did let the patient know that I only do telemedicine shifts for [place] on Tuesdays and Thursdays and advised a follow up visit with PCP or at an [facility] if has further questions or concerns.   Follow Up Instructions:  I did not refer this patient for an OV with me in the next 24 hours for this/these issue(s).  I discussed the assessment and treatment plan with the patient. The patient was provided an opportunity to ask questions and  all were answered. The patient agreed with the plan and demonstrated an understanding of the instructions.   I spent 22 minutes on the date of this visit in the care of this patient. See summary of tasks completed to properly care for this patient in the  detailed notes above which also included counseling of above, review of PMH, medications, allergies, evaluation of the patient and ordering and/or  instructing patient on testing and care options.     Lucretia Kern, DO

## 2021-04-13 NOTE — Patient Instructions (Addendum)
-  nasal saline twice daily  -I sent the medication(s) we discussed to your pharmacy: Meds ordered this encounter  Medications   benzonatate (TESSALON PERLES) 100 MG capsule    Sig: Take 1 capsule (100 mg total) by mouth 3 (three) times daily as needed.    Dispense:  30 capsule    Refill:  0   azithromycin (ZITHROMAX) 250 MG tablet    Sig: 2 tabs day 1, then one tab daily    Dispense:  6 tablet    Refill:  0    I hope you are feeling better soon!  Seek in person care promptly if your symptoms worsen, new concerns arise or you are not improving with treatment.  It was nice to meet you today. I help [facility] out with telemedicine visits on Tuesdays and Thursdays and am available for visits on those days. If you have any concerns or questions following this visit please schedule a follow up visit with your Primary Care doctor or seek care at a local urgent care clinic to avoid delays in care.

## 2021-04-20 DIAGNOSIS — M545 Low back pain, unspecified: Secondary | ICD-10-CM | POA: Diagnosis not present

## 2021-04-20 DIAGNOSIS — I10 Essential (primary) hypertension: Secondary | ICD-10-CM | POA: Diagnosis not present

## 2021-07-01 DIAGNOSIS — M461 Sacroiliitis, not elsewhere classified: Secondary | ICD-10-CM | POA: Diagnosis not present

## 2021-07-24 ENCOUNTER — Other Ambulatory Visit: Payer: Self-pay | Admitting: Internal Medicine

## 2021-07-24 DIAGNOSIS — I10 Essential (primary) hypertension: Secondary | ICD-10-CM

## 2021-09-16 DIAGNOSIS — H903 Sensorineural hearing loss, bilateral: Secondary | ICD-10-CM | POA: Diagnosis not present

## 2021-09-16 DIAGNOSIS — J342 Deviated nasal septum: Secondary | ICD-10-CM | POA: Diagnosis not present

## 2021-09-16 DIAGNOSIS — H6123 Impacted cerumen, bilateral: Secondary | ICD-10-CM | POA: Diagnosis not present

## 2021-09-16 DIAGNOSIS — R42 Dizziness and giddiness: Secondary | ICD-10-CM | POA: Diagnosis not present

## 2021-09-16 DIAGNOSIS — J310 Chronic rhinitis: Secondary | ICD-10-CM | POA: Diagnosis not present

## 2021-09-16 DIAGNOSIS — J343 Hypertrophy of nasal turbinates: Secondary | ICD-10-CM | POA: Diagnosis not present

## 2021-09-16 DIAGNOSIS — R0982 Postnasal drip: Secondary | ICD-10-CM | POA: Diagnosis not present

## 2021-09-24 ENCOUNTER — Telehealth: Payer: Self-pay | Admitting: *Deleted

## 2021-09-24 DIAGNOSIS — M5451 Vertebrogenic low back pain: Secondary | ICD-10-CM | POA: Diagnosis not present

## 2021-09-24 DIAGNOSIS — M47896 Other spondylosis, lumbar region: Secondary | ICD-10-CM | POA: Diagnosis not present

## 2021-09-24 NOTE — Telephone Encounter (Signed)
Called patient to schedule an Medicare AWV. Patient declined scheduling at this time

## 2021-09-28 DIAGNOSIS — H02831 Dermatochalasis of right upper eyelid: Secondary | ICD-10-CM | POA: Diagnosis not present

## 2021-09-28 DIAGNOSIS — H04123 Dry eye syndrome of bilateral lacrimal glands: Secondary | ICD-10-CM | POA: Diagnosis not present

## 2021-09-28 DIAGNOSIS — H43812 Vitreous degeneration, left eye: Secondary | ICD-10-CM | POA: Diagnosis not present

## 2021-09-28 DIAGNOSIS — H26492 Other secondary cataract, left eye: Secondary | ICD-10-CM | POA: Diagnosis not present

## 2021-10-05 DIAGNOSIS — M461 Sacroiliitis, not elsewhere classified: Secondary | ICD-10-CM | POA: Diagnosis not present

## 2021-10-07 LAB — PSA: PSA: 1.7

## 2021-10-12 DIAGNOSIS — H02831 Dermatochalasis of right upper eyelid: Secondary | ICD-10-CM | POA: Diagnosis not present

## 2021-10-12 DIAGNOSIS — H04123 Dry eye syndrome of bilateral lacrimal glands: Secondary | ICD-10-CM | POA: Diagnosis not present

## 2021-10-12 DIAGNOSIS — H02834 Dermatochalasis of left upper eyelid: Secondary | ICD-10-CM | POA: Diagnosis not present

## 2021-10-12 DIAGNOSIS — H43812 Vitreous degeneration, left eye: Secondary | ICD-10-CM | POA: Diagnosis not present

## 2021-10-31 ENCOUNTER — Other Ambulatory Visit: Payer: Self-pay | Admitting: Internal Medicine

## 2021-10-31 DIAGNOSIS — I10 Essential (primary) hypertension: Secondary | ICD-10-CM

## 2021-11-10 DIAGNOSIS — M47816 Spondylosis without myelopathy or radiculopathy, lumbar region: Secondary | ICD-10-CM | POA: Diagnosis not present

## 2021-12-09 DIAGNOSIS — R42 Dizziness and giddiness: Secondary | ICD-10-CM | POA: Diagnosis not present

## 2021-12-16 DIAGNOSIS — R42 Dizziness and giddiness: Secondary | ICD-10-CM | POA: Diagnosis not present

## 2021-12-16 DIAGNOSIS — R0982 Postnasal drip: Secondary | ICD-10-CM | POA: Diagnosis not present

## 2021-12-16 DIAGNOSIS — J310 Chronic rhinitis: Secondary | ICD-10-CM | POA: Diagnosis not present

## 2022-01-04 DIAGNOSIS — M461 Sacroiliitis, not elsewhere classified: Secondary | ICD-10-CM | POA: Diagnosis not present

## 2022-01-23 ENCOUNTER — Other Ambulatory Visit: Payer: Self-pay | Admitting: Internal Medicine

## 2022-01-23 DIAGNOSIS — I10 Essential (primary) hypertension: Secondary | ICD-10-CM

## 2022-01-24 DIAGNOSIS — M47816 Spondylosis without myelopathy or radiculopathy, lumbar region: Secondary | ICD-10-CM | POA: Diagnosis not present

## 2022-01-26 ENCOUNTER — Telehealth: Payer: Self-pay | Admitting: Internal Medicine

## 2022-01-26 DIAGNOSIS — I10 Essential (primary) hypertension: Secondary | ICD-10-CM

## 2022-01-28 DIAGNOSIS — H0014 Chalazion left upper eyelid: Secondary | ICD-10-CM | POA: Diagnosis not present

## 2022-01-31 ENCOUNTER — Other Ambulatory Visit: Payer: Self-pay | Admitting: Internal Medicine

## 2022-01-31 DIAGNOSIS — I10 Essential (primary) hypertension: Secondary | ICD-10-CM

## 2022-01-31 MED ORDER — IRBESARTAN 150 MG PO TABS: 150.0000 mg | ORAL_TABLET | Freq: Every day | ORAL | 0 refills | Status: DC

## 2022-01-31 NOTE — Telephone Encounter (Signed)
Patient is scheduled for 11/1 for an annual physical and will need refills to last until appt.

## 2022-02-18 ENCOUNTER — Telehealth: Payer: Self-pay | Admitting: Internal Medicine

## 2022-02-18 NOTE — Telephone Encounter (Signed)
Good Afternoon Dr. Henrene Pastor,   Patient called stating that he was recommended by his PCP [name] and also his previous gastro provider Dr. Earlean Shawl to become a patient of yours. Patient is wanting to transfer his care to you due to Dr. Earlean Shawl retiring. Patient was last seen by Dr. Earlean Shawl in 2020 and also had his last colonoscopy in 2015. Patient is just wanting to establish care  and will soon be due for a colonoscopy. Patients records are in epic, will you please review and advise on scheduling patient?   Thank you

## 2022-02-21 NOTE — Telephone Encounter (Signed)
Ok to establish care with me. Thanks

## 2022-02-25 NOTE — Telephone Encounter (Signed)
LVM for patient to call back to schedule OV.  

## 2022-02-28 DIAGNOSIS — M47816 Spondylosis without myelopathy or radiculopathy, lumbar region: Secondary | ICD-10-CM | POA: Diagnosis not present

## 2022-03-04 ENCOUNTER — Encounter: Payer: Self-pay | Admitting: Internal Medicine

## 2022-03-09 ENCOUNTER — Encounter: Payer: Self-pay | Admitting: Internal Medicine

## 2022-03-09 ENCOUNTER — Ambulatory Visit (INDEPENDENT_AMBULATORY_CARE_PROVIDER_SITE_OTHER): Payer: Medicare Other | Admitting: Internal Medicine

## 2022-03-09 VITALS — BP 150/86 | HR 60 | Temp 98.3°F | Resp 16 | Ht 76.0 in | Wt 217.0 lb

## 2022-03-09 DIAGNOSIS — D509 Iron deficiency anemia, unspecified: Secondary | ICD-10-CM | POA: Diagnosis not present

## 2022-03-09 DIAGNOSIS — Z0001 Encounter for general adult medical examination with abnormal findings: Secondary | ICD-10-CM

## 2022-03-09 DIAGNOSIS — E785 Hyperlipidemia, unspecified: Secondary | ICD-10-CM

## 2022-03-09 DIAGNOSIS — C61 Malignant neoplasm of prostate: Secondary | ICD-10-CM

## 2022-03-09 DIAGNOSIS — I10 Essential (primary) hypertension: Secondary | ICD-10-CM

## 2022-03-09 DIAGNOSIS — K219 Gastro-esophageal reflux disease without esophagitis: Secondary | ICD-10-CM | POA: Diagnosis not present

## 2022-03-09 DIAGNOSIS — K5900 Constipation, unspecified: Secondary | ICD-10-CM

## 2022-03-09 DIAGNOSIS — K279 Peptic ulcer, site unspecified, unspecified as acute or chronic, without hemorrhage or perforation: Secondary | ICD-10-CM | POA: Insufficient documentation

## 2022-03-09 DIAGNOSIS — I872 Venous insufficiency (chronic) (peripheral): Secondary | ICD-10-CM | POA: Diagnosis not present

## 2022-03-09 DIAGNOSIS — D539 Nutritional anemia, unspecified: Secondary | ICD-10-CM | POA: Insufficient documentation

## 2022-03-09 DIAGNOSIS — Z23 Encounter for immunization: Secondary | ICD-10-CM | POA: Diagnosis not present

## 2022-03-09 DIAGNOSIS — R0609 Other forms of dyspnea: Secondary | ICD-10-CM | POA: Diagnosis not present

## 2022-03-09 LAB — BASIC METABOLIC PANEL
BUN: 18 mg/dL (ref 6–23)
CO2: 31 mEq/L (ref 19–32)
Calcium: 9.1 mg/dL (ref 8.4–10.5)
Chloride: 104 mEq/L (ref 96–112)
Creatinine, Ser: 0.95 mg/dL (ref 0.40–1.50)
GFR: 81.01 mL/min (ref >60.00)
Glucose, Bld: 91 mg/dL (ref 70–99)
Potassium: 4.1 mEq/L (ref 3.5–5.1)
Sodium: 138 mEq/L (ref 135–145)

## 2022-03-09 LAB — IBC + FERRITIN
Ferritin: 72.6 ng/mL (ref 22.0–322.0)
Iron: 91 ug/dL (ref 42–165)
Saturation Ratios: 28.1 % (ref 20.0–50.0)
TIBC: 323.4 ug/dL (ref 250.0–450.0)
Transferrin: 231 mg/dL (ref 212.0–360.0)

## 2022-03-09 LAB — CBC WITH DIFFERENTIAL/PLATELET
Basophils Absolute: 0 10*3/uL (ref 0.0–0.1)
Basophils Relative: 0.6 % (ref 0.0–3.0)
Eosinophils Absolute: 0.2 10*3/uL (ref 0.0–0.7)
Eosinophils Relative: 3 % (ref 0.0–5.0)
HCT: 36.5 % — ABNORMAL LOW (ref 39.0–52.0)
Hemoglobin: 12.2 g/dL — ABNORMAL LOW (ref 13.0–17.0)
Lymphocytes Relative: 24.2 % (ref 12.0–46.0)
Lymphs Abs: 1.6 10*3/uL (ref 0.7–4.0)
MCHC: 33.4 g/dL (ref 30.0–36.0)
MCV: 91.8 fl (ref 78.0–100.0)
Monocytes Absolute: 0.7 10*3/uL (ref 0.1–1.0)
Monocytes Relative: 10.7 % (ref 3.0–12.0)
Neutro Abs: 4 10*3/uL (ref 1.4–7.7)
Neutrophils Relative %: 61.5 % (ref 43.0–77.0)
Platelets: 225 10*3/uL (ref 150.0–400.0)
RBC: 3.98 Mil/uL — ABNORMAL LOW (ref 4.22–5.81)
RDW: 14 % (ref 11.5–15.5)
WBC: 6.5 10*3/uL (ref 4.0–10.5)

## 2022-03-09 LAB — URINALYSIS, ROUTINE W REFLEX MICROSCOPIC
Bilirubin Urine: NEGATIVE
Hgb urine dipstick: NEGATIVE
Ketones, ur: NEGATIVE
Leukocytes,Ua: NEGATIVE
Nitrite: NEGATIVE
RBC / HPF: NONE SEEN (ref 0–?)
Specific Gravity, Urine: 1.01 (ref 1.000–1.030)
Total Protein, Urine: NEGATIVE
Urine Glucose: NEGATIVE
Urobilinogen, UA: 1 (ref 0.0–1.0)
WBC, UA: NONE SEEN (ref 0–?)
pH: 7 (ref 5.0–8.0)

## 2022-03-09 LAB — HEPATIC FUNCTION PANEL
ALT: 19 U/L (ref 0–53)
AST: 21 U/L (ref 0–37)
Albumin: 4.1 g/dL (ref 3.5–5.2)
Alkaline Phosphatase: 85 U/L (ref 39–117)
Bilirubin, Direct: 0.2 mg/dL (ref 0.0–0.3)
Total Bilirubin: 0.9 mg/dL (ref 0.2–1.2)
Total Protein: 6.7 g/dL (ref 6.0–8.3)

## 2022-03-09 LAB — LIPID PANEL
Cholesterol: 143 mg/dL (ref 0–200)
HDL: 37.4 mg/dL — ABNORMAL LOW (ref >39.00)
LDL Cholesterol: 88 mg/dL (ref 0–99)
NonHDL: 105.8
Total CHOL/HDL Ratio: 4
Triglycerides: 89 mg/dL (ref 0.0–149.0)
VLDL: 17.8 mg/dL (ref 0.0–40.0)

## 2022-03-09 LAB — MAGNESIUM: Magnesium: 1.9 mg/dL (ref 1.5–2.5)

## 2022-03-09 LAB — TSH: TSH: 1.32 u[IU]/mL (ref 0.35–5.50)

## 2022-03-09 MED ORDER — ROSUVASTATIN CALCIUM 10 MG PO TABS: 10.0000 mg | ORAL_TABLET | Freq: Every day | ORAL | 1 refills | Status: DC

## 2022-03-09 MED ORDER — VASCULERA PO TABS: 1.0000 | ORAL_TABLET | Freq: Every day | ORAL | 11 refills | Status: DC

## 2022-03-09 MED ORDER — OMEPRAZOLE 20 MG PO CPDR: 20.0000 mg | DELAYED_RELEASE_CAPSULE | Freq: Two times a day (BID) | ORAL | 1 refills | Status: DC

## 2022-03-09 MED ORDER — IRBESARTAN 150 MG PO TABS: 150.0000 mg | ORAL_TABLET | Freq: Every day | ORAL | 0 refills | Status: DC

## 2022-03-09 NOTE — Patient Instructions (Signed)
Hypertension, Adult High blood pressure (hypertension) is when the force of blood pumping through the arteries is too strong. The arteries are the blood vessels that carry blood from the heart throughout the body. Hypertension forces the heart to work harder to pump blood and may cause arteries to become narrow or stiff. Untreated or uncontrolled hypertension can lead to a heart attack, heart failure, a stroke, kidney disease, and other problems. A blood pressure reading consists of a higher number over a lower number. Ideally, your blood pressure should be below 120/80. The first ("top") number is called the systolic pressure. It is a measure of the pressure in your arteries as your heart beats. The second ("bottom") number is called the diastolic pressure. It is a measure of the pressure in your arteries as the heart relaxes. What are the causes? The exact cause of this condition is not known. There are some conditions that result in high blood pressure. What increases the risk? Certain factors may make you more likely to develop high blood pressure. Some of these risk factors are under your control, including: Smoking. Not getting enough exercise or physical activity. Being overweight. Having too much fat, sugar, calories, or salt (sodium) in your diet. Drinking too much alcohol. Other risk factors include: Having a personal history of heart disease, diabetes, high cholesterol, or kidney disease. Stress. Having a family history of high blood pressure and high cholesterol. Having obstructive sleep apnea. Age. The risk increases with age. What are the signs or symptoms? High blood pressure may not cause symptoms. Very high blood pressure (hypertensive crisis) may cause: Headache. Fast or irregular heartbeats (palpitations). Shortness of breath. Nosebleed. Nausea and vomiting. Vision changes. Severe chest pain, dizziness, and seizures. How is this diagnosed? This condition is diagnosed by  measuring your blood pressure while you are seated, with your arm resting on a flat surface, your legs uncrossed, and your feet flat on the floor. The cuff of the blood pressure monitor will be placed directly against the skin of your upper arm at the level of your heart. Blood pressure should be measured at least twice using the same arm. Certain conditions can cause a difference in blood pressure between your right and left arms. If you have a high blood pressure reading during one visit or you have normal blood pressure with other risk factors, you may be asked to: Return on a different day to have your blood pressure checked again. Monitor your blood pressure at home for 1 week or longer. If you are diagnosed with hypertension, you may have other blood or imaging tests to help your health care provider understand your overall risk for other conditions. How is this treated? This condition is treated by making healthy lifestyle changes, such as eating healthy foods, exercising more, and reducing your alcohol intake. You may be referred for counseling on a healthy diet and physical activity. Your health care provider may prescribe medicine if lifestyle changes are not enough to get your blood pressure under control and if: Your systolic blood pressure is above 130. Your diastolic blood pressure is above 80. Your personal target blood pressure may vary depending on your medical conditions, your age, and other factors. Follow these instructions at home: Eating and drinking  Eat a diet that is high in fiber and potassium, and low in sodium, added sugar, and fat. An example of this eating plan is called the DASH diet. DASH stands for Dietary Approaches to Stop Hypertension. To eat this way: Eat   plenty of fresh fruits and vegetables. Try to fill one half of your plate at each meal with fruits and vegetables. Eat whole grains, such as whole-wheat pasta, brown rice, or whole-grain bread. Fill about one  fourth of your plate with whole grains. Eat or drink low-fat dairy products, such as skim milk or low-fat yogurt. Avoid fatty cuts of meat, processed or cured meats, and poultry with skin. Fill about one fourth of your plate with lean proteins, such as fish, chicken without skin, beans, eggs, or tofu. Avoid pre-made and processed foods. These tend to be higher in sodium, added sugar, and fat. Reduce your daily sodium intake. Many people with hypertension should eat less than 1,500 mg of sodium a day. Do not drink alcohol if: Your health care provider tells you not to drink. You are pregnant, may be pregnant, or are planning to become pregnant. If you drink alcohol: Limit how much you have to: 0-1 drink a day for women. 0-2 drinks a day for men. Know how much alcohol is in your drink. In the [place] one drink equals one 12 oz bottle of beer (355 mL), one 5 oz glass of wine (148 mL), or one 1 oz glass of hard liquor (44 mL). Lifestyle  Work with your health care provider to maintain a healthy body weight or to lose weight. Ask what an ideal weight is for you. Get at least 30 minutes of exercise that causes your heart to beat faster (aerobic exercise) most days of the week. Activities may include walking, swimming, or biking. Include exercise to strengthen your muscles (resistance exercise), such as Pilates or lifting weights, as part of your weekly exercise routine. Try to do these types of exercises for 30 minutes at least 3 days a week. Do not use any products that contain nicotine or tobacco. These products include cigarettes, chewing tobacco, and vaping devices, such as e-cigarettes. If you need help quitting, ask your health care provider. Monitor your blood pressure at home as told by your health care provider. Keep all follow-up visits. This is important. Medicines Take over-the-counter and prescription medicines only as told by your health care provider. Follow directions carefully. Blood  pressure medicines must be taken as prescribed. Do not skip doses of blood pressure medicine. Doing this puts you at risk for problems and can make the medicine less effective. Ask your health care provider about side effects or reactions to medicines that you should watch for. Contact a health care provider if you: Think you are having a reaction to a medicine you are taking. Have headaches that keep coming back (recurring). Feel dizzy. Have swelling in your ankles. Have trouble with your vision. Get help right away if you: Develop a severe headache or confusion. Have unusual weakness or numbness. Feel faint. Have severe pain in your chest or abdomen. Vomit repeatedly. Have trouble breathing. These symptoms may be an emergency. Get help right away. Call 911. Do not wait to see if the symptoms will go away. Do not drive yourself to the hospital. Summary Hypertension is when the force of blood pumping through your arteries is too strong. If this condition is not controlled, it may put you at risk for serious complications. Your personal target blood pressure may vary depending on your medical conditions, your age, and other factors. For most people, a normal blood pressure is less than 120/80. Hypertension is treated with lifestyle changes, medicines, or a combination of both. Lifestyle changes include losing weight, eating a healthy,   low-sodium diet, exercising more, and limiting alcohol. This information is not intended to replace advice given to you by your health care provider. Make sure you discuss any questions you have with your health care provider. Document Revised: 03/02/2021 Document Reviewed: 03/02/2021 Elsevier Patient Education  2023 [employer]  

## 2022-03-09 NOTE — Progress Notes (Unsigned)
Subjective:  Patient ID: Christian Ward, male    DOB: 1950/06/19  Age: 71 y.o. MRN: [id number]  CC: Annual Exam, Hypertension, and Anemia   HPI Christian Ward presents for a CPX and f/up -   He walks about a mile a day and has developed dyspnea on exertion on an incline.  He denies chest pain or diaphoresis.  He has chronic constipation which is well controlled with MiraLAX.  He has a history of peptic ulcer disease and tells me he is taking both an H2 blocker and a PPI.  He has a rare burning sensation in his abdomen.  He has chronic, unchanged low back pain.   Outpatient Medications Prior to Visit  Medication Sig Dispense Refill   acetaminophen (TYLENOL) 500 MG tablet Take 500 mg by mouth every 6 (six) hours as needed for mild pain.     CELEBREX 200 MG capsule Take 200 mg by mouth 2 (two) times daily as needed.     Cholecalciferol (VITAMIN D3 PO) Take 2,000 Units by mouth daily.     clonazePAM (KLONOPIN) 0.5 MG tablet TAKE 1 TABLET (0.5 MG TOTAL) BY MOUTH 2 (TWO) TIMES DAILY AS NEEDED FOR ANXIETY. 60 tablet 1   fluticasone (FLONASE) 50 MCG/ACT nasal spray Place 1 spray into both nostrils daily as needed for allergies.      Probiotic Product (PROBIOTIC DAILY) CAPS Take 1 capsule by mouth daily.     azithromycin (ZITHROMAX) 250 MG tablet 2 tabs day 1, then one tab daily 6 tablet 0   benzonatate (TESSALON PERLES) 100 MG capsule Take 1 capsule (100 mg total) by mouth 3 (three) times daily as needed. 30 capsule 0   Dietary Management Product (VASCULERA) TABS Take 1 tablet by mouth daily. 30 tablet 11   Ferric Maltol (ACCRUFER) 30 MG CAPS Take 1 capsule by mouth in the morning and at bedtime. 180 capsule 0   irbesartan (AVAPRO) 150 MG tablet Take 1 tablet (150 mg total) by mouth daily. 90 tablet 0   omeprazole (PRILOSEC) 20 MG capsule Take 20 mg by mouth daily. Takes 1 daily and may take a second dose if needed     No facility-administered medications prior to visit.    ROS Review of  Systems  Constitutional:  Negative for chills, diaphoresis, fatigue and fever.  HENT: Negative.    Eyes: Negative.   Respiratory:  Positive for shortness of breath. Negative for cough, chest tightness and wheezing.   Cardiovascular:  Negative for chest pain, palpitations and leg swelling.  Gastrointestinal:  Positive for abdominal pain and constipation. Negative for anal bleeding, blood in stool, diarrhea, nausea, rectal pain and vomiting.  Endocrine: Negative.   Genitourinary: Negative.  Negative for difficulty urinating and hematuria.  Musculoskeletal: Negative.   Skin: Negative.   Neurological: Negative.  Negative for dizziness, weakness, light-headedness and numbness.  Hematological:  Negative for adenopathy. Does not bruise/bleed easily.  Psychiatric/Behavioral:  Negative for decreased concentration, dysphoric mood, sleep disturbance and suicidal ideas. The patient is nervous/anxious.     Objective:  BP (!) 150/86 (BP Location: Left Arm, Patient Position: Sitting, Cuff Size: Large)   Pulse 60   Temp 98.3 F (36.8 C) (Oral)   Resp 16   Ht '6\' 4"'$  (1.93 m)   Wt 217 lb (98.4 kg)   SpO2 97%   BMI 26.41 kg/m   BP Readings from Last 3 Encounters:  03/09/22 (!) 150/86  03/09/21 132/84  02/19/20 132/82    Wt Readings from  Last 3 Encounters:  03/09/22 217 lb (98.4 kg)  03/09/21 216 lb (98 kg)  02/19/20 219 lb (99.3 kg)    Physical Exam Vitals reviewed.  HENT:     Nose: Nose normal.     Mouth/Throat:     Mouth: Mucous membranes are moist.  Eyes:     General: No scleral icterus.    Conjunctiva/sclera: Conjunctivae normal.  Cardiovascular:     Rate and Rhythm: Regular rhythm. Bradycardia present.     Heart sounds: Normal heart sounds, S1 normal and S2 normal. No murmur heard.    No gallop.     Comments: EKG- SB, 55 bpm No LVH, Q waves, or ST/T wave changes Pulmonary:     Effort: Pulmonary effort is normal.     Breath sounds: No stridor. No wheezing, rhonchi or rales.   Abdominal:     General: Abdomen is flat.     Tenderness: There is no abdominal tenderness. There is no guarding or rebound.     Hernia: No hernia is present. There is no hernia in the left inguinal area or right inguinal area.  Genitourinary:    Penis: Normal and circumcised.      Testes: Normal.     Epididymis:     Right: Normal.     Left: Normal.     Prostate: Normal. Not enlarged, not tender and no nodules present.     Rectum: Normal. Guaiac result negative. No mass, tenderness, anal fissure, external hemorrhoid or internal hemorrhoid. Normal anal tone.  Musculoskeletal:     Cervical back: Neck supple.     Right lower leg: No edema.     Left lower leg: No edema.  Lymphadenopathy:     Cervical: No cervical adenopathy.     Lower Body: No right inguinal adenopathy. No left inguinal adenopathy.  Skin:    General: Skin is warm and dry.     Coloration: Skin is pale. Skin is not jaundiced.     Findings: No erythema.  Neurological:     General: No focal deficit present.     Mental Status: He is alert. Mental status is at baseline.  Psychiatric:        Mood and Affect: Mood normal.        Behavior: Behavior normal.        Thought Content: Thought content normal.        Judgment: Judgment normal.     Lab Results  Component Value Date   WBC 6.5 03/09/2022   HGB 12.2 (L) 03/09/2022   HCT 36.5 (L) 03/09/2022   PLT 225.0 03/09/2022   GLUCOSE 91 03/09/2022   CHOL 143 03/09/2022   TRIG 89.0 03/09/2022   HDL 37.40 (L) 03/09/2022   LDLCALC 88 03/09/2022   ALT 19 03/09/2022   AST 21 03/09/2022   NA 138 03/09/2022   K 4.1 03/09/2022   CL 104 03/09/2022   CREATININE 0.95 03/09/2022   BUN 18 03/09/2022   CO2 31 03/09/2022   TSH 1.32 03/09/2022   PSA 1.70 10/07/2021   INR 1.06 08/31/2016   HGBA1C 5.8 03/09/2021    MR LUMBAR SPINE WO CONTRAST  Result Date: 04/05/2021 CLINICAL DATA:  Low back pain, bilateral leg pain EXAM: MRI LUMBAR SPINE WITHOUT CONTRAST TECHNIQUE:  Multiplanar, multisequence MR imaging of the lumbar spine was performed. No intravenous contrast was administered. 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 Alignment: Mild dextrocurvature. Trace retrolisthesis L2 on L3,  which is new from the prior exam. Vertebrae: No acute fracture or suspicious osseous lesion. Again noted is fusion of the anterior aspects of the bilateral sacroiliac joints, which has progressed from the prior exam. Interval placement of right sacroiliac fusion hardware. Conus medullaris and cauda equina: Conus extends to the L2 level. Conus and cauda equina appear normal. Paraspinal and other soft tissues: Negative Disc levels: T12-L1: No significant disc bulge. No spinal canal stenosis or neural foraminal narrowing. L1-L2: Minimal disc bulge. Mild facet arthropathy. No spinal canal stenosis or neural foraminal narrowing. L2-L3: Mild disc bulge. Mild facet arthropathy. No spinal canal stenosis or neural foraminal narrowing. L3-L4: Mild disc bulge. Moderate facet arthropathy. No spinal canal stenosis or neural foraminal narrowing. L4-L5: Mild disc bulge. Mild facet arthropathy. No spinal canal stenosis or neural foraminal narrowing. L5-S1: Mild disc bulge. Mild facet arthropathy. No spinal canal stenosis or neural foraminal narrowing. IMPRESSION: 1. Progression of degenerative changes, with increased mild dextrocurvature. No spinal canal stenosis or neural foraminal narrowing. 2. 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 3. Note is made of transitional anatomy at L5-S1, with right greater than left lumbosacral assimilation joints. This can also be a cause of pain. Electronically Signed   By: Merilyn Baba M.D.   On: 04/05/2021 03:03    Assessment & Plan:   Burris was seen today for annual exam, hypertension and  anemia.  Diagnoses and all orders for this visit:  Essential hypertension- His blood pressure is not adequately well controlled.  He wants to improve his lifestyle modifications and stay on the same dose of the ARB. -     Basic metabolic panel; Future -     CBC with Differential/Platelet; Future -     Hepatic function panel; Future -     TSH; Future -     irbesartan (AVAPRO) 150 MG tablet; Take 1 tablet (150 mg total) by mouth daily. -     Urinalysis, Routine w reflex microscopic; Future -     EKG 12-Lead -     TSH -     Urinalysis, Routine w reflex microscopic -     Hepatic function panel -     CBC with Differential/Platelet -     Basic metabolic panel  Iron deficiency anemia, unspecified iron deficiency anemia type- He is anemic but his iron level is normal. -     IBC + Ferritin; Future -     CBC with Differential/Platelet; Future -     CBC with Differential/Platelet -     IBC + Ferritin  Dyslipidemia, goal LDL below 100- LDL goal achieved. Doing well on the statin  -     Lipid panel; Future -     Hepatic function panel; Future -     TSH; Future -     TSH -     Hepatic function panel -     Lipid panel -     rosuvastatin (CRESTOR) 10 MG tablet; Take 1 tablet (10 mg total) by mouth daily.  Encounter for general adult medical examination with abnormal findings-exam completed, labs reviewed, vaccines reviewed and updated, cancer screenings are up-to-date, patient education was given.  Venous stasis dermatitis of right lower extremity -     Dietary Management Product (VASCULERA) TABS; Take 1 tablet by mouth daily.  PUD (peptic ulcer disease) -     omeprazole (PRILOSEC) 20 MG capsule; Take 1 capsule (20 mg total) by mouth  2 (two) times daily before a meal. Takes 1 daily and may take a second dose if needed  ADENOCARCINOMA, PROSTATE- Recent PSA was normal. -     Cancel: PSA; Future  Constipation, unspecified constipation type- Labs are negative for secondary causes. -      Magnesium; Future -     Magnesium  Flu vaccine need -     Flu Vaccine QUAD High Dose(Fluad)  Deficiency anemia -     Vitamin B12; Future -     Vitamin B1; Future -     Zinc; Future -     Folate; Future  Gastroesophageal reflux disease without esophagitis -     omeprazole (PRILOSEC) 20 MG capsule; Take 1 capsule (20 mg total) by mouth 2 (two) times daily before a meal. Takes 1 daily and may take a second dose if needed  DOE (dyspnea on exertion)- Will evaluate for atherosclerosis. -     Cancel: CT CARDIAC SCORING (SELF PAY ONLY); Future -     CT CARDIAC SCORING ([facility] LOCATIONS ONLY); Future   I have discontinued Mahamadou A. Ahonen's ACCRUFeR, benzonatate, and azithromycin. I have also changed his omeprazole. Additionally, I am having him start on rosuvastatin. Lastly, I am having him maintain his Probiotic Daily, fluticasone, acetaminophen, clonazePAM, Cholecalciferol (VITAMIN D3 PO), CeleBREX, Vasculera, and irbesartan.  Meds ordered this encounter  Medications   Dietary Management Product (VASCULERA) TABS    Sig: Take 1 tablet by mouth daily.    Dispense:  30 tablet    Refill:  11   irbesartan (AVAPRO) 150 MG tablet    Sig: Take 1 tablet (150 mg total) by mouth daily.    Dispense:  90 tablet    Refill:  0   rosuvastatin (CRESTOR) 10 MG tablet    Sig: Take 1 tablet (10 mg total) by mouth daily.    Dispense:  90 tablet    Refill:  1   omeprazole (PRILOSEC) 20 MG capsule    Sig: Take 1 capsule (20 mg total) by mouth 2 (two) times daily before a meal. Takes 1 daily and may take a second dose if needed    Dispense:  180 capsule    Refill:  1     Follow-up: Return in about 3 months (around 06/09/2022).  Scarlette Calico, MD

## 2022-03-10 MED ORDER — SHINGRIX 50 MCG/0.5ML IM SUSR: 0.5000 mL | Freq: Once | INTRAMUSCULAR | 1 refills | Status: AC

## 2022-04-06 DIAGNOSIS — M461 Sacroiliitis, not elsewhere classified: Secondary | ICD-10-CM | POA: Diagnosis not present

## 2022-04-14 DIAGNOSIS — M47816 Spondylosis without myelopathy or radiculopathy, lumbar region: Secondary | ICD-10-CM | POA: Diagnosis not present

## 2022-04-14 DIAGNOSIS — M461 Sacroiliitis, not elsewhere classified: Secondary | ICD-10-CM | POA: Diagnosis not present

## 2022-04-15 ENCOUNTER — Ambulatory Visit: Payer: Medicare Other | Admitting: Internal Medicine

## 2022-04-15 ENCOUNTER — Encounter: Payer: Self-pay | Admitting: Internal Medicine

## 2022-04-15 VITALS — BP 110/60 | HR 72 | Ht 75.5 in | Wt 214.0 lb

## 2022-04-15 DIAGNOSIS — R110 Nausea: Secondary | ICD-10-CM | POA: Diagnosis not present

## 2022-04-15 DIAGNOSIS — Z1211 Encounter for screening for malignant neoplasm of colon: Secondary | ICD-10-CM

## 2022-04-15 DIAGNOSIS — D649 Anemia, unspecified: Secondary | ICD-10-CM | POA: Diagnosis not present

## 2022-04-15 DIAGNOSIS — K5900 Constipation, unspecified: Secondary | ICD-10-CM | POA: Diagnosis not present

## 2022-04-15 DIAGNOSIS — K219 Gastro-esophageal reflux disease without esophagitis: Secondary | ICD-10-CM

## 2022-04-15 NOTE — Progress Notes (Signed)
HISTORY OF PRESENT ILLNESS:  Christian Ward is a 71 y.o. male, former [employer] and current fundraiser for [employer], with a history of prostate cancer, fibromyalgia, irritable bowel syndrome, chronic back pain who presents today to establish GI care.  He is sent by his primary care provider [name].  Patient's previous GI care was provided by Dr. Richmond Campbell until his retirement.  Patient presents today with distal intestinal complaints and interested in performing endoscopic procedures for evaluation.  The patient underwent both colonoscopy and upper endoscopy with Dr. Richmond Campbell in September 2015.  I have reviewed those reports.  Colonoscopy was performed for routine colon cancer screening.  He was found to have internal hemorrhoids.  Examination was otherwise normal.  Follow-up in 10 years recommended.  Upper endoscopy revealed a Schatzki's ring.  Small hiatal hernia.  No active inflammation.  He was continued on omeprazole.  Patient did try to wean off PPI but was unsuccessful as this resulted in significant active GERD symptoms dysphagia.  Symptoms resolved after resumption of PPI.  Currently on 20 mg of omeprazole daily.  Patient reports problems with sensitive stomach.  He has been under a lot of stress related to his wife's long illness.  She subsequently passed.  Tells me that he has issues with nausea.  If it is problematic during the day he will take Zofran.  This helps.  If it is problematic at night he will take 12.5 mg of Phenergan.  He does have constipation.  Occasionally will take MiraLAX.  Small dosages work.  He is status post cholecystectomy and appendectomy.  He will have occasional mild dysphagia.  This concerns him.  Also, issues with constipation concerned him.  Review of blood work from March 09, 2022 shows normal comprehensive metabolic panel.  CBC normal except for mild anemia with hemoglobin 12.2.  Normal iron studies.  CT of the pelvis with contrast November  2016 reviewed.  No acute findings post-prostatectomy  REVIEW OF SYSTEMS:  All non-GI ROS negative as otherwise stated in the HPI except for sinus and allergy trouble, anxiety, arthritis, back pain, sleeping problems  Past Medical History:  Diagnosis Date   Acid reflux    Cancer ([place])    Chronic recurrent bronchiolitis    Complication of anesthesia    trouble waking up   Fibromyalgia    H/O infectious mononucleosis    History of shingles    Hypercholesterolemia    IBS (irritable bowel syndrome)    Prostate cancer ([place]) 02/27/2007   Gleason 4+3=7   S/P radiation therapy    Shoulder pain, left     Past Surgical History:  Procedure Laterality Date   APPENDECTOMY  age 76   CHOLECYSTECTOMY  s/p lap chole 4/07   Dr Dalbert Batman   COLONOSCOPY     PROSTATE BIOPSY  2008, 10/2008   PROSTATECTOMY  08/06/2009   Gleason 4+3=7   SACROILIAC JOINT FUSION Right 09/08/2016   Procedure: RIGHT SIDED SACROILIAC JOINT FUSION;  Surgeon: Phylliss Bob, MD;  Location: [facility];  Service: Orthopedics;  Laterality: Right;  RIGHT SIDED SACROILIAC JOINT FUSION   WISDOM TOOTH EXTRACTION      Social History TELESFORO Ward  reports that he quit smoking about 36 years ago. His smoking use included cigarettes. He started smoking about 41 years ago. He has a 5.00 pack-year smoking history. He has never used smokeless tobacco. He reports current alcohol use. He reports that he does not use drugs.  family history includes COPD  in his father; Cancer in his brother and father; Coronary artery disease in his father; Diabetes in his father and sister; Heart disease in his father; Hypertension in his father; Lung disease in his father and mother; Stroke in his father.  Allergies  Allergen Reactions   Tramadol Nausea And Vomiting   Oxycodone Nausea Only       PHYSICAL EXAMINATION: Vital signs: BP 110/60   Pulse 72   Ht 6' 3.5" (1.918 m)   Wt 214 lb (97.1 kg)   BMI 26.40 kg/m   Constitutional: generally  well-appearing, no acute distress Psychiatric: alert and oriented x3, cooperative Eyes: extraocular movements intact, anicteric, conjunctiva pink Mouth: oral pharynx moist, no lesions Neck: supple no lymphadenopathy Cardiovascular: heart regular rate and rhythm, no murmur Lungs: clear to auscultation bilaterally Abdomen: soft, nontender, nondistended, no obvious ascites, no peritoneal signs, normal bowel sounds, no organomegaly Rectal: Deferred to colonoscopy Extremities: no clubbing, cyanosis, or lower extremity edema bilaterally Skin: no lesions on visible extremities Neuro: No focal deficits.  Cranial nerves intact  ASSESSMENT:  1.  Chronic GERD.  Requires PPI to control symptoms. 2.  Mild dysphagia.  Known history of Schatzki's ring. 3.  Chronic intermittent nausea.  Sensitive stomach. 4.  Constipation.  Bowel habit change. 5.  Colon cancer screening.  Last examination a little over 8 years ago 6.  Mild anemia.  Normal iron studies   PLAN:  1.  Reflux precautions 2.  Continue omeprazole.  Medication risk reviewed. 3.  Upper endoscopy to evaluate chronic GERD mild dysphagia.The nature of the procedure, as well as the risks, benefits, and alternatives were carefully and thoroughly reviewed with the patient. Ample time for discussion and questions allowed. The patient understood, was satisfied, and agreed to proceed. 4.  Colonoscopy to evaluate change in bowel habits with constipation and provide follow-up screening.The nature of the procedure, as well as the risks, benefits, and alternatives were carefully and thoroughly reviewed with the patient. Ample time for discussion and questions allowed. The patient understood, was satisfied, and agreed to proceed. 5.  Refill Zofran as needed 6.  Refill Phenergan as needed 7.  Patient is going to wait till sometime after the new year to contact the office.  At that time he would like to schedule his endoscopic procedures.  We will arrange for  him to see previous nurse at that time.  He does contact the office in the interim for any questions or problems A total time of 60 minutes was spent preparing to see the patient, reviewing myriad of outside records including procedure reports, laboratories, and x-rays.  Obtaining comprehensive history, performing medically appropriate physical examination, counseling and educating the patient regarding above listed issues, ordering medications, ordering endoscopic procedures, and documenting clinical information in the health record.

## 2022-04-15 NOTE — Patient Instructions (Signed)
_______________________________________________________  If you are age 70 or older, your body mass index should be between 23-30. Your Body mass index is 26.4 kg/m. If this is out of the aforementioned range listed, please consider follow up with your Primary Care Provider.  If you are age 84 or younger, your body mass index should be between 19-25. Your Body mass index is 26.4 kg/m. If this is out of the aformentioned range listed, please consider follow up with your Primary Care Provider.   ________________________________________________________  The [facility] providers would like to encourage you to use [facility] to communicate with providers for non-urgent requests or questions.  Due to long hold times on the telephone, sending your provider a message by [facility] may be a faster and more efficient way to get a response.  Please allow 48 business hours for a response.  Please remember that this is for non-urgent requests.  _______________________________________________________  Please call back when you are ready to schedule any procedures.

## 2022-04-19 DIAGNOSIS — H26492 Other secondary cataract, left eye: Secondary | ICD-10-CM | POA: Diagnosis not present

## 2022-04-19 DIAGNOSIS — H0014 Chalazion left upper eyelid: Secondary | ICD-10-CM | POA: Diagnosis not present

## 2022-04-26 ENCOUNTER — Inpatient Hospital Stay: Admission: RE | Admit: 2022-04-26 | Payer: Medicare Other | Source: Ambulatory Visit

## 2022-04-27 DIAGNOSIS — M47816 Spondylosis without myelopathy or radiculopathy, lumbar region: Secondary | ICD-10-CM | POA: Diagnosis not present

## 2022-04-27 DIAGNOSIS — M461 Sacroiliitis, not elsewhere classified: Secondary | ICD-10-CM | POA: Diagnosis not present

## 2022-04-27 DIAGNOSIS — M7918 Myalgia, other site: Secondary | ICD-10-CM | POA: Diagnosis not present

## 2022-05-04 ENCOUNTER — Other Ambulatory Visit: Payer: Self-pay

## 2022-05-04 MED ORDER — COMIRNATY 30 MCG/0.3ML IM SUSY
0.3000 mL | PREFILLED_SYRINGE | Freq: Once | INTRAMUSCULAR | 0 refills | Status: AC
  Filled 2022-05-04: qty 0.3, 1d supply, fill #0

## 2022-05-12 LAB — PSA: PSA: 1.75

## 2022-05-16 DIAGNOSIS — M4696 Unspecified inflammatory spondylopathy, lumbar region: Secondary | ICD-10-CM | POA: Diagnosis not present

## 2022-05-27 ENCOUNTER — Ambulatory Visit
Admission: RE | Admit: 2022-05-27 | Discharge: 2022-05-27 | Disposition: A | Discharge status: Home / Self Care | Payer: Medicare Other | Source: Ambulatory Visit | Attending: Internal Medicine | Admitting: Internal Medicine

## 2022-05-27 DIAGNOSIS — R0609 Other forms of dyspnea: Secondary | ICD-10-CM

## 2022-05-27 DIAGNOSIS — I700 Atherosclerosis of aorta: Secondary | ICD-10-CM | POA: Diagnosis not present

## 2022-05-28 ENCOUNTER — Encounter: Payer: Self-pay | Admitting: Internal Medicine

## 2022-06-07 DIAGNOSIS — M47816 Spondylosis without myelopathy or radiculopathy, lumbar region: Secondary | ICD-10-CM | POA: Diagnosis not present

## 2022-06-09 DIAGNOSIS — L570 Actinic keratosis: Secondary | ICD-10-CM | POA: Diagnosis not present

## 2022-06-09 DIAGNOSIS — L814 Other melanin hyperpigmentation: Secondary | ICD-10-CM | POA: Diagnosis not present

## 2022-06-09 DIAGNOSIS — L820 Inflamed seborrheic keratosis: Secondary | ICD-10-CM | POA: Diagnosis not present

## 2022-06-09 DIAGNOSIS — C44519 Basal cell carcinoma of skin of other part of trunk: Secondary | ICD-10-CM | POA: Diagnosis not present

## 2022-06-09 DIAGNOSIS — Z85828 Personal history of other malignant neoplasm of skin: Secondary | ICD-10-CM | POA: Diagnosis not present

## 2022-06-16 DIAGNOSIS — M47816 Spondylosis without myelopathy or radiculopathy, lumbar region: Secondary | ICD-10-CM | POA: Diagnosis not present

## 2022-06-20 DIAGNOSIS — M47816 Spondylosis without myelopathy or radiculopathy, lumbar region: Secondary | ICD-10-CM | POA: Diagnosis not present

## 2022-06-23 DIAGNOSIS — M47816 Spondylosis without myelopathy or radiculopathy, lumbar region: Secondary | ICD-10-CM | POA: Diagnosis not present

## 2022-06-27 DIAGNOSIS — M47816 Spondylosis without myelopathy or radiculopathy, lumbar region: Secondary | ICD-10-CM | POA: Diagnosis not present

## 2022-06-30 DIAGNOSIS — M47816 Spondylosis without myelopathy or radiculopathy, lumbar region: Secondary | ICD-10-CM | POA: Diagnosis not present

## 2022-07-08 DIAGNOSIS — M461 Sacroiliitis, not elsewhere classified: Secondary | ICD-10-CM | POA: Diagnosis not present

## 2022-07-25 DIAGNOSIS — M47816 Spondylosis without myelopathy or radiculopathy, lumbar region: Secondary | ICD-10-CM | POA: Diagnosis not present

## 2022-07-27 ENCOUNTER — Other Ambulatory Visit: Payer: Self-pay | Admitting: Internal Medicine

## 2022-07-27 DIAGNOSIS — I10 Essential (primary) hypertension: Secondary | ICD-10-CM

## 2022-07-28 ENCOUNTER — Telehealth: Payer: Self-pay

## 2022-07-28 NOTE — Telephone Encounter (Signed)
Called patient to schedule Medicare Annual Wellness Visit (AWV). Left message for patient to call back and schedule Medicare Annual Wellness Visit (AWV).  Last date of AWV: eligible for Awv-I as of 07/07/17  Please schedule an appointment at any time with [facility].    Norton Blizzard, Alianza ([facility])  [facility] [phone]

## 2022-08-02 DIAGNOSIS — M47816 Spondylosis without myelopathy or radiculopathy, lumbar region: Secondary | ICD-10-CM | POA: Diagnosis not present

## 2022-08-04 DIAGNOSIS — M47816 Spondylosis without myelopathy or radiculopathy, lumbar region: Secondary | ICD-10-CM | POA: Diagnosis not present

## 2022-08-08 DIAGNOSIS — M47816 Spondylosis without myelopathy or radiculopathy, lumbar region: Secondary | ICD-10-CM | POA: Diagnosis not present

## 2022-08-10 DIAGNOSIS — M461 Sacroiliitis, not elsewhere classified: Secondary | ICD-10-CM | POA: Diagnosis not present

## 2022-08-10 DIAGNOSIS — M47816 Spondylosis without myelopathy or radiculopathy, lumbar region: Secondary | ICD-10-CM | POA: Diagnosis not present

## 2022-08-10 DIAGNOSIS — G8929 Other chronic pain: Secondary | ICD-10-CM | POA: Diagnosis not present

## 2022-08-24 DIAGNOSIS — M47816 Spondylosis without myelopathy or radiculopathy, lumbar region: Secondary | ICD-10-CM | POA: Diagnosis not present

## 2022-08-31 ENCOUNTER — Ambulatory Visit: Payer: Medicare Other | Admitting: Podiatry

## 2022-09-09 DIAGNOSIS — H26492 Other secondary cataract, left eye: Secondary | ICD-10-CM | POA: Diagnosis not present

## 2022-09-13 DIAGNOSIS — Z85828 Personal history of other malignant neoplasm of skin: Secondary | ICD-10-CM | POA: Diagnosis not present

## 2022-09-13 DIAGNOSIS — L565 Disseminated superficial actinic porokeratosis (DSAP): Secondary | ICD-10-CM | POA: Diagnosis not present

## 2022-09-13 DIAGNOSIS — L720 Epidermal cyst: Secondary | ICD-10-CM | POA: Diagnosis not present

## 2022-09-13 DIAGNOSIS — D1801 Hemangioma of skin and subcutaneous tissue: Secondary | ICD-10-CM | POA: Diagnosis not present

## 2022-09-13 DIAGNOSIS — L853 Xerosis cutis: Secondary | ICD-10-CM | POA: Diagnosis not present

## 2022-09-14 ENCOUNTER — Encounter: Payer: Self-pay | Admitting: Podiatry

## 2022-09-14 ENCOUNTER — Ambulatory Visit: Payer: Medicare Other | Admitting: Podiatry

## 2022-09-14 DIAGNOSIS — M722 Plantar fascial fibromatosis: Secondary | ICD-10-CM | POA: Diagnosis not present

## 2022-09-14 DIAGNOSIS — L84 Corns and callosities: Secondary | ICD-10-CM

## 2022-09-14 DIAGNOSIS — B351 Tinea unguium: Secondary | ICD-10-CM

## 2022-09-14 NOTE — Progress Notes (Signed)
Subjective:   Patient ID: Christian Ward, male   DOB: 72 y.o.   MRN: [id number]   HPI Patient presents concerned about discoloration of big toe second toe right and that there is a callus also on the right second toe and he is concerned about discoloration around his ankles   ROS      Objective:  Physical Exam  Neurovascular status was found to be intact muscle strength is adequate patient is got significant thick yellow brittle nailbeds hallux and second toes right digital deformity bilateral with keratotic lesion second right mildly tender     Assessment:  Several problems with damaged hallux nail second nail right with mycotic component also noted along with lesion digital deformity second right     Plan:  H&P reviewed and at this point I have recommended trimming which was done today courtesy and no removal of nails as he is going out of town to [place] in 2 weeks.  Trim the lesion courtesy and discussed digital deformity and patient will be seen back as needed may ultimately require permanent nail removal but I made him aware of today

## 2022-09-15 DIAGNOSIS — H26492 Other secondary cataract, left eye: Secondary | ICD-10-CM | POA: Diagnosis not present

## 2022-10-04 DIAGNOSIS — M461 Sacroiliitis, not elsewhere classified: Secondary | ICD-10-CM | POA: Diagnosis not present

## 2022-10-20 ENCOUNTER — Ambulatory Visit (INDEPENDENT_AMBULATORY_CARE_PROVIDER_SITE_OTHER): Payer: Medicare Other | Admitting: Internal Medicine

## 2022-10-20 ENCOUNTER — Encounter: Payer: Self-pay | Admitting: Internal Medicine

## 2022-10-20 VITALS — BP 118/68 | HR 79 | Temp 97.9°F | Ht 75.5 in | Wt 208.0 lb

## 2022-10-20 DIAGNOSIS — C61 Malignant neoplasm of prostate: Secondary | ICD-10-CM | POA: Diagnosis not present

## 2022-10-20 DIAGNOSIS — I10 Essential (primary) hypertension: Secondary | ICD-10-CM

## 2022-10-20 DIAGNOSIS — D649 Anemia, unspecified: Secondary | ICD-10-CM | POA: Diagnosis not present

## 2022-10-20 DIAGNOSIS — K219 Gastro-esophageal reflux disease without esophagitis: Secondary | ICD-10-CM

## 2022-10-20 DIAGNOSIS — D539 Nutritional anemia, unspecified: Secondary | ICD-10-CM

## 2022-10-20 DIAGNOSIS — K279 Peptic ulcer, site unspecified, unspecified as acute or chronic, without hemorrhage or perforation: Secondary | ICD-10-CM | POA: Diagnosis not present

## 2022-10-20 DIAGNOSIS — R718 Other abnormality of red blood cells: Secondary | ICD-10-CM

## 2022-10-20 LAB — CBC WITH DIFFERENTIAL/PLATELET
Basophils Absolute: 0 10*3/uL (ref 0.0–0.1)
Basophils Relative: 0.4 % (ref 0.0–3.0)
Eosinophils Absolute: 0.1 10*3/uL (ref 0.0–0.7)
Eosinophils Relative: 1 % (ref 0.0–5.0)
HCT: 38.8 % — ABNORMAL LOW (ref 39.0–52.0)
Hemoglobin: 12.9 g/dL — ABNORMAL LOW (ref 13.0–17.0)
Lymphocytes Relative: 19.1 % (ref 12.0–46.0)
Lymphs Abs: 1.3 10*3/uL (ref 0.7–4.0)
MCHC: 33.2 g/dL (ref 30.0–36.0)
MCV: 92.7 fl (ref 78.0–100.0)
Monocytes Absolute: 0.6 10*3/uL (ref 0.1–1.0)
Monocytes Relative: 9.1 % (ref 3.0–12.0)
Neutro Abs: 4.6 10*3/uL (ref 1.4–7.7)
Neutrophils Relative %: 70.4 % (ref 43.0–77.0)
Platelets: 218 10*3/uL (ref 150.0–400.0)
RBC: 4.19 Mil/uL — ABNORMAL LOW (ref 4.22–5.81)
RDW: 12.6 % (ref 11.5–15.5)
WBC: 6.6 10*3/uL (ref 4.0–10.5)

## 2022-10-20 LAB — IBC + FERRITIN
Ferritin: 123.6 ng/mL (ref 22.0–322.0)
Iron: 104 ug/dL (ref 42–165)
Saturation Ratios: 32.6 % (ref 20.0–50.0)
TIBC: 319.2 ug/dL (ref 250.0–450.0)
Transferrin: 228 mg/dL (ref 212.0–360.0)

## 2022-10-20 LAB — TSH: TSH: 0.93 u[IU]/mL (ref 0.35–5.50)

## 2022-10-20 LAB — BASIC METABOLIC PANEL
BUN: 19 mg/dL (ref 6–23)
CO2: 30 mEq/L (ref 19–32)
Calcium: 9.3 mg/dL (ref 8.4–10.5)
Chloride: 101 mEq/L (ref 96–112)
Creatinine, Ser: 1.06 mg/dL (ref 0.40–1.50)
GFR: 70.72 mL/min (ref >60.00)
Glucose, Bld: 109 mg/dL — ABNORMAL HIGH (ref 70–99)
Potassium: 4.5 mEq/L (ref 3.5–5.1)
Sodium: 136 mEq/L (ref 135–145)

## 2022-10-20 LAB — FOLATE: Folate: 19.2 ng/mL (ref >5.9)

## 2022-10-20 LAB — VITAMIN B12: Vitamin B-12: 429 pg/mL (ref 211–911)

## 2022-10-20 MED ORDER — OMEPRAZOLE 20 MG PO CPDR: 20.0000 mg | DELAYED_RELEASE_CAPSULE | Freq: Two times a day (BID) | ORAL | 1 refills | Status: DC

## 2022-10-20 NOTE — Progress Notes (Signed)
Subjective:  Patient ID: Christian Ward, male    DOB: Oct 09, 1950  Age: 72 y.o. MRN: [id number]  CC: Hypertension and Anemia   HPI RORRY TAWES presents for f/up ---  He is active and denies DOE, CP, SOB, edema.  Outpatient Medications Prior to Visit  Medication Sig Dispense Refill   acetaminophen (TYLENOL) 500 MG tablet Take 500 mg by mouth every 6 (six) hours as needed for mild pain.     CELEBREX 200 MG capsule Take 200 mg by mouth 2 (two) times daily as needed.     Cholecalciferol (VITAMIN D3 PO) Take 2,000 Units by mouth daily.     clonazePAM (KLONOPIN) 0.5 MG tablet TAKE 1 TABLET (0.5 MG TOTAL) BY MOUTH 2 (TWO) TIMES DAILY AS NEEDED FOR ANXIETY. 60 tablet 1   Dietary Management Product (VASCULERA) TABS Take 1 tablet by mouth daily. 30 tablet 11   Ferrous Sulfate (IRON SUPPLEMENT PO) Take by mouth.     fluticasone (FLONASE) 50 MCG/ACT nasal spray Place 1 spray into both nostrils daily as needed for allergies.      Multiple Vitamin (MULTIVITAMIN) tablet Take 1 tablet by mouth daily.     ondansetron (ZOFRAN) 4 MG tablet Take 4 mg by mouth every 8 (eight) hours as needed for nausea or vomiting.     Probiotic Product (PROBIOTIC DAILY) CAPS Take 1 capsule by mouth daily.     promethazine (PHENERGAN) 25 MG tablet Take 25 mg by mouth every 6 (six) hours as needed for nausea or vomiting.     rosuvastatin (CRESTOR) 10 MG tablet Take 1 tablet (10 mg total) by mouth daily. 90 tablet 1   irbesartan (AVAPRO) 150 MG tablet TAKE 1 TABLET BY MOUTH EVERY DAY 90 tablet 0   omeprazole (PRILOSEC) 20 MG capsule Take 1 capsule (20 mg total) by mouth 2 (two) times daily before a meal. Takes 1 daily and may take a second dose if needed 180 capsule 1   No facility-administered medications prior to visit.    ROS Review of Systems  Constitutional: Negative.  Negative for diaphoresis and fatigue.  HENT: Negative.    Eyes: Negative.   Respiratory:  Negative for cough, chest tightness, shortness of  breath and wheezing.   Cardiovascular:  Negative for chest pain, palpitations and leg swelling.  Gastrointestinal:  Negative for abdominal pain, constipation, diarrhea, nausea and vomiting.  Endocrine: Negative.   Genitourinary: Negative.  Negative for difficulty urinating.  Musculoskeletal:  Negative for arthralgias, back pain, joint swelling and myalgias.  Skin: Negative.  Negative for color change and pallor.  Neurological:  Negative for dizziness, weakness and light-headedness.  Hematological:  Negative for adenopathy. Does not bruise/bleed easily.  Psychiatric/Behavioral: Negative.      Objective:  BP 118/68 (BP Location: Right Arm, Patient Position: Sitting, Cuff Size: Large)   Pulse 79   Temp 97.9 F (36.6 C) (Oral)   Ht 6' 3.5" (1.918 m)   Wt 208 lb (94.3 kg)   SpO2 98%   BMI 25.66 kg/m   BP Readings from Last 3 Encounters:  10/20/22 118/68  04/15/22 110/60  03/09/22 (!) 150/86    Wt Readings from Last 3 Encounters:  10/20/22 208 lb (94.3 kg)  04/15/22 214 lb (97.1 kg)  03/09/22 217 lb (98.4 kg)    Physical Exam Vitals reviewed.  Constitutional:      Appearance: Normal appearance.  HENT:     Nose: Nose normal.     Mouth/Throat:     Mouth: Mucous  membranes are moist.  Eyes:     General: No scleral icterus.    Conjunctiva/sclera: Conjunctivae normal.  Cardiovascular:     Rate and Rhythm: Normal rate and regular rhythm.     Heart sounds: No murmur heard.    No friction rub. No gallop.  Pulmonary:     Breath sounds: No stridor. No wheezing, rhonchi or rales.  Abdominal:     General: Abdomen is flat.     Palpations: There is no mass.     Tenderness: There is no abdominal tenderness. There is no guarding.     Hernia: No hernia is present.  Musculoskeletal:        General: No swelling. Normal range of motion.     Cervical back: Neck supple.     Right lower leg: No edema.     Left lower leg: No edema.  Lymphadenopathy:     Cervical: No cervical  adenopathy.  Skin:    General: Skin is warm.     Coloration: Skin is pale.     Findings: No rash.  Neurological:     General: No focal deficit present.     Mental Status: He is alert. Mental status is at baseline.  Psychiatric:        Mood and Affect: Mood normal.        Behavior: Behavior normal.     Lab Results  Component Value Date   WBC 6.6 10/20/2022   HGB 12.9 (L) 10/20/2022   HCT 38.8 (L) 10/20/2022   PLT 218.0 10/20/2022   GLUCOSE 109 (H) 10/20/2022   CHOL 143 03/09/2022   TRIG 89.0 03/09/2022   HDL 37.40 (L) 03/09/2022   LDLCALC 88 03/09/2022   ALT 19 03/09/2022   AST 21 03/09/2022   NA 136 10/20/2022   K 4.5 10/20/2022   CL 101 10/20/2022   CREATININE 1.06 10/20/2022   BUN 19 10/20/2022   CO2 30 10/20/2022   TSH 0.93 10/20/2022   PSA 1.75 05/12/2022   INR 1.06 08/31/2016   HGBA1C 5.8 03/09/2021    CT CARDIAC SCORING ([facility] LOCATIONS ONLY)  Result Date: 05/28/2022 CLINICAL DATA:  Family history. * Tracking Code: FCC * EXAM: CT CARDIAC CORONARY ARTERY CALCIUM SCORE TECHNIQUE: Non-contrast imaging through the heart was performed using prospective ECG gating. Image post processing was performed on an independent workstation, allowing for quantitative analysis of the heart and coronary arteries. Note that this exam targets the heart and the chest was not imaged in its entirety. COMPARISON:  None available. FINDINGS: CORONARY CALCIUM SCORES: Left Main: 0 LAD: 0 LCx: 2 RCA: 0 Total Agatston Score: 2 MESA database percentile: 16 AORTA MEASUREMENTS: Ascending Aorta: 32 cm Descending Aorta:28 cm OTHER FINDINGS: Heart is normal size. Aorta normal caliber. Punctate calcification in the aortic root. No adenopathy. No confluent opacities or effusions. No acute findings in the upper abdomen. Chest wall soft tissues are unremarkable. No acute bony abnormality. IMPRESSION: Total Agatston score: 2 [place] database percentile: 16 Punctate atherosclerotic calcification in the aortic root.  No acute extra cardiac abnormality. Electronically Signed   By: Charlett Nose M.D.   On: 05/28/2022 09:48    Assessment & Plan:   Deficiency anemia- His anemia has improved but he is still anemic.  His vitamin levels are normal.  His absolute reticulocyte count is low so I have asked him to see hematology to see if there is a bone marrow disorder. -     IBC + Ferritin; Future -  Reticulocytes; Future -     CBC with Differential/Platelet; Future -     Vitamin B12; Future -     Folate; Future -     Vitamin B1; Future -     Zinc; Future  ADENOCARCINOMA, PROSTATE  Essential hypertension- His blood pressure is overcontrolled.  Will discontinue the ARB. -     Basic metabolic panel; Future -     CBC with Differential/Platelet; Future -     TSH; Future  PUD (peptic ulcer disease) -     Omeprazole; Take 1 capsule (20 mg total) by mouth 2 (two) times daily before a meal. Takes 1 daily and may take a second dose if needed  Dispense: 180 capsule; Refill: 1  Gastroesophageal reflux disease without esophagitis -     Omeprazole; Take 1 capsule (20 mg total) by mouth 2 (two) times daily before a meal. Takes 1 daily and may take a second dose if needed  Dispense: 180 capsule; Refill: 1  Reticulocytopenia -     Ambulatory referral to Hematology / Oncology  Anemia, unspecified type -     Ambulatory referral to Hematology / Oncology     Follow-up: Return in about 4 months (around 02/19/2023).  Sanda Linger, MD

## 2022-10-20 NOTE — Patient Instructions (Signed)

## 2022-10-25 DIAGNOSIS — R718 Other abnormality of red blood cells: Secondary | ICD-10-CM | POA: Insufficient documentation

## 2022-10-25 LAB — RETICULOCYTES
ABS Retic: 24540 cells/uL — ABNORMAL LOW (ref 25000–90000)
Retic Ct Pct: 0.6 %

## 2022-10-25 LAB — VITAMIN B1: Vitamin B1 (Thiamine): 37 nmol/L — ABNORMAL HIGH (ref 8–30)

## 2022-10-25 LAB — ZINC: Zinc: 61 ug/dL (ref 60–130)

## 2022-10-28 ENCOUNTER — Telehealth: Payer: Self-pay | Admitting: Internal Medicine

## 2022-10-28 NOTE — Telephone Encounter (Signed)
Patient called and was extremely upset that he got a call from the cancer center to set up an appointment.  No one had called and gone over his blood work with him.  He feels that he should have received a call from Dr. Yetta Barre or his nurse at the very least.  I apologized to patient and told him that I would say something to my manager.  He thanked me for my concern and consideration.

## 2022-11-09 NOTE — Telephone Encounter (Signed)
Late documentation: Patient walked into office on 7/26 wanting to have copies of his labs and better understanding why no one ever called him.  Apologized to patient for not being in touch sooner. Explained that his results were detailed out and sent to him via MyChart, which he had not activated. We talked about setting up his MyChart which he is considering so his sister and son have access but not ready to commit at this time.  Provided hard copies of his labs from last visit and information to setup him MyChart account. No additional questions at this time.

## 2022-11-18 ENCOUNTER — Other Ambulatory Visit: Payer: Self-pay

## 2022-11-18 ENCOUNTER — Encounter: Payer: Self-pay | Admitting: Hematology and Oncology

## 2022-11-18 ENCOUNTER — Other Ambulatory Visit: Payer: Medicare Other

## 2022-11-18 ENCOUNTER — Inpatient Hospital Stay: Payer: Medicare Other | Attending: Hematology and Oncology | Admitting: Hematology and Oncology

## 2022-11-18 VITALS — BP 127/61 | HR 67 | Temp 98.8°F | Resp 18 | Ht 75.5 in | Wt 213.2 lb

## 2022-11-18 DIAGNOSIS — Z87891 Personal history of nicotine dependence: Secondary | ICD-10-CM | POA: Diagnosis not present

## 2022-11-18 DIAGNOSIS — D638 Anemia in other chronic diseases classified elsewhere: Secondary | ICD-10-CM | POA: Insufficient documentation

## 2022-11-18 DIAGNOSIS — E291 Testicular hypofunction: Secondary | ICD-10-CM | POA: Insufficient documentation

## 2022-11-18 DIAGNOSIS — Z8042 Family history of malignant neoplasm of prostate: Secondary | ICD-10-CM

## 2022-11-18 DIAGNOSIS — Z8546 Personal history of malignant neoplasm of prostate: Secondary | ICD-10-CM | POA: Insufficient documentation

## 2022-11-18 DIAGNOSIS — Z9079 Acquired absence of other genital organ(s): Secondary | ICD-10-CM | POA: Diagnosis not present

## 2022-11-18 NOTE — Progress Notes (Signed)
[facility] CONSULT NOTE  Patient Care Team: Etta Grandchild, MD as PCP - General (Internal Medicine) Erlene Quan, Vinnie Level, [facility] (Inactive) as Pharmacist (Pharmacist)  ASSESSMENT & PLAN:  Anemia, chronic disease The most likely cause of his anemia is anemia chronic disease, likely secondary to low testosterone level with his prior history of prostate cancer surgery and radiation He is not symptomatic He does not need further workup or treatment  History of prostate cancer He is undergoing close surveillance and follow-up with urologist He has no signs or symptoms to suggest cancer recurrence No orders of the defined types were placed in this encounter.   All questions were answered. The patient knows to call the clinic with any problems, questions or concerns.  The total time spent in the appointment was 55 minutes encounter with patients including review of chart and various tests results, discussions about plan of care and coordination of care plan  Artis Delay, MD 7/12/20243:44 PM   CHIEF COMPLAINTS/PURPOSE OF CONSULTATION:  Anemia  HISTORY OF PRESENTING ILLNESS:  Christian Ward 71 y.o. male is here because of anemia  He was found to have abnormal CBC from recent blood work I have the opportunity to review his blood count dated back several years He has normal blood count in 2020 but since then, his hemoglobin typically range between 12-12.9 He had prior history of iron deficiency but despite adequate replacement, his recent blood work from June showed persistent anemia He denies recent chest pain on exertion, shortness of breath on minimal exertion, pre-syncopal episodes, or palpitations. He had not noticed any recent bleeding such as epistaxis, hematuria or hematochezia The patient denies over the counter NSAID ingestion. He is not on antiplatelets agents. He takes Celebrex intermittently for back pain His last colonoscopy was in 2015 which was normal He had  prior history of prostate cancer status post surgery and radiation treatment.  His age appropriate screening programs are up-to-date. He denies any pica and eats a variety of diet. 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   S/P radiation therapy    Shoulder pain, left     SURGICAL HISTORY: Past Surgical History:  Procedure Laterality Date   APPENDECTOMY  age 37   CHOLECYSTECTOMY  s/p lap chole 4/07   Dr Derrell Lolling   COLONOSCOPY     PROSTATE BIOPSY  2008, 10/2008   PROSTATECTOMY  08/06/2009   Gleason 4+3=7   SACROILIAC JOINT FUSION Right 09/08/2016   Procedure: RIGHT SIDED SACROILIAC JOINT FUSION;  Surgeon: Estill Bamberg, MD;  Location: [facility] OR;  Service: Orthopedics;  Laterality: Right;  RIGHT SIDED SACROILIAC JOINT FUSION   WISDOM TOOTH EXTRACTION      SOCIAL HISTORY: Social History   Socioeconomic History   Marital status: Married    Spouse name: Not on file   Number of children: Not on file   Years of education: Not on file   Highest education level: Not on file  Occupational History   Not on file  Tobacco Use   Smoking status: Former    Current packs/day: 0.00    Average packs/day: 0.5 packs/day for 10.0 years (5.0 ttl pk-yrs)    Types: Cigarettes    Start date: 05/09/1980    Quit date: 03/16/1986    Years since quitting: 36.7   Smokeless tobacco: Never  Vaping Use   Vaping status: Never Used  Substance and Sexual Activity   Alcohol use: Yes    Alcohol/week: 0.0 standard drinks of alcohol    Comment: weekend   Drug use: No   Sexual  activity: Not Currently  Other Topics Concern   Not on file  Social History Narrative   Not on file   Social Determinants of Health   Financial Resource Strain: Not on file  Food Insecurity: Not on file  Transportation Needs: Not on file  Physical Activity: Not on file  Stress: Not on file  Social Connections: Not on file  Intimate Partner Violence: Not on file    FAMILY HISTORY: Family History  Problem Relation Age of Onset   Hypertension Father    Cancer Father        prostate   Stroke Father    COPD Father    Coronary artery disease Father    Lung disease Father    Heart disease Father    Diabetes Father    Lung disease Mother    Diabetes Sister    Cancer Brother        surgery    ALLERGIES:  is allergic to tramadol and oxycodone.  MEDICATIONS:  Current Outpatient Medications  Medication Sig Dispense Refill   polyethylene glycol (MIRALAX / GLYCOLAX) 17 g packet Take 17 g by mouth daily as needed for mild constipation.     pregabalin (LYRICA) 50 MG capsule Take 50 mg by mouth daily.     acetaminophen (TYLENOL) 500 MG tablet Take 500 mg by mouth every 6 (six) hours as needed for mild pain.     CELEBREX 200 MG capsule Take 200 mg by mouth 2 (two) times daily as needed.     Cholecalciferol (VITAMIN D3 PO) Take 2,000 Units by mouth daily.     clonazePAM (KLONOPIN) 0.5 MG tablet TAKE 1 TABLET (0.5 MG TOTAL) BY MOUTH 2 (TWO) TIMES DAILY AS NEEDED FOR ANXIETY. 60 tablet 1   Dietary Management Product (VASCULERA) TABS Take 1 tablet by mouth daily. 30 tablet 11   fluticasone (FLONASE) 50 MCG/ACT nasal spray Place 1 spray into both nostrils daily as needed for allergies.      Multiple Vitamin (MULTIVITAMIN) tablet Take 1 tablet by mouth daily.     omeprazole (PRILOSEC) 20 MG capsule Take 1 capsule (20 mg total) by mouth 2 (two) times daily before a meal. Takes 1 daily and may take a second dose if needed 180 capsule 1   ondansetron (ZOFRAN) 4 MG tablet Take 4 mg by mouth  every 8 (eight) hours as needed for nausea or vomiting.     Probiotic Product (PROBIOTIC DAILY) CAPS Take 1 capsule by mouth daily.     promethazine (PHENERGAN) 25 MG tablet Take 25 mg by mouth every 6 (six) hours as needed for nausea or vomiting.     No current facility-administered medications for this visit.    REVIEW OF SYSTEMS:   Constitutional: Denies fevers, chills or abnormal night sweats Eyes: Denies blurriness of vision, double vision or watery eyes Ears, nose, mouth, throat, and face: Denies mucositis or sore throat Respiratory: Denies cough, dyspnea or  wheezes Cardiovascular: Denies palpitation, chest discomfort or lower extremity swelling Gastrointestinal:  Denies nausea, heartburn or change in bowel habits Skin: Denies abnormal skin rashes Lymphatics: Denies new lymphadenopathy or easy bruising Neurological:Denies numbness, tingling or new weaknesses Behavioral/Psych: Mood is stable, no new changes  All other systems were reviewed with the patient and are negative.  PHYSICAL EXAMINATION: ECOG PERFORMANCE STATUS: 0 - Asymptomatic  Vitals:   11/18/22 1359  BP: 127/61  Pulse: 67  Resp: 18  Temp: 98.8 F (37.1 C)  SpO2: 98%   Filed Weights   11/18/22 1359  Weight: 213 lb 3.2 oz (96.7 kg)    GENERAL:alert, no distress and comfortable NEURO: no focal motor/sensory deficits

## 2022-11-18 NOTE — Assessment & Plan Note (Signed)
The most likely cause of his anemia is anemia chronic disease, likely secondary to low testosterone level with his prior history of prostate cancer surgery and radiation He is not symptomatic He does not need further workup or treatment

## 2022-11-18 NOTE — Assessment & Plan Note (Signed)
He is undergoing close surveillance and follow-up with urologist He has no signs or symptoms to suggest cancer recurrence

## 2022-12-28 DIAGNOSIS — M461 Sacroiliitis, not elsewhere classified: Secondary | ICD-10-CM | POA: Diagnosis not present

## 2023-01-05 DIAGNOSIS — R0781 Pleurodynia: Secondary | ICD-10-CM | POA: Diagnosis not present

## 2023-01-11 DIAGNOSIS — H26491 Other secondary cataract, right eye: Secondary | ICD-10-CM | POA: Diagnosis not present

## 2023-02-14 ENCOUNTER — Encounter (INDEPENDENT_AMBULATORY_CARE_PROVIDER_SITE_OTHER): Payer: Self-pay | Admitting: Otolaryngology

## 2023-02-14 ENCOUNTER — Ambulatory Visit (INDEPENDENT_AMBULATORY_CARE_PROVIDER_SITE_OTHER): Payer: Medicare Other | Admitting: Otolaryngology

## 2023-02-14 VITALS — Ht 76.0 in | Wt 215.0 lb

## 2023-02-14 DIAGNOSIS — R0982 Postnasal drip: Secondary | ICD-10-CM

## 2023-02-14 DIAGNOSIS — J310 Chronic rhinitis: Secondary | ICD-10-CM | POA: Diagnosis not present

## 2023-02-14 DIAGNOSIS — H6123 Impacted cerumen, bilateral: Secondary | ICD-10-CM

## 2023-02-15 DIAGNOSIS — H6123 Impacted cerumen, bilateral: Secondary | ICD-10-CM | POA: Insufficient documentation

## 2023-02-15 DIAGNOSIS — R0982 Postnasal drip: Secondary | ICD-10-CM | POA: Insufficient documentation

## 2023-02-15 DIAGNOSIS — J310 Chronic rhinitis: Secondary | ICD-10-CM | POA: Insufficient documentation

## 2023-02-15 NOTE — Progress Notes (Signed)
Patient ID: Christian Ward, male   DOB: Mar 29, 1951, 72 y.o.   MRN: [id number]  Follow-up: Chronic nasal congestion, nasal drainage, recurrent cerumen impaction  HPI: The patient is a 72 year old male who returns today for his follow-up evaluation.  The patient was previously seen for multiple medical issues, including chronic nasal congestion, nasal drainage, recurrent cerumen impaction, and dizziness.  His dizziness has since resolved.  However, he continues to have frequent postnasal drainage and occasional nasal congestion.  His nasal symptoms are worse during the allergy seasons.  He is currently on Flonase and nasal saline irrigation.  He currently denies any facial pain, fever, or visual change.  The patient has also noted increasing clogging sensation in his ears over the past few weeks.  He denies any significant change in his hearing.  Exam: General: Communicates without difficulty, well nourished, no acute distress. Head: Normocephalic, no evidence injury, no tenderness, facial buttresses intact without stepoff. Face/sinus: No tenderness to palpation and percussion. Facial movement is normal and symmetric. Eyes: PERRL, EOMI. No scleral icterus, conjunctivae clear. Neuro: CN II exam reveals vision grossly intact.  No nystagmus at any point of gaze. Ears: Auricles well formed without lesions.  Bilateral cerumen impaction.  Nose: External evaluation reveals normal support and skin without lesions.  Dorsum is intact.  Anterior rhinoscopy reveals congested mucosa over anterior aspect of inferior turbinates and intact septum.  No purulence noted. Oral:  Oral cavity and oropharynx are intact, symmetric, without erythema or edema.  Mucosa is moist without lesions. Neck: Full range of motion without pain.  There is no significant lymphadenopathy.  No masses palpable.  Thyroid bed within normal limits to palpation.  Parotid glands and submandibular glands equal bilaterally without mass.  Trachea is midline.  Neuro:  CN 2-12 grossly intact.    Procedure: Bilateral cerumen disimpaction Anesthesia: None Description: Under the operating microscope, the cerumen is carefully removed with a combination of cerumen currette, alligator forceps, and suction catheters.  After the cerumen is removed, the TMs are noted to be normal.  No mass, erythema, or lesions. The patient tolerated the procedure well.    Assessment: 1.  Bilateral cerumen impaction.  After the disimpaction procedure, both tympanic membranes and middle ear spaces are noted to be normal. 2.  Chronic rhinitis with nasal mucosal congestion and chronic postnasal drainage.  Plan: 1.  Otomicroscopy with bilateral cerumen disimpaction. 2.  The physical exam findings are reviewed with the patient. 3.  Continue with Flonase nasal spray and nasal saline irrigation. 4.  The patient will benefit from an allergy evaluation and treatment.  The patient would like to follow-up with the [facility] practice. 5.  The patient will return for reevaluation in 8 to 9 months, sooner if needed.

## 2023-03-06 DIAGNOSIS — M25571 Pain in right ankle and joints of right foot: Secondary | ICD-10-CM | POA: Diagnosis not present

## 2023-03-29 DIAGNOSIS — M461 Sacroiliitis, not elsewhere classified: Secondary | ICD-10-CM | POA: Diagnosis not present

## 2023-04-05 ENCOUNTER — Other Ambulatory Visit: Payer: Self-pay | Admitting: Internal Medicine

## 2023-04-05 DIAGNOSIS — K279 Peptic ulcer, site unspecified, unspecified as acute or chronic, without hemorrhage or perforation: Secondary | ICD-10-CM

## 2023-04-05 DIAGNOSIS — K219 Gastro-esophageal reflux disease without esophagitis: Secondary | ICD-10-CM

## 2023-04-08 ENCOUNTER — Other Ambulatory Visit: Payer: Self-pay | Admitting: Internal Medicine

## 2023-04-08 DIAGNOSIS — K279 Peptic ulcer, site unspecified, unspecified as acute or chronic, without hemorrhage or perforation: Secondary | ICD-10-CM

## 2023-04-08 DIAGNOSIS — K219 Gastro-esophageal reflux disease without esophagitis: Secondary | ICD-10-CM

## 2023-04-19 DIAGNOSIS — S63502A Unspecified sprain of left wrist, initial encounter: Secondary | ICD-10-CM | POA: Diagnosis not present

## 2023-04-19 DIAGNOSIS — M25532 Pain in left wrist: Secondary | ICD-10-CM | POA: Diagnosis not present

## 2023-04-28 ENCOUNTER — Telehealth: Payer: Self-pay | Admitting: Internal Medicine

## 2023-04-28 NOTE — Telephone Encounter (Signed)
Prescription Request  04/28/2023  LOV: 10/20/2022  What is the name of the medication or equipment? omeprazole (PRILOSEC) 20 MG capsule  Have you contacted your pharmacy to request a refill? Yes   Which pharmacy would you like this sent to?   [id number] [place], [place] - [address] [place] [address] Phone: [phone] Fax: [phone]    Patient notified that their request is being sent to the clinical staff for review and that they should receive a response within 2 business days.   Please advise at [facility] [phone]

## 2023-05-02 ENCOUNTER — Other Ambulatory Visit: Payer: Self-pay | Admitting: Internal Medicine

## 2023-05-02 DIAGNOSIS — K279 Peptic ulcer, site unspecified, unspecified as acute or chronic, without hemorrhage or perforation: Secondary | ICD-10-CM

## 2023-05-02 DIAGNOSIS — K219 Gastro-esophageal reflux disease without esophagitis: Secondary | ICD-10-CM

## 2023-05-05 DIAGNOSIS — Z961 Presence of intraocular lens: Secondary | ICD-10-CM | POA: Diagnosis not present

## 2023-05-05 NOTE — Telephone Encounter (Signed)
Copied from CRM [id number]. Topic: Clinical - Prescription Issue >> May 04, 2023  4:33 PM Denese Killings wrote:  Reason for CRM: Patient is calling to check status of prescription refill. He states that he will be completely out prescription on Saturday. He states that he has been trying to fill it with the pharmacy for 2 weeks.

## 2023-05-09 ENCOUNTER — Encounter: Payer: Self-pay | Admitting: Internal Medicine

## 2023-05-09 ENCOUNTER — Ambulatory Visit: Payer: Medicare Other | Admitting: Internal Medicine

## 2023-05-09 VITALS — BP 130/82 | HR 69 | Temp 98.0°F | Resp 16 | Ht 76.0 in | Wt 216.6 lb

## 2023-05-09 DIAGNOSIS — Z0000 Encounter for general adult medical examination without abnormal findings: Secondary | ICD-10-CM

## 2023-05-09 DIAGNOSIS — K279 Peptic ulcer, site unspecified, unspecified as acute or chronic, without hemorrhage or perforation: Secondary | ICD-10-CM | POA: Diagnosis not present

## 2023-05-09 DIAGNOSIS — D539 Nutritional anemia, unspecified: Secondary | ICD-10-CM

## 2023-05-09 DIAGNOSIS — K219 Gastro-esophageal reflux disease without esophagitis: Secondary | ICD-10-CM | POA: Diagnosis not present

## 2023-05-09 DIAGNOSIS — J301 Allergic rhinitis due to pollen: Secondary | ICD-10-CM

## 2023-05-09 DIAGNOSIS — E785 Hyperlipidemia, unspecified: Secondary | ICD-10-CM | POA: Diagnosis not present

## 2023-05-09 DIAGNOSIS — Z0001 Encounter for general adult medical examination with abnormal findings: Secondary | ICD-10-CM

## 2023-05-09 LAB — CBC WITH DIFFERENTIAL/PLATELET
Basophils Absolute: 0 10*3/uL (ref 0.0–0.1)
Basophils Relative: 0.6 % (ref 0.0–3.0)
Eosinophils Absolute: 0.3 10*3/uL (ref 0.0–0.7)
Eosinophils Relative: 3.9 % (ref 0.0–5.0)
HCT: 38.6 % — ABNORMAL LOW (ref 39.0–52.0)
Hemoglobin: 12.8 g/dL — ABNORMAL LOW (ref 13.0–17.0)
Lymphocytes Relative: 17.5 % (ref 12.0–46.0)
Lymphs Abs: 1.1 10*3/uL (ref 0.7–4.0)
MCHC: 33.3 g/dL (ref 30.0–36.0)
MCV: 92.9 fL (ref 78.0–100.0)
Monocytes Absolute: 0.6 10*3/uL (ref 0.1–1.0)
Monocytes Relative: 9.1 % (ref 3.0–12.0)
Neutro Abs: 4.4 10*3/uL (ref 1.4–7.7)
Neutrophils Relative %: 68.9 % (ref 43.0–77.0)
Platelets: 194 10*3/uL (ref 150.0–400.0)
RBC: 4.15 Mil/uL — ABNORMAL LOW (ref 4.22–5.81)
RDW: 13.4 % (ref 11.5–15.5)
WBC: 6.4 10*3/uL (ref 4.0–10.5)

## 2023-05-09 LAB — HEPATIC FUNCTION PANEL
ALT: 28 U/L (ref 0–53)
AST: 25 U/L (ref 0–37)
Albumin: 3.9 g/dL (ref 3.5–5.2)
Alkaline Phosphatase: 79 U/L (ref 39–117)
Bilirubin, Direct: 0.2 mg/dL (ref 0.0–0.3)
Total Bilirubin: 0.9 mg/dL (ref 0.2–1.2)
Total Protein: 6.8 g/dL (ref 6.0–8.3)

## 2023-05-09 LAB — BASIC METABOLIC PANEL
BUN: 23 mg/dL (ref 6–23)
CO2: 29 meq/L (ref 19–32)
Calcium: 9.2 mg/dL (ref 8.4–10.5)
Chloride: 104 meq/L (ref 96–112)
Creatinine, Ser: 0.98 mg/dL (ref 0.40–1.50)
GFR: 77.4 mL/min (ref >60.00)
Glucose, Bld: 95 mg/dL (ref 70–99)
Potassium: 4 meq/L (ref 3.5–5.1)
Sodium: 140 meq/L (ref 135–145)

## 2023-05-09 LAB — LIPID PANEL
Cholesterol: 163 mg/dL (ref 0–200)
HDL: 42.8 mg/dL (ref >39.00)
LDL Cholesterol: 103 mg/dL — ABNORMAL HIGH (ref 0–99)
NonHDL: 120.31
Total CHOL/HDL Ratio: 4
Triglycerides: 85 mg/dL (ref 0.0–149.0)
VLDL: 17 mg/dL (ref 0.0–40.0)

## 2023-05-09 LAB — IBC + FERRITIN
Ferritin: 94 ng/mL (ref 22.0–322.0)
Iron: 91 ug/dL (ref 42–165)
Saturation Ratios: 29.5 % (ref 20.0–50.0)
TIBC: 308 ug/dL (ref 250.0–450.0)
Transferrin: 220 mg/dL (ref 212.0–360.0)

## 2023-05-09 LAB — VITAMIN B12: Vitamin B-12: 479 pg/mL (ref 211–911)

## 2023-05-09 LAB — MAGNESIUM: Magnesium: 2 mg/dL (ref 1.5–2.5)

## 2023-05-09 MED ORDER — ROSUVASTATIN CALCIUM 5 MG PO TABS: 5.0000 mg | ORAL_TABLET | Freq: Every day | ORAL | 0 refills | Status: DC

## 2023-05-09 MED ORDER — OMEPRAZOLE 20 MG PO CPDR: 20.0000 mg | DELAYED_RELEASE_CAPSULE | Freq: Two times a day (BID) | ORAL | 0 refills | Status: DC

## 2023-05-09 NOTE — Patient Instructions (Signed)
 Health Maintenance, Male  Adopting a healthy lifestyle and getting preventive care are important in promoting health and wellness. Ask your health care provider about:  The right schedule for you to have regular tests and exams.  Things you can do on your own to prevent diseases and keep yourself healthy.  What should I know about diet, weight, and exercise?  Eat a healthy diet    Eat a diet that includes plenty of vegetables, fruits, low-fat dairy products, and lean protein.  Do not eat a lot of foods that are high in solid fats, added sugars, or sodium.  Maintain a healthy weight  Body mass index (BMI) is a measurement that can be used to identify possible weight problems. It estimates body fat based on height and weight. Your health care provider can help determine your BMI and help you achieve or maintain a healthy weight.  Get regular exercise  Get regular exercise. This is one of the most important things you can do for your health. Most adults should:  Exercise for at least 150 minutes each week. The exercise should increase your heart rate and make you sweat (moderate-intensity exercise).  Do strengthening exercises at least twice a week. This is in addition to the moderate-intensity exercise.  Spend less time sitting. Even light physical activity can be beneficial.  Watch cholesterol and blood lipids  Have your blood tested for lipids and cholesterol at 71 years of age, then have this test every 5 years.  You may need to have your cholesterol levels checked more often if:  Your lipid or cholesterol levels are high.  You are older than 71 years of age.  You are at high risk for heart disease.  What should I know about cancer screening?  Many types of cancers can be detected early and may often be prevented. Depending on your health history and family history, you may need to have cancer screening at various ages. This may include screening for:  Colorectal cancer.  Prostate cancer.  Skin cancer.  Lung  cancer.  What should I know about heart disease, diabetes, and high blood pressure?  Blood pressure and heart disease  High blood pressure causes heart disease and increases the risk of stroke. This is more likely to develop in people who have high blood pressure readings or are overweight.  Talk with your health care provider about your target blood pressure readings.  Have your blood pressure checked:  Every 3-5 years if you are 24-52 years of age.  Every year if you are 3 years old or older.  If you are between the ages of 60 and 72 and are a current or former smoker, ask your health care provider if you should have a one-time screening for abdominal aortic aneurysm (AAA).  Diabetes  Have regular diabetes screenings. This checks your fasting blood sugar level. Have the screening done:  Once every three years after age 66 if you are at a normal weight and have a low risk for diabetes.  More often and at a younger age if you are overweight or have a high risk for diabetes.  What should I know about preventing infection?  Hepatitis B  If you have a higher risk for hepatitis B, you should be screened for this virus. Talk with your health care provider to find out if you are at risk for hepatitis B infection.  Hepatitis C  Blood testing is recommended for:  Everyone born from [birth year] through 1964.  Anyone  with known risk factors for hepatitis C.  Sexually transmitted infections (STIs)  You should be screened each year for STIs, including gonorrhea and chlamydia, if:  You are sexually active and are younger than 71 years of age.  You are older than 71 years of age and your health care provider tells you that you are at risk for this type of infection.  Your sexual activity has changed since you were last screened, and you are at increased risk for chlamydia or gonorrhea. Ask your health care provider if you are at risk.  Ask your health care provider about whether you are at high risk for HIV. Your health care provider  may recommend a prescription medicine to help prevent HIV infection. If you choose to take medicine to prevent HIV, you should first get tested for HIV. You should then be tested every 3 months for as long as you are taking the medicine.  Follow these instructions at home:  Alcohol use  Do not drink alcohol if your health care provider tells you not to drink.  If you drink alcohol:  Limit how much you have to 0-2 drinks a day.  Know how much alcohol is in your drink. In the [place] one drink equals one 12 oz bottle of beer (355 mL), one 5 oz glass of wine (148 mL), or one 1 oz glass of hard liquor (44 mL).  Lifestyle  Do not use any products that contain nicotine or tobacco. These products include cigarettes, chewing tobacco, and vaping devices, such as e-cigarettes. If you need help quitting, ask your health care provider.  Do not use street drugs.  Do not share needles.  Ask your health care provider for help if you need support or information about quitting drugs.  General instructions  Schedule regular health, dental, and eye exams.  Stay current with your vaccines.  Tell your health care provider if:  You often feel depressed.  You have ever been abused or do not feel safe at home.  Summary  Adopting a healthy lifestyle and getting preventive care are important in promoting health and wellness.  Follow your health care provider's instructions about healthy diet, exercising, and getting tested or screened for diseases.  Follow your health care provider's instructions on monitoring your cholesterol and blood pressure.  This information is not intended to replace advice given to you by your health care provider. Make sure you discuss any questions you have with your health care provider.  Document Revised: 09/14/2020 Document Reviewed: 09/14/2020  Elsevier Patient Education  2024 [employer].

## 2023-05-09 NOTE — Progress Notes (Signed)
 Subjective:  Patient ID: Christian Ward, male    DOB: 02/18/1951  Age: 72 y.o. MRN: [id number]  CC: Annual Exam, Anemia, Hypertension, Hyperlipidemia, and Gastroesophageal Reflux   HPI Christian Ward presents for a CPX and f/up ----  Discussed the use of AI scribe software for clinical note transcription with the patient, who gave verbal consent to proceed.  History of Present Illness   The patient, in his early 33, has been experiencing progressive allergic symptoms for the past several years, which have notably worsened over the last decade. Initially, the symptoms were seasonal, occurring for a few weeks during spring and fall, but have since extended to persist throughout the summer and into the fall. The patient describes a postnasal drip that flows from the sinus cavities down the back of the throat, which can be tasted and occasionally induces nausea if the stomach is empty. The patient has been managing these symptoms with Flonase  and occasionally an antihistamine, such as Allegra, when the postnasal drip becomes severe.  The patient is interested in a comprehensive allergy  workup to identify the cause of the symptoms and to explore these treatment options.  The patient also has a history of back trouble due to arthritis and has previously been diagnosed with an iron deficiency, which has since resolved. The patient is not currently taking any iron supplements. The patient maintains an active lifestyle and denies any chest pain or shortness of breath. The patient has a history of shingles and has been considering getting a COVID shot. The patient takes a senior vitamin supplement, vitamin D3, and occasionally vitamin C. The patient has also had a prostatectomy and sees Dr. Nicholaus for follow-ups every six months. The patient is due for a colonoscopy in the spring.       Outpatient Medications Prior to Visit  Medication Sig Dispense Refill   acetaminophen  (TYLENOL ) 500 MG tablet  Take 500 mg by mouth every 6 (six) hours as needed for mild pain.     CELEBREX 200 MG capsule Take 200 mg by mouth 2 (two) times daily as needed.     Cholecalciferol (VITAMIN D3 PO) Take 2,000 Units by mouth daily.     clonazePAM  (KLONOPIN ) 0.5 MG tablet TAKE 1 TABLET (0.5 MG TOTAL) BY MOUTH 2 (TWO) TIMES DAILY AS NEEDED FOR ANXIETY. 60 tablet 1   Dietary Management Product (VASCULERA) TABS Take 1 tablet by mouth daily. 30 tablet 11   fluticasone  (FLONASE ) 50 MCG/ACT nasal spray Place 1 spray into both nostrils daily as needed for allergies.      Multiple Vitamin (MULTIVITAMIN) tablet Take 1 tablet by mouth daily.     ondansetron  (ZOFRAN ) 4 MG tablet Take 4 mg by mouth every 8 (eight) hours as needed for nausea or vomiting.     polyethylene glycol (MIRALAX / GLYCOLAX) 17 g packet Take 17 g by mouth daily as needed for mild constipation.     pregabalin (LYRICA) 50 MG capsule Take 50 mg by mouth daily.     Probiotic Product (PROBIOTIC DAILY) CAPS Take 1 capsule by mouth daily.     promethazine  (PHENERGAN ) 25 MG tablet Take 25 mg by mouth every 6 (six) hours as needed for nausea or vomiting.     omeprazole  (PRILOSEC) 20 MG capsule Take 1 capsule (20 mg total) by mouth 2 (two) times daily before a meal. Takes 1 daily and may take a second dose if needed 180 capsule 1   No facility-administered medications prior to visit.  ROS Review of Systems  Constitutional:  Negative for appetite change, chills, diaphoresis and fatigue.  HENT:  Positive for postnasal drip and rhinorrhea. Negative for congestion, ear discharge, facial swelling, nosebleeds, sinus pain, sneezing, sore throat, tinnitus and trouble swallowing.   Eyes: Negative.  Negative for visual disturbance.  Respiratory: Negative.  Negative for cough, chest tightness, shortness of breath and wheezing.   Cardiovascular:  Negative for chest pain, palpitations and leg swelling.  Gastrointestinal:  Negative for abdominal pain, blood in stool,  constipation, diarrhea, nausea and vomiting.  Endocrine: Negative.   Genitourinary: Negative.  Negative for difficulty urinating.  Musculoskeletal:  Positive for back pain. Negative for arthralgias, joint swelling, myalgias and neck stiffness.  Skin: Negative.   Neurological: Negative.  Negative for dizziness, weakness, numbness and headaches.  Hematological:  Negative for adenopathy. Does not bruise/bleed easily.  Psychiatric/Behavioral: Negative.      Objective:  BP 130/82 (BP Location: Left Arm, Patient Position: Sitting, Cuff Size: Normal)   Pulse 69   Temp 98 F (36.7 C) (Oral)   Resp 16   Ht 6' 4 (1.93 m)   Wt 216 lb 9.6 oz (98.2 kg)   SpO2 97%   BMI 26.37 kg/m   BP Readings from Last 3 Encounters:  05/09/23 130/82  11/18/22 127/61  10/20/22 118/68    Wt Readings from Last 3 Encounters:  05/09/23 216 lb 9.6 oz (98.2 kg)  02/14/23 215 lb (97.5 kg)  11/18/22 213 lb 3.2 oz (96.7 kg)    Physical Exam Vitals reviewed.  Constitutional:      Appearance: Normal appearance.  HENT:     Nose: Nose normal.     Mouth/Throat:     Mouth: Mucous membranes are moist.  Eyes:     General: No scleral icterus.    Conjunctiva/sclera: Conjunctivae normal.  Cardiovascular:     Rate and Rhythm: Normal rate and regular rhythm.     Heart sounds: No murmur heard. Pulmonary:     Effort: Pulmonary effort is normal.     Breath sounds: No stridor. No wheezing, rhonchi or rales.  Abdominal:     General: Abdomen is flat.     Palpations: There is no mass.     Tenderness: There is no abdominal tenderness. There is no guarding.     Hernia: No hernia is present.  Musculoskeletal:        General: Normal range of motion.     Cervical back: Neck supple.     Right lower leg: No edema.     Left lower leg: No edema.  Lymphadenopathy:     Cervical: No cervical adenopathy.  Skin:    General: Skin is warm and dry.     Coloration: Skin is not pale.  Neurological:     General: No focal  deficit present.     Mental Status: He is alert. Mental status is at baseline.  Psychiatric:        Mood and Affect: Mood normal.        Behavior: Behavior normal.     Lab Results  Component Value Date   WBC 6.4 05/09/2023   HGB 12.8 (L) 05/09/2023   HCT 38.6 (L) 05/09/2023   PLT 194.0 05/09/2023   GLUCOSE 95 05/09/2023   CHOL 163 05/09/2023   TRIG 85.0 05/09/2023   HDL 42.80 05/09/2023   LDLCALC 103 (H) 05/09/2023   ALT 28 05/09/2023   AST 25 05/09/2023   NA 140 05/09/2023   K 4.0 05/09/2023  CL 104 05/09/2023   CREATININE 0.98 05/09/2023   BUN 23 05/09/2023   CO2 29 05/09/2023   TSH 0.93 10/20/2022   PSA 1.75 05/12/2022   INR 1.06 08/31/2016   HGBA1C 5.8 03/09/2021    CT CARDIAC SCORING ([facility] LOCATIONS ONLY) Result Date: 05/28/2022 CLINICAL DATA:  Family history. * Tracking Code: FCC * EXAM: CT CARDIAC CORONARY ARTERY CALCIUM  SCORE TECHNIQUE: Non-contrast imaging through the heart was performed using prospective ECG gating. Image post processing was performed on an independent workstation, allowing for quantitative analysis of the heart and coronary arteries. Note that this exam targets the heart and the chest was not imaged in its entirety. COMPARISON:  None available. FINDINGS: CORONARY CALCIUM  SCORES: Left Main: 0 LAD: 0 LCx: 2 RCA: 0 Total Agatston Score: 2 MESA database percentile: 16 AORTA MEASUREMENTS: Ascending Aorta: 32 cm Descending Aorta:28 cm OTHER FINDINGS: Heart is normal size. Aorta normal caliber. Punctate calcification in the aortic root. No adenopathy. No confluent opacities or effusions. No acute findings in the upper abdomen. Chest wall soft tissues are unremarkable. No acute bony abnormality. IMPRESSION: Total Agatston score: 2 [place] database percentile: 16 Punctate atherosclerotic calcification in the aortic root. No acute extra cardiac abnormality. Electronically Signed   By: Franky Crease M.D.   On: 05/28/2022 09:48    Assessment & Plan:   Deficiency  anemia- H/H are stable. Will screen for vitamin deficiencies. -     Vitamin B12; Future -     CBC with Differential/Platelet; Future -     IBC + Ferritin; Future -     Hemoccult Cards (X3 cards); Future -     Basic metabolic panel; Future  PUD (peptic ulcer disease) -     Omeprazole ; Take 1 capsule (20 mg total) by mouth 2 (two) times daily before a meal. Takes 1 daily and may take a second dose if needed  Dispense: 180 capsule; Refill: 0 -     CBC with Differential/Platelet; Future -     Hemoccult Cards (X3 cards); Future -     Basic metabolic panel; Future  Gastroesophageal reflux disease without esophagitis -     Omeprazole ; Take 1 capsule (20 mg total) by mouth 2 (two) times daily before a meal. Takes 1 daily and may take a second dose if needed  Dispense: 180 capsule; Refill: 0 -     Vitamin B12; Future -     CBC with Differential/Platelet; Future -     Magnesium; Future -     Hemoccult Cards (X3 cards); Future -     Basic metabolic panel; Future  Dyslipidemia, goal LDL below 100- Will start a statin for CV risk reduction. -     Lipid panel; Future -     Hepatic function panel; Future -     Basic metabolic panel; Future  Encounter for general adult medical examination with abnormal findings- Exam completed, labs reviewed, vaccines reviewed, cancer screenings are UTD, pt ed material was given.   Non-seasonal allergic rhinitis due to pollen -     Ambulatory referral to Allergy      Follow-up: Return in about 3 months (around 08/07/2023).  Debby Molt, MD

## 2023-05-12 ENCOUNTER — Encounter: Payer: Self-pay | Admitting: Internal Medicine

## 2023-05-12 MED ORDER — ROSUVASTATIN CALCIUM 5 MG PO TABS: 5.0000 mg | ORAL_TABLET | Freq: Every day | ORAL | 0 refills | Status: DC

## 2023-05-12 MED ORDER — ROSUVASTATIN CALCIUM 5 MG PO TABS: 5.0000 mg | ORAL_TABLET | Freq: Every day | ORAL | 1 refills | Status: DC

## 2023-06-08 ENCOUNTER — Other Ambulatory Visit: Payer: Self-pay

## 2023-06-08 ENCOUNTER — Ambulatory Visit: Payer: Medicare Other | Admitting: Internal Medicine

## 2023-06-08 ENCOUNTER — Encounter: Payer: Self-pay | Admitting: Internal Medicine

## 2023-06-08 VITALS — BP 134/72 | HR 62 | Temp 98.6°F | Ht 75.0 in | Wt 214.0 lb

## 2023-06-08 DIAGNOSIS — J3089 Other allergic rhinitis: Secondary | ICD-10-CM

## 2023-06-08 MED ORDER — FLUTICASONE PROPIONATE 50 MCG/ACT NA SUSP: 2.0000 | Freq: Every day | NASAL | 5 refills | Status: DC

## 2023-06-08 NOTE — Progress Notes (Signed)
NEW PATIENT  Date of Service/Encounter:  06/08/23  Consult requested by: Etta Grandchild, MD   Subjective:   Christian Ward (DOB: April 03, 1951) is a 73 y.o. male who presents to the clinic on 06/08/2023 with a chief complaint of Allergic Rhinitis , Sinus Problem, and Establish Care .    History obtained from: chart review and patient.   Rhinitis:  Started around age 30s.   Symptoms include: nasal congestion, rhinorrhea, and post nasal drainage  Stomach upset from post nasal drip.   Occurs year-round with seasonal flares in Spring/Fall  Potential triggers: not sure  Treatments tried:  Flonase Afrin sometimes  Non drowsy anti histamine possibly Allegra   Previous allergy testing: no History of sinus surgery: no Nonallergic triggers: none    Reviewed:  02/14/2023: seen by ENT Dr Suszanne Conners for congestion, draiange, dizziness.  On Flonase/saline rinses. Underwent disimpaction for bl cerumen.  Continue Flonase.  Referred to Allergy for further evaluation.   11/24/2022: seen by [facility] Dr Earlene Plater for prostate tumor, following PSA. Underwent surgery and radiation in past.  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   S/P radiation therapy    Shoulder pain, left     Past Surgical History: Past Surgical History:  Procedure Laterality Date   APPENDECTOMY  age 41   CHOLECYSTECTOMY  s/p lap chole 4/07   Dr Derrell Lolling   COLONOSCOPY     PROSTATE BIOPSY  2008, 10/2008   PROSTATECTOMY  08/06/2009   Gleason 4+3=7   SACROILIAC JOINT FUSION Right 09/08/2016   Procedure: RIGHT SIDED SACROILIAC JOINT FUSION;  Surgeon: Estill Bamberg, MD;  Location: [facility] OR;  Service:  Orthopedics;  Laterality: Right;  RIGHT SIDED SACROILIAC JOINT FUSION   WISDOM TOOTH EXTRACTION      Family History: Family History  Problem Relation Age of Onset   Hypertension Father    Cancer Father        prostate   Stroke Father    COPD Father    Coronary artery disease Father    Lung disease Father    Heart disease Father    Diabetes Father    Lung disease Mother    Diabetes Sister    Cancer Brother        surgery    Social History:  Flooring in bedroom: carpet Pets: dog Tobacco use/exposure: none  Medication List:  Allergies as of 06/08/2023       Reactions   Tramadol Nausea And Vomiting   Oxycodone Nausea Only        Medication List        Accurate as of June 08, 2023  2:48 PM. If you have any questions, ask your nurse or doctor.          acetaminophen 500 MG tablet Commonly known as: TYLENOL Take 500 mg by mouth every 6 (six) hours as needed for mild pain.   CeleBREX 200 MG capsule Generic drug: celecoxib Take 200 mg by mouth 2 (two) times daily as needed.   clonazePAM 0.5 MG tablet Commonly known as: KLONOPIN TAKE 1 TABLET (0.5 MG TOTAL) BY MOUTH  2 (TWO) TIMES DAILY AS NEEDED FOR ANXIETY.   fluticasone 50 MCG/ACT nasal spray Commonly known as: FLONASE Place 1 spray into both nostrils daily as needed for allergies.   multivitamin tablet Take 1 tablet by mouth daily.   omeprazole 20 MG capsule Commonly known as: PRILOSEC Take 1 capsule (20 mg total) by mouth 2 (two) times daily before a meal. Takes 1 daily and may take a second dose if needed   ondansetron 4 MG tablet Commonly known as: ZOFRAN Take 4 mg by mouth every 8 (eight) hours as needed for nausea or vomiting.   polyethylene glycol 17 g packet Commonly known as: MIRALAX / GLYCOLAX Take 17 g by mouth daily as needed for mild constipation.   pregabalin 50 MG capsule Commonly known as: LYRICA Take 50 mg by mouth daily.   Probiotic Daily Caps Take 1 capsule by mouth  daily.   promethazine 25 MG tablet Commonly known as: PHENERGAN Take 25 mg by mouth every 6 (six) hours as needed for nausea or vomiting.   rosuvastatin 5 MG tablet Commonly known as: Crestor Take 1 tablet (5 mg total) by mouth daily.   Vasculera Tabs Take 1 tablet by mouth daily.   VITAMIN D3 PO Take 2,000 Units by mouth daily.         REVIEW OF SYSTEMS: Pertinent positives and negatives discussed in HPI.   Objective:   Physical Exam: BP 134/72   Pulse 62   Temp 98.6 F (37 C)   Ht 6\' 3"  (1.905 m)   Wt 214 lb (97.1 kg)   SpO2 98%   BMI 26.75 kg/m  Body mass index is 26.75 kg/m. GEN: alert, well developed HEENT: clear conjunctiva, nose with + mild inferior turbinate hypertrophy, pink nasal mucosa, + clear rhinorrhea, + cobblestoning HEART: regular rate and rhythm, no murmur LUNGS: clear to auscultation bilaterally, no coughing, unlabored respiration ABDOMEN: soft, non distended  SKIN: no rashes or lesions   Assessment:   1. Other allergic rhinitis     Plan/Recommendations:  Other Allergic Rhinitis: - Due to turbinate hypertrophy, seasonal symptoms and unresponsive to over the counter meds, will perform skin testing to identify aeroallergen triggers.   - Use nasal saline rinses before nose sprays such as with Neilmed Sinus Rinse.  Use distilled water.   - Use Flonase 2 sprays each nostril daily. Aim upward and outward. - Bring all of your allergy medications with you at next visit.     Hold all anti-histamines (Xyzal, Allegra, Zyrtec, Claritin, Benadryl, Pepcid) 3 days prior to next visit.   Follow up: 2/5 at 11 AM for skin testing 1-55, IDs okay     Alesia Morin, MD [facility]

## 2023-06-08 NOTE — Patient Instructions (Addendum)
Other Allergic Rhinitis: - Use nasal saline rinses before nose sprays such as with Neilmed Sinus Rinse.  Use distilled water.   - Use Flonase 2 sprays each nostril daily. Aim upward and outward. - Bring all of your allergy medications with you at next visit.     Hold all anti-histamines (Xyzal, Allegra, Zyrtec, Claritin, Benadryl, Pepcid) 3 days prior to next visit.   Follow up: 2/5 at 11 AM for skin testing 1-55

## 2023-06-14 ENCOUNTER — Ambulatory Visit: Payer: Medicare Other | Admitting: Internal Medicine

## 2023-06-14 DIAGNOSIS — J3089 Other allergic rhinitis: Secondary | ICD-10-CM | POA: Diagnosis not present

## 2023-06-14 MED ORDER — IPRATROPIUM BROMIDE 0.06 % NA SOLN: 2.0000 | Freq: Four times a day (QID) | NASAL | 5 refills | Status: AC | PRN

## 2023-06-14 NOTE — Progress Notes (Signed)
 FOLLOW UP Date of Service/Encounter:  06/14/23   Subjective:  Christian Ward (DOB: [birth year]) is a 73 y.o. male who [facility] for follow up for skin testing.   History obtained from: chart review and patient.  Anti histamines held.   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 There is no height or weight on file to calculate BMI. Physical Exam: GEN: alert, well developed HEENT: clear conjunctiva, MMM LUNGS: unlabored respiration  Skin Testing:  Skin prick testing was placed, which includes aeroallergens/foods, histamine control, and saline control.  Verbal consent was obtained prior to placing test.  Patient tolerated procedure well.  Allergy  testing results were read and interpreted by myself, documented by clinical staff. Adequate positive and negative control.  Positive results to:  Results discussed with patient/family.  Airborne Adult Perc - 06/14/23 1147     Time Antigen Placed 1147    Allergen Manufacturer Jestine    Location Back    Number of Test 55    2. Control-Histamine 3+    3. Bahia Negative    4. Bermuda Negative    5. Johnson Negative    6. [place]  Blue Negative    7. Meadow Fescue Negative    8. Perennial Rye Negative    9. Timothy Negative    10. Ragweed Mix Negative    11. Cocklebur Negative    12. Plantain,  English Negative    13. Baccharis Negative    14. Dog Fennel Negative    15. Russian Thistle Negative    16. Lamb's Quarters Negative    17. Sheep Sorrell  Negative    18. Rough Pigweed Negative    19. Marsh Elder, Rough Negative    20. Mugwort, Common Negative    21. Box, Elder Negative    22. Cedar, red Negative    23. Sweet Gum Negative    24. Pecan Pollen Negative    25. Pine Mix Negative    26. Walnut, Black Pollen Negative    27. Red Mulberry Negative    28. Ash Mix Negative    29. Birch Mix Negative    30. Beech American Negative    31. Cottonwood, Eastern Negative    32. Hickory, White Negative    33. Maple Mix Negative    34. Oak, Eastern Mix Negative    35. Sycamore Eastern Negative    36. Alternaria Alternata Negative    37. Cladosporium Herbarum Negative    38. Aspergillus Mix Negative    39. Penicillium Mix Negative    40. Bipolaris Sorokiniana (Helminthosporium) Negative    41. Drechslera Spicifera (Curvularia) Negative    42. Mucor Plumbeus Negative    43. Fusarium Moniliforme Negative    44. Aureobasidium Pullulans (pullulara) Negative    45. Rhizopus Oryzae Negative    46. Botrytis Cinera Negative    47. Epicoccum Nigrum Negative    48. Phoma Betae Negative  49. Dust Mite Mix Negative    50. Cat Hair 10,000 BAU/ml Negative    51.  Dog Epithelia Negative    52. Mixed Feathers Negative    53. Horse Epithelia Negative    54. Cockroach, German Negative    55. Tobacco Leaf Negative             Intradermal - 06/14/23 1217     Time Antigen Placed 1217    Allergen Manufacturer Jestine    Location Arm    Number of Test 16    Intradermal Select    Control Negative    Bahia Negative    Bermuda Negative    Johnson Negative    7 Grass Negative    Ragweed Mix Negative    Weed Mix Negative    Tree Mix Negative    Mold 1 Negative    Mold 2 Negative    Mold 3 Negative    Mold 4 Negative    Mite Mix Negative    Cat Negative    Dog Negative    Cockroach Negative              Assessment:   1. Other allergic rhinitis     Plan/Recommendations:   Chronic Rhinitis  - SPT 06/2023: negative  -  Use nasal saline rinses before nose sprays such as with Neilmed Sinus Rinse as needed to clean out the nose and sinuses.  Use distilled water.   - Use Flonase  2 sprays each nostril daily. Aim upward and outward. - Use Ipratroprium 1-2 sprays up to four times daily as needed for runny nose. Aim upward and outward.  Use at least prior to bedtime since your symptoms are worse at night.  - Use Allegra 180mg  daily as needed for runny nose, sneezing, itchy watery eyes.  - Can stop nasal cromolyn spray as it not helping and tends to be useful in allergic driven rhinitis.  - He was asking regarding surgical treatment and discussed some ENT centers do nerve ablation for this but not sure of outcomes.     Return if symptoms worsen or fail to improve.  Arleta Blanch, MD [facility]  

## 2023-06-14 NOTE — Patient Instructions (Signed)
 Chronic Rhinitis: - SPT 06/2023: negative  - Use nasal saline rinses before nose sprays such as with Neilmed Sinus Rinse as needed to clean out the nose and sinuses.  Use distilled water.   - Use Flonase  2 sprays each nostril daily. Aim upward and outward. - Use Ipratroprium 1-2 sprays up to four times daily as needed for runny nose. Aim upward and outward.  Use at least prior to bedtime since your symptoms are worse at night.  - Use Allegra 180mg  daily as needed for runny nose, sneezing, itchy watery eyes.

## 2023-06-22 LAB — PSA: PSA: 7.6

## 2023-06-26 DIAGNOSIS — M461 Sacroiliitis, not elsewhere classified: Secondary | ICD-10-CM | POA: Diagnosis not present

## 2023-07-07 ENCOUNTER — Encounter (HOSPITAL_COMMUNITY): Payer: Self-pay | Admitting: Urology

## 2023-07-10 ENCOUNTER — Other Ambulatory Visit (HOSPITAL_COMMUNITY): Payer: Self-pay | Admitting: Urology

## 2023-07-10 DIAGNOSIS — C61 Malignant neoplasm of prostate: Secondary | ICD-10-CM

## 2023-07-19 ENCOUNTER — Encounter (HOSPITAL_COMMUNITY)

## 2023-07-26 ENCOUNTER — Encounter (HOSPITAL_COMMUNITY)
Admission: RE | Admit: 2023-07-26 | Discharge: 2023-07-26 | Disposition: A | Discharge status: Home / Self Care | Source: Ambulatory Visit | Attending: Urology | Admitting: Urology

## 2023-07-26 DIAGNOSIS — C61 Malignant neoplasm of prostate: Secondary | ICD-10-CM | POA: Insufficient documentation

## 2023-08-01 ENCOUNTER — Ambulatory Visit (HOSPITAL_COMMUNITY)
Admission: RE | Admit: 2023-08-01 | Discharge: 2023-08-01 | Disposition: A | Discharge status: Home / Self Care | Source: Ambulatory Visit | Attending: Urology | Admitting: Urology

## 2023-08-01 DIAGNOSIS — C61 Malignant neoplasm of prostate: Secondary | ICD-10-CM | POA: Diagnosis present

## 2023-08-01 MED ORDER — FLOTUFOLASTAT F 18 GALLIUM 296-5846 MBQ/ML IV SOLN
7.7400 | Freq: Once | INTRAVENOUS | Status: AC
Start: 2023-08-01 — End: 2023-08-01
  Administered 2023-08-01: 7.74 via INTRAVENOUS

## 2023-08-03 ENCOUNTER — Other Ambulatory Visit: Payer: Self-pay | Admitting: Internal Medicine

## 2023-08-03 DIAGNOSIS — K279 Peptic ulcer, site unspecified, unspecified as acute or chronic, without hemorrhage or perforation: Secondary | ICD-10-CM

## 2023-08-03 DIAGNOSIS — K219 Gastro-esophageal reflux disease without esophagitis: Secondary | ICD-10-CM

## 2023-08-08 DIAGNOSIS — Z85828 Personal history of other malignant neoplasm of skin: Secondary | ICD-10-CM | POA: Diagnosis not present

## 2023-08-08 DIAGNOSIS — L570 Actinic keratosis: Secondary | ICD-10-CM | POA: Diagnosis not present

## 2023-08-08 DIAGNOSIS — L720 Epidermal cyst: Secondary | ICD-10-CM | POA: Diagnosis not present

## 2023-08-08 DIAGNOSIS — D1801 Hemangioma of skin and subcutaneous tissue: Secondary | ICD-10-CM | POA: Diagnosis not present

## 2023-08-08 DIAGNOSIS — L821 Other seborrheic keratosis: Secondary | ICD-10-CM | POA: Diagnosis not present

## 2023-08-08 DIAGNOSIS — D485 Neoplasm of uncertain behavior of skin: Secondary | ICD-10-CM | POA: Diagnosis not present

## 2023-08-08 DIAGNOSIS — L910 Hypertrophic scar: Secondary | ICD-10-CM | POA: Diagnosis not present

## 2023-08-08 DIAGNOSIS — L110 Acquired keratosis follicularis: Secondary | ICD-10-CM | POA: Diagnosis not present

## 2023-08-15 ENCOUNTER — Encounter (INDEPENDENT_AMBULATORY_CARE_PROVIDER_SITE_OTHER): Payer: Self-pay

## 2023-08-15 ENCOUNTER — Ambulatory Visit (INDEPENDENT_AMBULATORY_CARE_PROVIDER_SITE_OTHER): Payer: Medicare Other

## 2023-08-15 VITALS — BP 134/74 | HR 61 | Ht 76.0 in | Wt 215.0 lb

## 2023-08-15 DIAGNOSIS — J310 Chronic rhinitis: Secondary | ICD-10-CM | POA: Diagnosis not present

## 2023-08-15 DIAGNOSIS — H6123 Impacted cerumen, bilateral: Secondary | ICD-10-CM | POA: Diagnosis not present

## 2023-08-15 DIAGNOSIS — R0982 Postnasal drip: Secondary | ICD-10-CM

## 2023-08-15 DIAGNOSIS — R0981 Nasal congestion: Secondary | ICD-10-CM | POA: Diagnosis not present

## 2023-08-16 NOTE — Progress Notes (Signed)
 Patient ID: Christian Ward, male   DOB: February 15, 1951, 73 y.o.   MRN: [id number]  Follow-up: Recurrent cerumen impaction, chronic nasal congestion, postnasal drainage  HPI: The patient is a 73 year old male who returns today for his follow-up evaluation.  The patient has a history of chronic nasal congestion, nasal drainage, and recurrent cerumen impaction.  He was previously treated with Flonase nasal spray, nasal saline irrigation, and Atrovent nasal spray.  The patient returns today reporting intermittent nasal congestion.  The severity of his congestion and nasal drainage have decreased with the medical treatment.  Currently he denies any facial pain, fever, or visual change.  Exam: General: Communicates without difficulty, well nourished, no acute distress. Head: Normocephalic, no evidence injury, no tenderness, facial buttresses intact without stepoff. Face/sinus: No tenderness to palpation and percussion. Facial movement is normal and symmetric. Eyes: PERRL, EOMI. No scleral icterus, conjunctivae clear. Neuro: CN II exam reveals vision grossly intact.  No nystagmus at any point of gaze. Ears: Auricles well formed without lesions.  Bilateral cerumen impaction.  Nose: External evaluation reveals normal support and skin without lesions.  Dorsum is intact.  Anterior rhinoscopy reveals congested mucosa over anterior aspect of inferior turbinates and intact septum.  Oral:  Oral cavity and oropharynx are intact, symmetric, without erythema or edema.  Mucosa is moist without lesions. Neck: Full range of motion without pain.  There is no significant lymphadenopathy.  No masses palpable.  Thyroid bed within normal limits to palpation.  Parotid glands and submandibular glands equal bilaterally without mass.  Trachea is midline. Neuro:  CN 2-12 grossly intact.     Procedure: Bilateral cerumen disimpaction Anesthesia: None Description: Under the operating microscope, the cerumen is carefully removed with a  combination of cerumen currette, alligator forceps, and suction catheters.  After the cerumen is removed, the TMs are noted to be normal.  No mass, erythema, or lesions. The patient tolerated the procedure well.     Assessment: 1.  Recurrent bilateral cerumen impaction.  After the disimpaction procedure, both tympanic membranes and middle ear spaces are noted to be normal. 2.  Chronic rhinitis with nasal mucosal congestion and chronic postnasal drainage.  The severity of his symptoms have decreased with the medical treatment.   Plan: 1.  Otomicroscopy with bilateral cerumen disimpaction. 2.  The physical exam findings are reviewed with the patient. 3.  Continue with Flonase nasal spray and nasal saline irrigation. 4.  The patient will return for reevaluation in 6 months.

## 2023-08-18 ENCOUNTER — Telehealth: Payer: Self-pay | Admitting: Radiation Oncology

## 2023-08-18 NOTE — Telephone Encounter (Signed)
LVM to schedule CON with Dr. Manning.  

## 2023-08-28 DIAGNOSIS — L0889 Other specified local infections of the skin and subcutaneous tissue: Secondary | ICD-10-CM | POA: Diagnosis not present

## 2023-08-29 NOTE — Progress Notes (Signed)
 GU Location of Tumor / Histology: Prostate Ca recurrent  If Prostate Cancer, Gleason Score is (4 + 3) and PSA is (7.6 06/22/2023)  Robot-Assisted Laparoscopic Radical Prostatectomy with bilateral pelvic lymphadenectomy on 08/05/09   Christian Ward Self presented as referral from Dr. Kristeen Peto III ( [facility]) elevated PSA.    Past/Anticipated interventions by urology, if any:   Dr. Nina Basil III Return in about 6 months (around 12/20/2023).   Past/Anticipated interventions by medical oncology, if any:  NA  Weight changes, if any:  No  IPSS:  13 SHIM:  13  Bowel/Bladder complaints, if any:  Urinary leakage comes/goes when squats.  Constipation off/ on is taking Miralax.  Nausea/Vomiting, if any:   Occasional nausea but no vomiting.  Has medication uses as needed.  Pain issues, if any:  0/10  SAFETY ISSUES: Prior radiation?  Yes,  prostate 2015-16. (10 weeks) Pacemaker/ICD? No Possible current pregnancy? Male Is the patient on methotrexate? No  Current Complaints / other details:

## 2023-09-05 ENCOUNTER — Encounter: Payer: Self-pay | Admitting: Radiation Oncology

## 2023-09-05 ENCOUNTER — Ambulatory Visit
Admission: RE | Admit: 2023-09-05 | Discharge: 2023-09-05 | Disposition: A | Discharge status: Home / Self Care | Source: Ambulatory Visit | Attending: Radiation Oncology | Admitting: Radiation Oncology

## 2023-09-05 VITALS — BP 158/74 | HR 63 | Temp 97.5°F | Resp 18 | Ht 76.0 in | Wt 221.2 lb

## 2023-09-05 DIAGNOSIS — K589 Irritable bowel syndrome without diarrhea: Secondary | ICD-10-CM | POA: Insufficient documentation

## 2023-09-05 DIAGNOSIS — Z801 Family history of malignant neoplasm of trachea, bronchus and lung: Secondary | ICD-10-CM | POA: Diagnosis not present

## 2023-09-05 DIAGNOSIS — E7800 Pure hypercholesterolemia, unspecified: Secondary | ICD-10-CM | POA: Diagnosis not present

## 2023-09-05 DIAGNOSIS — K219 Gastro-esophageal reflux disease without esophagitis: Secondary | ICD-10-CM | POA: Insufficient documentation

## 2023-09-05 DIAGNOSIS — N9989 Other postprocedural complications and disorders of genitourinary system: Secondary | ICD-10-CM

## 2023-09-05 DIAGNOSIS — Z791 Long term (current) use of non-steroidal anti-inflammatories (NSAID): Secondary | ICD-10-CM | POA: Diagnosis not present

## 2023-09-05 DIAGNOSIS — Z8042 Family history of malignant neoplasm of prostate: Secondary | ICD-10-CM | POA: Diagnosis not present

## 2023-09-05 DIAGNOSIS — Z809 Family history of malignant neoplasm, unspecified: Secondary | ICD-10-CM | POA: Diagnosis not present

## 2023-09-05 DIAGNOSIS — Z79899 Other long term (current) drug therapy: Secondary | ICD-10-CM | POA: Diagnosis not present

## 2023-09-05 DIAGNOSIS — Z923 Personal history of irradiation: Secondary | ICD-10-CM | POA: Insufficient documentation

## 2023-09-05 DIAGNOSIS — M797 Fibromyalgia: Secondary | ICD-10-CM | POA: Diagnosis not present

## 2023-09-05 DIAGNOSIS — Z87891 Personal history of nicotine dependence: Secondary | ICD-10-CM | POA: Diagnosis not present

## 2023-09-05 DIAGNOSIS — Z191 Hormone sensitive malignancy status: Secondary | ICD-10-CM | POA: Diagnosis not present

## 2023-09-05 DIAGNOSIS — C61 Malignant neoplasm of prostate: Secondary | ICD-10-CM | POA: Insufficient documentation

## 2023-09-05 DIAGNOSIS — K5730 Diverticulosis of large intestine without perforation or abscess without bleeding: Secondary | ICD-10-CM

## 2023-09-05 DIAGNOSIS — N393 Stress incontinence (female) (male): Secondary | ICD-10-CM | POA: Diagnosis not present

## 2023-09-05 NOTE — Progress Notes (Signed)
 Radiation Oncology         [phone]) [phone] ________________________________  Initial outpatient Consultation  Name: Christian Ward MRN: [id number]  Date of Service: 09/05/2023 DOB: August 31, 1950  WJ:XBJYN, Randa Burton, MD  Orvan Blanch, MD   REFERRING PHYSICIAN: Orvan Blanch, MD  DIAGNOSIS: 73 y/o man with locally recurrent prostate cancer within the prostatic fossa with a PSA of 7.6 s/p RALP in 2011 for pT3aN0, Gleason 4+3 adenocarcinoma of the prostate and salvage fossa XRT in 2015 for biochemical recurrence.    ICD-10-CM   1. Prostate carcinoma, recurrent (HCC)  C61     2. Diverticulosis of colon without hemorrhage  K57.30     3. 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 male seen at the request of Dr. Nolon Baxter. He was initially diagnosed with Gleason 4+3 prostate cancer back in 2006 with a PSA of 5.25. He was initially followed in Active Surveillance with periodic repeat biopsies and ultimately elected to proceed with RALP on 08/06/09 under the care of Dr. Nolon Baxter. A repeat PSA prior to the procedure had increased to 7.04. Final surgical pathology revealed stage pT3aN0, Gleason 4+3, prostate cancer with focal extracapsular extension and focal margin involvement. All three biopsied lymph nodes and both seminal vesicles were negative. His postoperative PSA was very low detectable at 0.07, but this increased to 0.5 by 11/2013. He was seen by Dr. Ike Malady in 12/2013 and was treated with 7.5 weeks of EBRT from 03/12/14 through 04/29/14. His PSA subsequently reached a nadir of 0.01 in 08/2014 but again continued to gradually rise thereafter, reaching 2.42 in 11/2022, before significantly increasing to 7.6 on repeat labs in 06/2023. This prompted a restaging PSMA PET scan on 08/01/23, which showed intense radiotracer activity between the base of the bladder and base of penis without  evidence of metastatic adenopathy, visceral metastasis, or skeletal metastasis.  He has been kindly referred to us  today for discussion of potential radiation treatment options.  PREVIOUS RADIATION THERAPY: Yes  03/12/14 - 04/29/14: Prostate fossa, 6600 cGy in 33 sessions (Dr. Ike Malady)   PAST MEDICAL HISTORY:  Past Medical History:  Diagnosis Date   Acid reflux    Cancer (HCC)    Chronic recurrent bronchiolitis (HCC)    Complication of anesthesia    trouble waking up   Fibromyalgia    H/O infectious mononucleosis    History of shingles    Hypercholesterolemia    IBS (irritable bowel syndrome)    Prostate cancer (HCC) 02/27/2007   Gleason 4+3=7   S/P radiation therapy    Shoulder pain, left       PAST SURGICAL HISTORY: Past Surgical History:  Procedure Laterality Date   APPENDECTOMY  age 89   CHOLECYSTECTOMY  s/p lap chole 4/07   Dr Lauralee Poll   COLONOSCOPY     PROSTATE BIOPSY  2008, 10/2008   PROSTATECTOMY  08/06/2009   Gleason 4+3=7   SACROILIAC JOINT FUSION Right 09/08/2016   Procedure: RIGHT SIDED SACROILIAC JOINT FUSION;  Surgeon: Virl Grimes, MD;  Location: [facility] OR;  Service: Orthopedics;  Laterality: Right;  RIGHT SIDED SACROILIAC JOINT FUSION   WISDOM TOOTH EXTRACTION      FAMILY HISTORY:  Family History  Problem Relation Age of Onset   Hypertension Father    Cancer Father        prostate   Stroke  Father    COPD Father    Coronary artery disease Father    Lung disease Father    Heart disease Father    Diabetes Father    Lung disease Mother    Diabetes Sister    Cancer Brother        surgery    SOCIAL HISTORY:  Social History   Socioeconomic History   Marital status: Single    Spouse name: Not on file   Number of children: Not on file   Years of education: Not on file   Highest education level: Not on file  Occupational History   Not on file  Tobacco Use   Smoking status: Former    Current packs/day: 0.00    Average packs/day: 0.5 packs/day for 10.0  years (5.0 ttl pk-yrs)    Types: Cigarettes    Start date: 05/09/1980    Quit date: 03/16/1986    Years since quitting: 37.5   Smokeless tobacco: Never  Vaping Use   Vaping status: Never Used  Substance and Sexual Activity   Alcohol use: Yes    Alcohol/week: 1.0 standard drink of alcohol    Types: 1 Glasses of wine per week    Comment: social drinker   Drug use: No   Sexual activity: Not Currently  Other Topics Concern   Not on file  Social History Narrative   Not on file   Social Drivers of Health   Financial Resource Strain: Not on file  Food Insecurity: No Food Insecurity (09/05/2023)   Hunger Vital Sign    Worried About Running Out of Food in the Last Year: Never true    Ran Out of Food in the Last Year: Never true  Transportation Needs: No Transportation Needs (09/05/2023)   PRAPARE - Administrator, Civil Service (Medical): No    Lack of Transportation (Non-Medical): No  Physical Activity: Not on file  Stress: Not on file  Social Connections: Not on file  Intimate Partner Violence: Not At Risk (09/05/2023)   Humiliation, Afraid, Rape, and Kick questionnaire    Fear of Current or Ex-Partner: No    Emotionally Abused: No    Physically Abused: No    Sexually Abused: No    ALLERGIES: Tramadol and Oxycodone   MEDICATIONS:  Current Outpatient Medications  Medication Sig Dispense Refill   CELEBREX 200 MG capsule Take 200 mg by mouth daily.     Cholecalciferol (VITAMIN D3 PO) Take 2,000 Units by mouth daily.     clonazePAM  (KLONOPIN ) 0.5 MG tablet TAKE 1 TABLET (0.5 MG TOTAL) BY MOUTH 2 (TWO) TIMES DAILY AS NEEDED FOR ANXIETY. 60 tablet 1   fluticasone  (FLONASE ) 50 MCG/ACT nasal spray Place 2 sprays into both nostrils daily. 16 g 5   Multiple Vitamin (MULTIVITAMIN) tablet Take 1 tablet by mouth daily.     omeprazole  (PRILOSEC) 20 MG capsule Take 1 capsule (20 mg total) by mouth 2 (two) times daily before a meal. Schedule an appt for further refills 60 capsule 0    pregabalin (LYRICA) 50 MG capsule Take 50 mg by mouth as needed.     Probiotic Product (PROBIOTIC DAILY) CAPS Take 1 capsule by mouth daily.     acetaminophen  (TYLENOL ) 500 MG tablet Take 500 mg by mouth every 6 (six) hours as needed for mild pain.     Dietary Management Product (VASCULERA) TABS Take 1 tablet by mouth daily. 30 tablet 11   ipratropium (ATROVENT ) 0.06 % nasal spray Place 2 sprays  into both nostrils 4 (four) times daily as needed for rhinitis. 15 mL 5   ondansetron  (ZOFRAN ) 4 MG tablet Take 4 mg by mouth every 8 (eight) hours as needed for nausea or vomiting.     polyethylene glycol (MIRALAX / GLYCOLAX) 17 g packet Take 17 g by mouth daily as needed for mild constipation.     promethazine  (PHENERGAN ) 25 MG tablet Take 25 mg by mouth every 6 (six) hours as needed for nausea or vomiting.     rosuvastatin  (CRESTOR ) 5 MG tablet Take 1 tablet (5 mg total) by mouth daily. (Patient not taking: Reported on 09/05/2023) 90 tablet 1   No current facility-administered medications for this encounter.    REVIEW OF SYSTEMS:  On review of systems, the patient reports that he is doing well overall. He denies any chest pain, shortness of breath, cough, fevers, chills, night sweats, unintended weight changes. He denies any bowel or bladder disturbances, and denies abdominal pain, nausea or vomiting. He does have some mild SUI that had initially resolved postoperatively but returned and has been gradually increasing over the past 3 years. He denies any new musculoskeletal or joint aches or pains. A complete review of systems is obtained and is otherwise negative.    PHYSICAL EXAM:  Wt Readings from Last 3 Encounters:  09/05/23 221 lb 3.2 oz (100.3 kg)  08/15/23 215 lb (97.5 kg)  06/08/23 214 lb (97.1 kg)   Temp Readings from Last 3 Encounters:  09/05/23 (!) 97.5 F (36.4 C)  06/08/23 98.6 F (37 C)  05/09/23 98 F (36.7 C) (Oral)   BP Readings from Last 3 Encounters:  09/05/23 (!) 158/74   08/15/23 134/74  06/08/23 134/72   Pulse Readings from Last 3 Encounters:  09/05/23 63  08/15/23 61  06/08/23 62    /10  In general this is a well appearing Caucasian man in no acute distress. He's alert and oriented x4 and appropriate throughout the examination. Cardiopulmonary assessment is negative for acute distress and he exhibits normal effort.     KPS = 100  100 - Normal; no complaints; no evidence of disease. 90   - Able to carry on normal activity; minor signs or symptoms of disease. 80   - Normal activity with effort; some signs or symptoms of disease. 33   - Cares for self; unable to carry on normal activity or to do active work. 60   - Requires occasional assistance, but is able to care for most of his personal needs. 50   - Requires considerable assistance and frequent medical care. 40   - Disabled; requires special care and assistance. 30   - Severely disabled; hospital admission is indicated although death not imminent. 20   - Very sick; hospital admission necessary; active supportive treatment necessary. 10   - Moribund; fatal processes progressing rapidly. 0     - Dead  Karnofsky DA, Abelmann WH, Craver LS and Burchenal [facility] [phone]) The use of the nitrogen mustards in the palliative treatment of carcinoma: with particular reference to bronchogenic carcinoma Cancer 1 634-56  LABORATORY DATA:  Lab Results  Component Value Date   WBC 6.4 05/09/2023   HGB 12.8 (L) 05/09/2023   HCT 38.6 (L) 05/09/2023   MCV 92.9 05/09/2023   PLT 194.0 05/09/2023   Lab Results  Component Value Date   NA 140 05/09/2023   K 4.0 05/09/2023   CL 104 05/09/2023   CO2 29 05/09/2023   Lab Results  Component Value  Date   ALT 28 05/09/2023   AST 25 05/09/2023   ALKPHOS 79 05/09/2023   BILITOT 0.9 05/09/2023     RADIOGRAPHY: No results found.    IMPRESSION/PLAN: 1. 73 y.o. man with locally recurrent prostate cancer within the prostatic fossa with a PSA of 7.6 s/p RALP in 2011  for pT3aN0, Gleason 4+3 adenocarcinoma of the prostate and salvage fossa XRT in 2015 for biochemical recurrence.  Today, we talked to the patient about the findings and workup thus far. We discussed the natural history of recurrent prostate cancer and general treatment, highlighting the role of radiotherapy in the management. We discussed the available radiation techniques, and focused on the details and logistics of delivery. The recommendation is for a 5 fraction course of stereotactic body radiotherapy (SBRT) to the PET positive disease in the prostatic fossa. We reviewed the anticipated acute and late sequelae associated with radiation in this setting, particularly in light of his prior salvage fossa radiation. The patient was encouraged to ask questions that were answered to his stated satisfaction.  At the end of our discussion, the patient is interested in proceeding with the recommended 5 fraction course of stereotactic body radiotherapy (SBRT) to the PET positive disease in the prostatic fossa.  He has freely signed written consent to proceed today in the office and a copy of this document will be placed in his medical record.  He is tentatively scheduled for CT simulation/treatment planning at 3 PM on Tuesday, 09/12/2023 so we will share our discussion with Dr. Nolon Baxter proceed with treatment planning accordingly, in anticipation of beginning his treatments in the near future.  We enjoyed meeting with him today and look forward to continuing to participate in his care.  He knows that he is welcome to call at anytime in the interim with any questions or concerns.  2. Postoperative stress urinary incontinence.  He reports having a return of bothersome stress urinary incontinence that is gradually progressing over the past 2 to 3 years.  He had great success with pelvic floor rehab postoperatively in 2011 and is interested in further evaluation/pelvic floor rehab.  We will make a referral to [facility] since it is much more convenient for him to have the physical therapy here in [place].  We personally spent 70 minutes in this encounter including chart review, reviewing radiological studies, meeting face-to-face with the patient, entering orders and completing documentation.    Arta Bihari, PA-C    Kenith Payer, MD  [facility]  Radiation Oncology Direct Dial: [phone]  Fax: [phone] [email or web address]  Skype  LinkedIn   This document serves as a record of services personally performed by Kenith Payer, MD and Keitha Pata, PA-C. It was created on their behalf by Florance Hun, a trained medical scribe. The creation of this record is based on the scribe's personal observations and the provider's statements to them. This document has been checked and approved by the attending provider.

## 2023-09-07 ENCOUNTER — Telehealth: Payer: Self-pay | Admitting: *Deleted

## 2023-09-07 NOTE — Telephone Encounter (Signed)
 CALLED PATIENT TO INFORM OF APPT. WITH [facility] (PT FOR BLADDER CONTROL AND PELVIC PAIN ON 09-13-23- ARRIVAL TIME- 7:45 AM WITH JANE BEDENBAUGH), PATIENT TO BRING ID AND INSURANCE CARD, LVM FOR A RETURN CALL

## 2023-09-08 ENCOUNTER — Telehealth: Payer: Self-pay | Admitting: *Deleted

## 2023-09-08 NOTE — Telephone Encounter (Signed)
 CALLED PATIENT TO INFORM OF PT APPT. BEING MOVED TO 12-04-23-ARRIVAL TIME- 2:45 @ [facility] SEEING Christian Ward, SPOKE WITH PATIENT AND HE IS AWARE OF THIS NEW DATE AND TIME

## 2023-09-12 ENCOUNTER — Ambulatory Visit
Admission: RE | Admit: 2023-09-12 | Discharge: 2023-09-12 | Disposition: A | Discharge status: Home / Self Care | Source: Ambulatory Visit | Attending: Radiation Oncology | Admitting: Radiation Oncology

## 2023-09-12 DIAGNOSIS — C61 Malignant neoplasm of prostate: Secondary | ICD-10-CM | POA: Diagnosis present

## 2023-09-12 DIAGNOSIS — Z510 Encounter for antineoplastic radiation therapy: Secondary | ICD-10-CM | POA: Insufficient documentation

## 2023-09-12 DIAGNOSIS — Z191 Hormone sensitive malignancy status: Secondary | ICD-10-CM | POA: Diagnosis not present

## 2023-09-14 ENCOUNTER — Other Ambulatory Visit: Payer: Self-pay | Admitting: Internal Medicine

## 2023-09-14 DIAGNOSIS — K279 Peptic ulcer, site unspecified, unspecified as acute or chronic, without hemorrhage or perforation: Secondary | ICD-10-CM

## 2023-09-14 DIAGNOSIS — K219 Gastro-esophageal reflux disease without esophagitis: Secondary | ICD-10-CM

## 2023-09-18 NOTE — Progress Notes (Signed)
  Radiation Oncology         (336) [phone] ________________________________  Name: Christian Ward MRN: [id number]  Date: 09/12/2023  DOB: 1950-11-02  STEREOTACTIC BODY RADIOTHERAPY SIMULATION AND TREATMENT PLANNING NOTE    ICD-10-CM   1. Prostate carcinoma, recurrent (HCC)  C61       DIAGNOSIS:  73 y/o man with locally recurrent prostate cancer within the prostatic fossa with a PSA of 7.6 s/p RALP in 2011 for pT3aN0, Gleason 4+3 adenocarcinoma of the prostate and salvage fossa XRT in 2015 for biochemical recurrence.  NARRATIVE:  The patient was brought to the CT Simulation planning suite.  Identity was confirmed.  All relevant records and images related to the planned course of therapy were reviewed.  The patient freely provided informed written consent to proceed with treatment after reviewing the details related to the planned course of therapy. The consent form was witnessed and verified by the simulation staff.  Then, the patient was set-up in a stable reproducible  supine position for radiation therapy.  A BodyFix immobilization pillow was fabricated for reproducible positioning.  Surface markings were placed.  The CT images were loaded into the planning software.  The gross target volumes (GTV) and planning target volumes (PTV) were delinieated, and avoidance structures were contoured.  Treatment planning then occurred.  The radiation prescription was entered and confirmed.  A total of two complex treatment devices were fabricated in the form of the BodyFix immobilization pillow and a neck accuform cushion.  I have requested : 3D Simulation  I have requested a DVH of the following structures: targets and all normal structures near the target including rectum, bladder, femoral heads, bowel and urethra as noted on the radiation plan to maintain doses in adherence with established limits  SPECIAL TREATMENT PROCEDURE:  The planned course of therapy using radiation constitutes a special treatment  procedure. Special care is required in the management of this patient for the following reasons. High dose per fraction requiring special monitoring for increased toxicities of treatment including daily imaging..  The special nature of the planned course of radiotherapy will require increased physician supervision and oversight to ensure patient's safety with optimal treatment outcomes.    This requires extended time and effort.    PLAN:  The patient will receive 36.25 Gy in 5 fractions.  ________________________________  Trilby Fujisawa Lorri Rota, M.D.

## 2023-09-20 DIAGNOSIS — M461 Sacroiliitis, not elsewhere classified: Secondary | ICD-10-CM | POA: Diagnosis not present

## 2023-09-21 DIAGNOSIS — Z191 Hormone sensitive malignancy status: Secondary | ICD-10-CM | POA: Diagnosis not present

## 2023-09-21 DIAGNOSIS — Z510 Encounter for antineoplastic radiation therapy: Secondary | ICD-10-CM | POA: Diagnosis not present

## 2023-09-25 ENCOUNTER — Other Ambulatory Visit: Payer: Self-pay

## 2023-09-25 ENCOUNTER — Ambulatory Visit
Admission: RE | Admit: 2023-09-25 | Discharge: 2023-09-25 | Disposition: A | Discharge status: Home / Self Care | Source: Ambulatory Visit | Attending: Radiation Oncology | Admitting: Radiation Oncology

## 2023-09-25 DIAGNOSIS — Z510 Encounter for antineoplastic radiation therapy: Secondary | ICD-10-CM | POA: Diagnosis not present

## 2023-09-25 LAB — RAD ONC ARIA SESSION SUMMARY
Course Elapsed Days: 0
Plan Fractions Treated to Date: 1
Plan Prescribed Dose Per Fraction: 7.25 Gy
Plan Total Fractions Prescribed: 5
Plan Total Prescribed Dose: 36.25 Gy
Reference Point Dosage Given to Date: 7.25 Gy
Reference Point Session Dosage Given: 7.25 Gy
Session Number: 1

## 2023-09-26 ENCOUNTER — Ambulatory Visit: Admitting: Radiation Oncology

## 2023-09-27 ENCOUNTER — Ambulatory Visit
Admission: RE | Admit: 2023-09-27 | Discharge: 2023-09-27 | Disposition: A | Discharge status: Home / Self Care | Source: Ambulatory Visit | Attending: Radiation Oncology | Admitting: Radiation Oncology

## 2023-09-27 ENCOUNTER — Other Ambulatory Visit: Payer: Self-pay

## 2023-09-27 DIAGNOSIS — Z510 Encounter for antineoplastic radiation therapy: Secondary | ICD-10-CM | POA: Diagnosis not present

## 2023-09-27 DIAGNOSIS — C61 Malignant neoplasm of prostate: Secondary | ICD-10-CM

## 2023-09-27 LAB — RAD ONC ARIA SESSION SUMMARY
Course Elapsed Days: 2
Plan Fractions Treated to Date: 2
Plan Prescribed Dose Per Fraction: 7.25 Gy
Plan Total Fractions Prescribed: 5
Plan Total Prescribed Dose: 36.25 Gy
Reference Point Dosage Given to Date: 14.5 Gy
Reference Point Session Dosage Given: 7.25 Gy
Session Number: 2

## 2023-09-28 ENCOUNTER — Ambulatory Visit: Admitting: Radiation Oncology

## 2023-09-29 ENCOUNTER — Other Ambulatory Visit: Payer: Self-pay

## 2023-09-29 ENCOUNTER — Ambulatory Visit
Admission: RE | Admit: 2023-09-29 | Discharge: 2023-09-29 | Disposition: A | Discharge status: Home / Self Care | Source: Ambulatory Visit | Attending: Radiation Oncology | Admitting: Radiation Oncology

## 2023-09-29 DIAGNOSIS — Z510 Encounter for antineoplastic radiation therapy: Secondary | ICD-10-CM | POA: Diagnosis not present

## 2023-09-29 DIAGNOSIS — C61 Malignant neoplasm of prostate: Secondary | ICD-10-CM

## 2023-09-29 LAB — RAD ONC ARIA SESSION SUMMARY
Course Elapsed Days: 4
Plan Fractions Treated to Date: 3
Plan Prescribed Dose Per Fraction: 7.25 Gy
Plan Total Fractions Prescribed: 5
Plan Total Prescribed Dose: 36.25 Gy
Reference Point Dosage Given to Date: 21.75 Gy
Reference Point Session Dosage Given: 7.25 Gy
Session Number: 3

## 2023-10-03 ENCOUNTER — Ambulatory Visit
Admission: RE | Admit: 2023-10-03 | Discharge: 2023-10-03 | Disposition: A | Discharge status: Home / Self Care | Source: Ambulatory Visit | Attending: Radiation Oncology | Admitting: Radiation Oncology

## 2023-10-03 ENCOUNTER — Other Ambulatory Visit: Payer: Self-pay

## 2023-10-03 DIAGNOSIS — Z510 Encounter for antineoplastic radiation therapy: Secondary | ICD-10-CM | POA: Diagnosis not present

## 2023-10-03 DIAGNOSIS — C61 Malignant neoplasm of prostate: Secondary | ICD-10-CM

## 2023-10-03 LAB — RAD ONC ARIA SESSION SUMMARY
Course Elapsed Days: 8
Plan Fractions Treated to Date: 4
Plan Prescribed Dose Per Fraction: 7.25 Gy
Plan Total Fractions Prescribed: 5
Plan Total Prescribed Dose: 36.25 Gy
Reference Point Dosage Given to Date: 29 Gy
Reference Point Session Dosage Given: 7.25 Gy
Session Number: 4

## 2023-10-05 ENCOUNTER — Ambulatory Visit
Admission: RE | Admit: 2023-10-05 | Discharge: 2023-10-05 | Disposition: A | Discharge status: Home / Self Care | Source: Ambulatory Visit | Attending: Radiation Oncology | Admitting: Radiation Oncology

## 2023-10-05 ENCOUNTER — Other Ambulatory Visit: Payer: Self-pay

## 2023-10-05 DIAGNOSIS — C61 Malignant neoplasm of prostate: Secondary | ICD-10-CM

## 2023-10-05 DIAGNOSIS — Z510 Encounter for antineoplastic radiation therapy: Secondary | ICD-10-CM | POA: Diagnosis not present

## 2023-10-05 DIAGNOSIS — Z191 Hormone sensitive malignancy status: Secondary | ICD-10-CM | POA: Diagnosis not present

## 2023-10-05 LAB — RAD ONC ARIA SESSION SUMMARY
Course Elapsed Days: 10
Plan Fractions Treated to Date: 5
Plan Prescribed Dose Per Fraction: 7.25 Gy
Plan Total Fractions Prescribed: 5
Plan Total Prescribed Dose: 36.25 Gy
Reference Point Dosage Given to Date: 36.25 Gy
Reference Point Session Dosage Given: 7.25 Gy
Session Number: 5

## 2023-10-06 ENCOUNTER — Other Ambulatory Visit: Payer: Self-pay

## 2023-10-06 ENCOUNTER — Telehealth: Payer: Self-pay | Admitting: Internal Medicine

## 2023-10-06 NOTE — Telephone Encounter (Signed)
 Patient came in and is needing His Vasculara and his clonazapam - clonazapam needs to [facility] sent to  [facility] and the [place] needs to be sent toi [employer].

## 2023-10-06 NOTE — Radiation Completion Notes (Addendum)
  Radiation Oncology         (336) [phone] ________________________________  Name: Christian Ward MRN: [id number]  Date: 10/05/2023  DOB: Jun 05, 1950  Referring Physician: Barbette Boom, M.D. Date of Service: 2023-10-06 Radiation Oncologist: Bartholome Ligas, M.D. [facility]     RADIATION ONCOLOGY END OF TREATMENT NOTE     Diagnosis: 73 y/o man with locally recurrent prostate cancer within the prostatic fossa with a PSA of 7.6 s/p RALP in 2011 for pT3aN0, Gleason 4+3 adenocarcinoma of the prostate and salvage fossa XRT in 2015 for biochemical recurrence.   Intent: Curative     ==========DELIVERED PLANS==========  First Treatment Date: 2023-09-25 Last Treatment Date: 2023-10-05   Plan Name: Prostate_SBRT Site: Prostate Technique: SBRT/SRT-IMRT Mode: Photon Dose Per Fraction: 7.25 Gy Prescribed Dose (Delivered / Prescribed): 36.25 Gy / 36.25 Gy Prescribed Fxs (Delivered / Prescribed): 5 / 5     ==========ON TREATMENT VISIT DATES========== 2023-09-25, 2023-09-27, 2023-09-29, 2023-10-03, 2023-10-05, 2023-10-05     See weekly On Treatment Notes in Epic for details in the Media tab (listed as Progress notes on the On Treatment Visit Dates listed above).  He tolerated the treatment very well with modest fatigue and constipation that was managed with MiraLAX as needed.   The patient will receive a call in about one month from the radiation oncology department. He will continue follow up with his urologist, Dr. Nolon Baxter, as well.  ------------------------------------------------   Kenith Payer, MD [facility]  Radiation Oncology Direct Dial: [phone]  Fax: [phone] [email or web address]  Skype  LinkedIn

## 2023-10-06 NOTE — Telephone Encounter (Signed)
 Unable to reach patient. LMTRC. Patient needs to schedule an appointment to see Dr. Rochelle Chu before he can receive any medication refills.

## 2023-10-07 ENCOUNTER — Other Ambulatory Visit: Payer: Self-pay | Admitting: Internal Medicine

## 2023-10-07 DIAGNOSIS — I872 Venous insufficiency (chronic) (peripheral): Secondary | ICD-10-CM

## 2023-10-07 DIAGNOSIS — F418 Other specified anxiety disorders: Secondary | ICD-10-CM

## 2023-10-07 DIAGNOSIS — F411 Generalized anxiety disorder: Secondary | ICD-10-CM

## 2023-10-07 MED ORDER — CLONAZEPAM 0.5 MG PO TABS: 0.5000 mg | ORAL_TABLET | Freq: Two times a day (BID) | ORAL | 0 refills | Status: DC | PRN

## 2023-10-07 MED ORDER — VASCULERA PO TABS: 1.0000 | ORAL_TABLET | Freq: Every day | ORAL | 11 refills | Status: AC

## 2023-10-17 ENCOUNTER — Other Ambulatory Visit: Payer: Self-pay

## 2023-10-17 MED ORDER — PROMETHAZINE HCL 25 MG PO TABS: 25.0000 mg | ORAL_TABLET | Freq: Four times a day (QID) | ORAL | 3 refills | Status: AC | PRN

## 2023-10-17 MED ORDER — ONDANSETRON HCL 4 MG PO TABS
4.0000 mg | ORAL_TABLET | Freq: Three times a day (TID) | ORAL | 3 refills | Status: AC | PRN
Start: 2023-10-17 — End: ?

## 2023-10-30 ENCOUNTER — Ambulatory Visit (INDEPENDENT_AMBULATORY_CARE_PROVIDER_SITE_OTHER): Admitting: Internal Medicine

## 2023-10-30 ENCOUNTER — Encounter: Payer: Self-pay | Admitting: Internal Medicine

## 2023-10-30 VITALS — BP 160/86 | HR 60 | Temp 98.2°F | Resp 16 | Ht 76.0 in | Wt 217.2 lb

## 2023-10-30 DIAGNOSIS — I10 Essential (primary) hypertension: Secondary | ICD-10-CM | POA: Diagnosis not present

## 2023-10-30 DIAGNOSIS — K5904 Chronic idiopathic constipation: Secondary | ICD-10-CM

## 2023-10-30 DIAGNOSIS — D638 Anemia in other chronic diseases classified elsewhere: Secondary | ICD-10-CM

## 2023-10-30 DIAGNOSIS — E785 Hyperlipidemia, unspecified: Secondary | ICD-10-CM | POA: Diagnosis not present

## 2023-10-30 DIAGNOSIS — C61 Malignant neoplasm of prostate: Secondary | ICD-10-CM | POA: Diagnosis not present

## 2023-10-30 DIAGNOSIS — F411 Generalized anxiety disorder: Secondary | ICD-10-CM

## 2023-10-30 LAB — HEPATIC FUNCTION PANEL
ALT: 22 U/L (ref 0–53)
AST: 23 U/L (ref 0–37)
Albumin: 4 g/dL (ref 3.5–5.2)
Alkaline Phosphatase: 94 U/L (ref 39–117)
Bilirubin, Direct: 0.2 mg/dL (ref 0.0–0.3)
Total Bilirubin: 0.9 mg/dL (ref 0.2–1.2)
Total Protein: 6.5 g/dL (ref 6.0–8.3)

## 2023-10-30 LAB — CBC WITH DIFFERENTIAL/PLATELET
Basophils Absolute: 0 10*3/uL (ref 0.0–0.1)
Basophils Relative: 0.4 % (ref 0.0–3.0)
Eosinophils Absolute: 0.1 10*3/uL (ref 0.0–0.7)
Eosinophils Relative: 1.9 % (ref 0.0–5.0)
HCT: 37.4 % — ABNORMAL LOW (ref 39.0–52.0)
Hemoglobin: 12.7 g/dL — ABNORMAL LOW (ref 13.0–17.0)
Lymphocytes Relative: 23.4 % (ref 12.0–46.0)
Lymphs Abs: 1.2 10*3/uL (ref 0.7–4.0)
MCHC: 33.9 g/dL (ref 30.0–36.0)
MCV: 90.5 fl (ref 78.0–100.0)
Monocytes Absolute: 0.4 10*3/uL (ref 0.1–1.0)
Monocytes Relative: 8.4 % (ref 3.0–12.0)
Neutro Abs: 3.5 10*3/uL (ref 1.4–7.7)
Neutrophils Relative %: 65.9 % (ref 43.0–77.0)
Platelets: 203 10*3/uL (ref 150.0–400.0)
RBC: 4.14 Mil/uL — ABNORMAL LOW (ref 4.22–5.81)
RDW: 13.6 % (ref 11.5–15.5)
WBC: 5.3 10*3/uL (ref 4.0–10.5)

## 2023-10-30 LAB — RETICULOCYTES
ABS Retic: 36990 {cells}/uL (ref 25000–90000)
Retic Ct Pct: 0.9 %

## 2023-10-30 LAB — TSH: TSH: 1.28 u[IU]/mL (ref 0.35–5.50)

## 2023-10-30 LAB — MAGNESIUM: Magnesium: 2 mg/dL (ref 1.5–2.5)

## 2023-10-30 MED ORDER — ROSUVASTATIN CALCIUM 5 MG PO TABS: 5.0000 mg | ORAL_TABLET | Freq: Every day | ORAL | 1 refills | Status: DC

## 2023-10-30 NOTE — Patient Instructions (Signed)
 Hypertension, Adult High blood pressure (hypertension) is when the force of blood pumping through the arteries is too strong. The arteries are the blood vessels that carry blood from the heart throughout the body. Hypertension forces the heart to work harder to pump blood and may cause arteries to become narrow or stiff. Untreated or uncontrolled hypertension can lead to a heart attack, heart failure, a stroke, kidney disease, and other problems. A blood pressure reading consists of a higher number over a lower number. Ideally, your blood pressure should be below 120/80. The first ("top") number is called the systolic pressure. It is a measure of the pressure in your arteries as your heart beats. The second ("bottom") number is called the diastolic pressure. It is a measure of the pressure in your arteries as the heart relaxes. What are the causes? The exact cause of this condition is not known. There are some conditions that result in high blood pressure. What increases the risk? Certain factors may make you more likely to develop high blood pressure. Some of these risk factors are under your control, including: Smoking. Not getting enough exercise or physical activity. Being overweight. Having too much fat, sugar, calories, or salt (sodium) in your diet. Drinking too much alcohol. Other risk factors include: Having a personal history of heart disease, diabetes, high cholesterol, or kidney disease. Stress. Having a family history of high blood pressure and high cholesterol. Having obstructive sleep apnea. Age. The risk increases with age. What are the signs or symptoms? High blood pressure may not cause symptoms. Very high blood pressure (hypertensive crisis) may cause: Headache. Fast or irregular heartbeats (palpitations). Shortness of breath. Nosebleed. Nausea and vomiting. Vision changes. Severe chest pain, dizziness, and seizures. How is this diagnosed? This condition is diagnosed by  measuring your blood pressure while you are seated, with your arm resting on a flat surface, your legs uncrossed, and your feet flat on the floor. The cuff of the blood pressure monitor will be placed directly against the skin of your upper arm at the level of your heart. Blood pressure should be measured at least twice using the same arm. Certain conditions can cause a difference in blood pressure between your right and left arms. If you have a high blood pressure reading during one visit or you have normal blood pressure with other risk factors, you may be asked to: Return on a different day to have your blood pressure checked again. Monitor your blood pressure at home for 1 week or longer. If you are diagnosed with hypertension, you may have other blood or imaging tests to help your health care provider understand your overall risk for other conditions. How is this treated? This condition is treated by making healthy lifestyle changes, such as eating healthy foods, exercising more, and reducing your alcohol intake. You may be referred for counseling on a healthy diet and physical activity. Your health care provider may prescribe medicine if lifestyle changes are not enough to get your blood pressure under control and if: Your systolic blood pressure is above 130. Your diastolic blood pressure is above 80. Your personal target blood pressure may vary depending on your medical conditions, your age, and other factors. Follow these instructions at home: Eating and drinking  Eat a diet that is high in fiber and potassium, and low in sodium, added sugar, and fat. An example of this eating plan is called the DASH diet. DASH stands for Dietary Approaches to Stop Hypertension. To eat this way: Eat  plenty of fresh fruits and vegetables. Try to fill one half of your plate at each meal with fruits and vegetables. Eat whole grains, such as whole-wheat pasta, brown rice, or whole-grain bread. Fill about one  fourth of your plate with whole grains. Eat or drink low-fat dairy products, such as skim milk or low-fat yogurt. Avoid fatty cuts of meat, processed or cured meats, and poultry with skin. Fill about one fourth of your plate with lean proteins, such as fish, chicken without skin, beans, eggs, or tofu. Avoid pre-made and processed foods. These tend to be higher in sodium, added sugar, and fat. Reduce your daily sodium intake. Many people with hypertension should eat less than 1,500 mg of sodium a day. Do not drink alcohol if: Your health care provider tells you not to drink. You are pregnant, may be pregnant, or are planning to become pregnant. If you drink alcohol: Limit how much you have to: 0-1 drink a day for women. 0-2 drinks a day for men. Know how much alcohol is in your drink. In the [place] one drink equals one 12 oz bottle of beer (355 mL), one 5 oz glass of wine (148 mL), or one 1 oz glass of hard liquor (44 mL). Lifestyle  Work with your health care provider to maintain a healthy body weight or to lose weight. Ask what an ideal weight is for you. Get at least 30 minutes of exercise that causes your heart to beat faster (aerobic exercise) most days of the week. Activities may include walking, swimming, or biking. Include exercise to strengthen your muscles (resistance exercise), such as Pilates or lifting weights, as part of your weekly exercise routine. Try to do these types of exercises for 30 minutes at least 3 days a week. Do not use any products that contain nicotine or tobacco. These products include cigarettes, chewing tobacco, and vaping devices, such as e-cigarettes. If you need help quitting, ask your health care provider. Monitor your blood pressure at home as told by your health care provider. Keep all follow-up visits. This is important. Medicines Take over-the-counter and prescription medicines only as told by your health care provider. Follow directions carefully. Blood  pressure medicines must be taken as prescribed. Do not skip doses of blood pressure medicine. Doing this puts you at risk for problems and can make the medicine less effective. Ask your health care provider about side effects or reactions to medicines that you should watch for. Contact a health care provider if you: Think you are having a reaction to a medicine you are taking. Have headaches that keep coming back (recurring). Feel dizzy. Have swelling in your ankles. Have trouble with your vision. Get help right away if you: Develop a severe headache or confusion. Have unusual weakness or numbness. Feel faint. Have severe pain in your chest or abdomen. Vomit repeatedly. Have trouble breathing. These symptoms may be an emergency. Get help right away. Call 911. Do not wait to see if the symptoms will go away. Do not drive yourself to the hospital. Summary Hypertension is when the force of blood pumping through your arteries is too strong. If this condition is not controlled, it may put you at risk for serious complications. Your personal target blood pressure may vary depending on your medical conditions, your age, and other factors. For most people, a normal blood pressure is less than 120/80. Hypertension is treated with lifestyle changes, medicines, or a combination of both. Lifestyle changes include losing weight, eating a healthy,  low-sodium diet, exercising more, and limiting alcohol. This information is not intended to replace advice given to you by your health care provider. Make sure you discuss any questions you have with your health care provider. Document Revised: 03/02/2021 Document Reviewed: 03/02/2021 Elsevier Patient Education  2024 [employer].

## 2023-10-30 NOTE — Progress Notes (Unsigned)
 Subjective:  Patient ID: Christian Ward, male    DOB: 1950/08/03  Age: 73 y.o. MRN: [id number]  CC: Hypertension   HPI Christian Ward presents for f/up ----  Discussed the use of AI scribe software for clinical note transcription with the patient, who gave verbal consent to proceed.  History of Present Illness   Christian Ward is a 73 year old male with prostate cancer who presents for a follow-up regarding his recent radiation treatment and medication refill.  He has a history of prostate cancer diagnosed in 2008, leading to a prostatectomy in 2011 due to a rising PSA. In 2015, his PSA began to rise again, prompting eight weeks of radiation therapy, which successfully reduced his PSA to zero. However, last summer, his PSA levels began to increase once more, reaching over six by January. A recent PET scan identified a small hotspot under his bladder, leading to five sessions of targeted radiation therapy a month ago.  He experiences chronic back pain, which he manages with daily stretching exercises. He also has sinus issues, using Flonase  daily and occasionally Afrin for severe cases. He has been evaluated by an ENT and allergy  specialist, with tests showing no allergies. He was informed that his sinus sensitivity may increase with age.  He has a history of high blood pressure, typically around 135-140 mmHg systolic, but it was elevated to 150/80 mmHg during today's visit. He previously used blood pressure medication during a stressful period but discontinued it after his wife's passing.  He experiences constipation, using Miralax once a week. Following his recent radiation treatment, he had loose stools and increased urination for about three weeks, which have since normalized.       Outpatient Medications Prior to Visit  Medication Sig Dispense Refill   acetaminophen  (TYLENOL ) 500 MG tablet Take 500 mg by mouth every 6 (six) hours as needed for mild pain.     CELEBREX 200 MG  capsule Take 200 mg by mouth daily.     Cholecalciferol (VITAMIN D3 PO) Take 2,000 Units by mouth daily.     clonazePAM  (KLONOPIN ) 0.5 MG tablet Take 1 tablet (0.5 mg total) by mouth 2 (two) times daily as needed for anxiety. 60 tablet 0   Dietary Management Product (VASCULERA) TABS Take 1 tablet by mouth daily. 30 tablet 11   fluticasone  (FLONASE ) 50 MCG/ACT nasal spray Place 2 sprays into both nostrils daily. 16 g 5   ipratropium (ATROVENT ) 0.06 % nasal spray Place 2 sprays into both nostrils 4 (four) times daily as needed for rhinitis. 15 mL 5   Multiple Vitamin (MULTIVITAMIN) tablet Take 1 tablet by mouth daily.     omeprazole  (PRILOSEC) 20 MG capsule Take 1 capsule (20 mg total) by mouth 2 (two) times daily before a meal. Schedule an appt for further refills 60 capsule 0   ondansetron  (ZOFRAN ) 4 MG tablet Take 1 tablet (4 mg total) by mouth every 8 (eight) hours as needed for nausea or vomiting. 20 tablet 3   polyethylene glycol (MIRALAX / GLYCOLAX) 17 g packet Take 17 g by mouth daily as needed for mild constipation.     pregabalin (LYRICA) 50 MG capsule Take 50 mg by mouth as needed.     Probiotic Product (PROBIOTIC DAILY) CAPS Take 1 capsule by mouth daily.     promethazine  (PHENERGAN ) 25 MG tablet Take 1 tablet (25 mg total) by mouth every 6 (six) hours as needed for nausea or vomiting. 30 tablet 3  rosuvastatin  (CRESTOR ) 5 MG tablet Take 1 tablet (5 mg total) by mouth daily. (Patient not taking: Reported on 10/30/2023) 90 tablet 1   No facility-administered medications prior to visit.    ROS Review of Systems  Objective:  BP (!) 160/86 (BP Location: Left Arm, Patient Position: Sitting, Cuff Size: Large)   Pulse 60   Temp 98.2 F (36.8 C) (Oral)   Resp 16   Ht 6' 4 (1.93 m)   Wt 217 lb 4 oz (98.5 kg)   SpO2 97%   BMI 26.44 kg/m   BP Readings from Last 3 Encounters:  10/30/23 (!) 160/86  09/05/23 (!) 158/74  08/15/23 134/74    Wt Readings from Last 3 Encounters:   10/30/23 217 lb 4 oz (98.5 kg)  09/05/23 221 lb 3.2 oz (100.3 kg)  08/15/23 215 lb (97.5 kg)    Physical Exam Vitals reviewed.   Cardiovascular:     Rate and Rhythm: Normal rate and regular rhythm.     Heart sounds: Normal heart sounds, S1 normal and S2 normal. No murmur heard.    Comments: EKG--- NSR, 60 bpm Minimal LVH - new No Q waves or ST/T wave changes  Musculoskeletal:     Right lower leg: No edema.     Left lower leg: No edema.     Lab Results  Component Value Date   WBC 6.4 05/09/2023   HGB 12.8 (L) 05/09/2023   HCT 38.6 (L) 05/09/2023   PLT 194.0 05/09/2023   GLUCOSE 95 05/09/2023   CHOL 163 05/09/2023   TRIG 85.0 05/09/2023   HDL 42.80 05/09/2023   LDLCALC 103 (H) 05/09/2023   ALT 28 05/09/2023   AST 25 05/09/2023   NA 140 05/09/2023   K 4.0 05/09/2023   CL 104 05/09/2023   CREATININE 0.98 05/09/2023   BUN 23 05/09/2023   CO2 29 05/09/2023   TSH 0.93 10/20/2022   PSA 1.75 05/12/2022   INR 1.06 08/31/2016   HGBA1C 5.8 03/09/2021    No results found.  Assessment & Plan:  Anemia, chronic disease -     CBC with Differential/Platelet; Future -     Reticulocytes; Future  Prostate carcinoma, recurrent (HCC)  Essential hypertension -     Basic metabolic panel with GFR; Future -     Hepatic function panel; Future -     EKG 12-Lead -     Urinalysis, Routine w reflex microscopic; Future  Chronic idiopathic constipation -     Basic metabolic panel with GFR; Future -     TSH; Future -     Magnesium; Future  Dyslipidemia, goal LDL below 100 -     TSH; Future -     Hepatic function panel; Future -     Rosuvastatin  Calcium ; Take 1 tablet (5 mg total) by mouth daily.  Dispense: 90 tablet; Refill: 1     Follow-up: Return in about 6 months (around 04/30/2024).  Debby Molt, MD

## 2023-10-31 ENCOUNTER — Ambulatory Visit: Payer: Self-pay | Admitting: Internal Medicine

## 2023-10-31 ENCOUNTER — Ambulatory Visit
Admission: RE | Admit: 2023-10-31 | Discharge: 2023-10-31 | Disposition: A | Discharge status: Home / Self Care | Source: Ambulatory Visit | Attending: Internal Medicine | Admitting: Internal Medicine

## 2023-10-31 LAB — URINALYSIS, ROUTINE W REFLEX MICROSCOPIC
Bilirubin Urine: NEGATIVE
Hgb urine dipstick: NEGATIVE
Ketones, ur: NEGATIVE
Leukocytes,Ua: NEGATIVE
Nitrite: NEGATIVE
RBC / HPF: NONE SEEN (ref 0–?)
Specific Gravity, Urine: 1.02 (ref 1.000–1.030)
Total Protein, Urine: NEGATIVE
Urine Glucose: NEGATIVE
Urobilinogen, UA: 1 (ref 0.0–1.0)
pH: 6 (ref 5.0–8.0)

## 2023-10-31 LAB — BASIC METABOLIC PANEL WITH GFR
BUN: 24 mg/dL — ABNORMAL HIGH (ref 6–23)
CO2: 27 meq/L (ref 19–32)
Calcium: 9.1 mg/dL (ref 8.4–10.5)
Chloride: 103 meq/L (ref 96–112)
Creatinine, Ser: 1 mg/dL (ref 0.40–1.50)
GFR: 75.3 mL/min (ref >60.00)
Glucose, Bld: 103 mg/dL — ABNORMAL HIGH (ref 70–99)
Potassium: 4.1 meq/L (ref 3.5–5.1)
Sodium: 139 meq/L (ref 135–145)

## 2023-10-31 MED ORDER — CLONAZEPAM 0.5 MG PO TABS: 0.5000 mg | ORAL_TABLET | Freq: Two times a day (BID) | ORAL | 0 refills | Status: AC | PRN

## 2023-10-31 MED ORDER — OLMESARTAN MEDOXOMIL 20 MG PO TABS: 20.0000 mg | ORAL_TABLET | Freq: Every day | ORAL | 0 refills | Status: DC

## 2023-10-31 NOTE — Progress Notes (Signed)
  Radiation Oncology         [phone]) [phone] ________________________________  Name: [name] MRN: [id number]  Date of Service: 10/31/2023  DOB: Sep 19, 1950  Post Treatment Telephone Note  Diagnosis: 73 y/o man with locally recurrent prostate cancer within the prostatic fossa with a PSA of 7.6 s/p RALP in 2011 for pT3aN0, Gleason 4+3 adenocarcinoma of the prostate and salvage fossa XRT in 2015 for biochemical recurrence.   Pre Treatment IPSS Score: 13 (as documented in the provider consult note)  The patient was available for call today.   Symptoms of fatigue have improved since completing therapy.  Symptoms of bladder changes have improved since completing therapy. Current symptoms include nocturia x4+, occasional dysuria 2/10, and medications for bladder symptoms include none.  Symptoms of bowel changes have improved since completing therapy. Current symptoms include none, and medications for bowel symptoms include half dose of Miralax to help control constipation.  Post Treatment IPSS Score: IPSS Questionnaire (AUA-7): Over the past month.   1)  How often have you had a sensation of not emptying your bladder completely after you finish urinating?  0 - Not at all  2)  How often have you had to urinate again less than two hours after you finished urinating? 3 - About half the time  3)  How often have you found you stopped and started again several times when you urinated?  0 - Not at all  4) How difficult have you found it to postpone urination?  1 - Less than 1 time in 5  5) How often have you had a weak urinary stream?  1 - Less than 1 time in 5  6) How often have you had to push or strain to begin urination?  1 - Less than 1 time in 5  7) How many times did you most typically get up to urinate from the time you went to bed until the time you got up in the morning?  4 - 4 times  Total score:  10. Which indicates moderate symptoms  0-7 mildly symptomatic   8-19 moderately  symptomatic   20-35 severely symptomatic   Patient has a scheduled follow up visit with his urologist, Dr. Tanda Moats, on 12/2023 for ongoing surveillance. He was counseled that PSA levels will be drawn in the urology office, and was reassured that additional time is expected to improve bowel and bladder symptoms. He was encouraged to call back with concerns or questions regarding radiation.   This concludes the interaction.  Rosaline Minerva, LPN

## 2023-11-06 DIAGNOSIS — M6289 Other specified disorders of muscle: Secondary | ICD-10-CM | POA: Diagnosis not present

## 2023-11-06 DIAGNOSIS — M6281 Muscle weakness (generalized): Secondary | ICD-10-CM | POA: Diagnosis not present

## 2023-11-08 ENCOUNTER — Telehealth: Payer: Self-pay | Admitting: *Deleted

## 2023-11-08 NOTE — Progress Notes (Signed)
 Care Guide Pharmacy Note  11/08/2023 Name: Christian Ward MRN: [id number] DOB: 09/04/1950  Referred By: [name], MD Reason for referral: Complex Care Management (Outreach to schedule referral with pharmacist ) and Call Attempt #1   Christian Ward is a 73 y.o. year old male who is a primary care patient of [name], MD.  Christian Ward was referred to the pharmacist for assistance related to: HTN  An unsuccessful telephone outreach was attempted today to contact the patient who was referred to the pharmacy team for assistance with medication management. Additional attempts will be made to contact the patient.  Christian Ward, CMA [place]  [facility], [facility] Guide Direct Dial: [phone]  Fax: [phone] Website: [email or web address]

## 2023-11-09 NOTE — Progress Notes (Signed)
 Care Guide Pharmacy Note  11/09/2023 Name: Christian Ward MRN: [id number] DOB: Jul 13, 1950  Referred By: [name], MD Reason for referral: Complex Care Management (Outreach to schedule referral with pharmacist ) and Call Attempt #1   Christian Ward is a 73 y.o. year old male who is a primary care patient of [name], MD.  [name] was referred to the pharmacist for assistance related to: HTN  A second unsuccessful telephone outreach was attempted today to contact the patient who was referred to the pharmacy team for assistance with medication management. Additional attempts will be made to contact the patient.  [name], CMA [place]  [facility], [facility] Guide Direct Dial: [phone]  Fax: [phone] Website: [email or web address]

## 2023-11-13 NOTE — Progress Notes (Signed)
 Care Guide Pharmacy Note  11/13/2023 Name: Christian Ward MRN: [id number] DOB: 01-Nov-1950  Referred By: [name], MD Reason for referral: Complex Care Management (Outreach to schedule referral with pharmacist ) and Call Attempt #1   Christian Ward is a 73 y.o. year old male who is a primary care patient of [name], MD.  Christian Ward was referred to the pharmacist for assistance related to: HTN  A third unsuccessful telephone outreach was attempted today to contact the patient who was referred to the pharmacy team for assistance with medication management. The Population Health team is pleased to engage with this patient at any time in the future upon receipt of referral and should he/she be interested in assistance from the Population Health team.  [name], CMA [facility]  [facility], [facility] Guide Direct Dial: [phone]  Fax: [phone] Website: [email or web address]

## 2023-11-29 ENCOUNTER — Other Ambulatory Visit: Payer: Self-pay | Admitting: Internal Medicine

## 2023-12-11 ENCOUNTER — Other Ambulatory Visit: Payer: Self-pay | Admitting: Internal Medicine

## 2023-12-11 DIAGNOSIS — K279 Peptic ulcer, site unspecified, unspecified as acute or chronic, without hemorrhage or perforation: Secondary | ICD-10-CM

## 2023-12-11 DIAGNOSIS — K219 Gastro-esophageal reflux disease without esophagitis: Secondary | ICD-10-CM

## 2023-12-12 NOTE — Telephone Encounter (Signed)
 Patient would like this RX sent in since he was just here in June.  Patient scheduled 6 month follow up in December.

## 2023-12-13 NOTE — Telephone Encounter (Signed)
 Last OV 10/30/23 Next OV 04/22/24  Last refill 08/07/23 Qty #60/0

## 2023-12-21 DIAGNOSIS — M461 Sacroiliitis, not elsewhere classified: Secondary | ICD-10-CM | POA: Diagnosis not present

## 2024-01-11 LAB — PSA: PSA: 2.6

## 2024-01-29 DIAGNOSIS — R350 Frequency of micturition: Secondary | ICD-10-CM | POA: Diagnosis not present

## 2024-02-09 ENCOUNTER — Ambulatory Visit
Admission: RE | Admit: 2024-02-09 | Discharge: 2024-02-09 | Disposition: A | Discharge status: Home / Self Care | Source: Ambulatory Visit | Attending: Radiation Oncology | Admitting: Radiation Oncology

## 2024-02-09 VITALS — BP 158/84 | HR 66 | Temp 97.9°F | Resp 18 | Ht 76.0 in | Wt 220.2 lb

## 2024-02-09 DIAGNOSIS — C61 Malignant neoplasm of prostate: Secondary | ICD-10-CM

## 2024-02-09 DIAGNOSIS — R300 Dysuria: Secondary | ICD-10-CM | POA: Insufficient documentation

## 2024-02-09 NOTE — Progress Notes (Signed)
 Radiation Oncology         [phone] ________________________________  Name: Christian Ward MRN: [id number]  Date: 02/09/2024  DOB: 1950/08/18  Follow-Up Visit Note  CC: Christian Debby CROME, MD  Christian Debby CROME, MD  Diagnosis:   73 yo man with locally recurrent prostate cancer within the prostatic fossa with a PSA of 7.6 s/p RALP in 2011 for pT3aN0, Gleason 4+3 adenocarcinoma of the prostate and salvage fossa XRT in 2015 for biochemical recurrence.    ICD-10-CM   1. Dysuria  R30.0 Urine culture    CANCELED: Urinalysis, Complete w Microscopic    2. Prostate carcinoma, recurrent (HCC)  C61       Interval Since Last Radiation:  5 months  Narrative:  The patient returns today for routine follow-up.  He was doing well in his recovery from salvage SBRT.  Following completion of SBRT, he had some minor issues with urinary incontinence, manageable with thin pads for a few weeks.  Then, for the next 3-4 months, he has not had any trouble with incontinence.  He saw Dr. Nicholaus for follow-up on 01/11/24 and PSA was trending down from 7.6 to 2.67.  Suddenly 3-4 weeks ago, he developed more severe urinary incontinence, and describes going through 3 full sized depends per day.  He has minor dysuria. He was evaluated with U/A, and PVR which showed no signs of UTI and no urinary retention.  He asked to be seen to today to review his recent experience.  ALLERGIES:  is allergic to tramadol and oxycodone .  Meds: Current Outpatient Medications  Medication Sig Dispense Refill   acetaminophen  (TYLENOL ) 500 MG tablet Take 500 mg by mouth every 6 (six) hours as needed for mild pain.     CELEBREX 200 MG capsule Take 200 mg by mouth daily.     Cholecalciferol (VITAMIN D3 PO) Take 2,000 Units by mouth daily.     clonazePAM  (KLONOPIN ) 0.5 MG tablet Take 1 tablet (0.5 mg total) by mouth 2 (two) times daily as needed for anxiety. 180 tablet 0   Dietary Management Product (VASCULERA) TABS Take 1 tablet by mouth  daily. 30 tablet 11   fluticasone  (FLONASE ) 50 MCG/ACT nasal spray SPRAY 2 SPRAYS INTO EACH NOSTRIL EVERY DAY 16 mL 5   ipratropium (ATROVENT ) 0.06 % nasal spray Place 2 sprays into both nostrils 4 (four) times daily as needed for rhinitis. 15 mL 5   Multiple Vitamin (MULTIVITAMIN) tablet Take 1 tablet by mouth daily.     olmesartan  (BENICAR ) 20 MG tablet Take 1 tablet (20 mg total) by mouth daily. 90 tablet 0   omeprazole  (PRILOSEC) 20 MG capsule TAKE 1 CAPSULE BY MOUTH 2 TIMES DAILY BEFORE A MEAL. SCHEDULE AN APPT FOR FURTHER REFILLS 60 capsule 3   ondansetron  (ZOFRAN ) 4 MG tablet Take 1 tablet (4 mg total) by mouth every 8 (eight) hours as needed for nausea or vomiting. 20 tablet 3   polyethylene glycol (MIRALAX / GLYCOLAX) 17 g packet Take 17 g by mouth daily as needed for mild constipation.     pregabalin (LYRICA) 50 MG capsule Take 50 mg by mouth as needed.     Probiotic Product (PROBIOTIC DAILY) CAPS Take 1 capsule by mouth daily.     promethazine  (PHENERGAN ) 25 MG tablet Take 1 tablet (25 mg total) by mouth every 6 (six) hours as needed for nausea or vomiting. 30 tablet 3   rosuvastatin  (CRESTOR ) 5 MG tablet Take 1 tablet (5 mg total) by mouth daily.  90 tablet 1   No current facility-administered medications for this encounter.    Physical Findings: The patient is in no acute distress. Patient is alert and oriented.  height is 6' 4 (1.93 m) and weight is 220 lb 3.2 oz (99.9 kg). His temperature is 97.9 F (36.6 C). His blood pressure is 158/84 (abnormal) and his pulse is 66. His respiration is 18 and oxygen saturation is 99%. .  No significant changes.  Lab Findings: Lab Results  Component Value Date   WBC 5.3 10/30/2023   HGB 12.7 (L) 10/30/2023   HCT 37.4 (L) 10/30/2023   PLT 203.0 10/30/2023    Lab Results  Component Value Date   NA 139 10/30/2023   NA 141 07/12/2016   K 4.1 10/30/2023   CO2 27 10/30/2023   GLUCOSE 103 (H) 10/30/2023   BUN 24 (H) 10/30/2023   BUN 24  (A) 07/12/2016   CREATININE 1.00 10/30/2023   BILITOT 0.9 10/30/2023   ALKPHOS 94 10/30/2023   AST 23 10/30/2023   ALT 22 10/30/2023   PROT 6.5 10/30/2023   ALBUMIN 4.0 10/30/2023   CALCIUM  9.1 10/30/2023   ANIONGAP 8 12/18/2016    Radiographic Findings:  01/29/24- PVR revealed Residual Urine of 0 mL.  Impression:  The patient is recovering from the effects of radiation with an encouraging drop in PSA.  However, he has sudden worsening of incontinence 5 months after SBRT.    Plan:  Today, I talked to the patient about his recent experience.  I explained that it is uncommon to experience urinary incontinence as a result of radiation therapy.  However, his clinical presentation of local recurrence in the pelvic floor is also an unusual scenario.  The patient has not been performing pelvic floor exercises in recent years, but he did perform pelvic floor exercises after his prostatectomy and is familiar.  He is planning to work on pelvic floor exercises.  I also encouraged him to consider a trial of Azo for urinary irritation.  I am hopeful that with strengthening of the pelvic floor, the patient may regain control of the bladder.  We also talked today about a possible antispasmodic to reduce bladder muscle spasms, and I would defer to Dr. Nicholaus about this option.  I am hopeful that with conservative measures including physical therapy and possible medication changes that we can resolve his incontinence.  If conservative measures fail, we also discussed the possibility of an artificial urinary sphincter if he is a candidate.  The patient is mostly pleased with his experience receiving SBRT, due to his current PSA decline.  However, he is hoping to optimize bladder control in order to regain his excellent quality of life.  At this point, we will follow-up with the patient as needed.  _____________________________________  Christian Ward, M.D.

## 2024-02-09 NOTE — Progress Notes (Signed)
 Nursing interview for a diagnosis:  73 y/o man with locally recurrent prostate cancer within the prostatic fossa with a PSA of 7.6 s/p RALP in 2011 for pT3aN0, Gleason 4+3 adenocarcinoma of the prostate and salvage fossa XRT in 2015 for biochemical recurrence. Completed SBRT/prostate on 10/05/2023.  Patient identity verified x2.   Patient reports:  Dysuria slight & urinary incontinence.  Denies hematuria. Normal stools. Reports had symptoms shortly after treatment that had subsided after 3 months than few weeks later it hit him hard. PSA lowered since radiation. Patient denies all other related issues at this time.  Meaningful use complete.  Had bladder scan done no issues with emptying bladder on 01/29/2024.  Urinalysis negative results.    Urinary Management medication(s) No Urology appointment date- next appointment with Dr. Nicholaus in 3 months.

## 2024-02-11 LAB — URINE CULTURE: Culture: 70000 — AB

## 2024-02-12 ENCOUNTER — Other Ambulatory Visit: Payer: Self-pay | Admitting: Radiation Oncology

## 2024-02-12 ENCOUNTER — Ambulatory Visit: Payer: Self-pay | Admitting: Radiation Oncology

## 2024-02-12 MED ORDER — NITROFURANTOIN MONOHYD MACRO 100 MG PO CAPS: 100.0000 mg | ORAL_CAPSULE | Freq: Two times a day (BID) | ORAL | 0 refills | Status: AC

## 2024-02-12 NOTE — Progress Notes (Signed)
 Radiation Oncology         [phone] ________________________________  Name: Christian Ward MRN: [id number]  Date: 02/12/2024  DOB: [birth year]  Lab Result Note  CC: [name], MD  No ref. provider found  Diagnosis:   73 yo man with locally recurrent prostate cancer within the prostatic fossa with a PSA of 7.6 s/p RALP in 2011 for pT3aN0, Gleason 4+3 adenocarcinoma of the prostate and salvage fossa XRT in 2015 for biochemical recurrence.  Narrative:  The patient recently returned for routine follow-up with new onset urinary incontinence.   Urine culture was performed.  This showed E. Coli infection  ALLERGIES:  is allergic to tramadol and oxycodone .  Meds: Current Outpatient Medications  Medication Sig Dispense Refill   nitrofurantoin, macrocrystal-monohydrate, (MACROBID) 100 MG capsule Take 1 capsule (100 mg total) by mouth 2 (two) times daily for 10 days. 20 capsule 0   acetaminophen  (TYLENOL ) 500 MG tablet Take 500 mg by mouth every 6 (six) hours as needed for mild pain.     CELEBREX 200 MG capsule Take 200 mg by mouth daily.     Cholecalciferol (VITAMIN D3 PO) Take 2,000 Units by mouth daily.     clonazePAM  (KLONOPIN ) 0.5 MG tablet Take 1 tablet (0.5 mg total) by mouth 2 (two) times daily as needed for anxiety. 180 tablet 0   Dietary Management Product (VASCULERA) TABS Take 1 tablet by mouth daily. 30 tablet 11   fluticasone  (FLONASE ) 50 MCG/ACT nasal spray SPRAY 2 SPRAYS INTO EACH NOSTRIL EVERY DAY 16 mL 5   ipratropium (ATROVENT ) 0.06 % nasal spray Place 2 sprays into both nostrils 4 (four) times daily as needed for rhinitis. 15 mL 5   Multiple Vitamin (MULTIVITAMIN) tablet Take 1 tablet by mouth daily.     olmesartan  (BENICAR ) 20 MG tablet Take 1 tablet (20 mg total) by mouth daily. 90 tablet 0   omeprazole  (PRILOSEC) 20 MG capsule TAKE 1 CAPSULE BY MOUTH 2 TIMES DAILY BEFORE A MEAL. SCHEDULE AN APPT FOR FURTHER REFILLS 60 capsule 3   ondansetron  (ZOFRAN ) 4 MG tablet  Take 1 tablet (4 mg total) by mouth every 8 (eight) hours as needed for nausea or vomiting. 20 tablet 3   polyethylene glycol (MIRALAX / GLYCOLAX) 17 g packet Take 17 g by mouth daily as needed for mild constipation.     pregabalin (LYRICA) 50 MG capsule Take 50 mg by mouth as needed.     Probiotic Product (PROBIOTIC DAILY) CAPS Take 1 capsule by mouth daily.     promethazine  (PHENERGAN ) 25 MG tablet Take 1 tablet (25 mg total) by mouth every 6 (six) hours as needed for nausea or vomiting. 30 tablet 3   rosuvastatin  (CRESTOR ) 5 MG tablet Take 1 tablet (5 mg total) by mouth daily. 90 tablet 1   No current facility-administered medications for this visit.   Lab Findings: Status: Final result     Next appt: 02/14/2024 at 02:20 PM in Otolaryngology (Su LELON Moccasin, MD)     Dx: Dysuria   Test Result Released: No (inaccessible in MyChart)   Specimen Information: Urine, Clean Catch  0 Result Notes    Component Ref Range & Units (hover) 3 d ago  Specimen Description URINE, CLEAN CATCH Performed at [facility], [address] [place], [place] [address]  Special Requests NONE Performed at [facility], [address] [place], [place] [address]  Culture 70,000 COLONIES/mL ESCHERICHIA COLI Abnormal   Report  Status 02/11/2024 FINAL  Organism ID, Bacteria ESCHERICHIA COLI Abnormal   Resulting Agency [facility]     Susceptibility   Escherichia coli    MIC    AMPICILLIN >=32 RESIST... Resistant    AMPICILLIN/SULBACTAM >=32 RESIST... Resistant    CEFAZOLIN  (URINE)  Sensitive 1    CEFEPIME <=0.12 SENS... Sensitive    CEFTRIAXONE  <=0.25 SENS... Sensitive    CIPROFLOXACIN >=4 RESISTANT Resistant    ERTAPENEM <=0.12 SENS... Sensitive    GENTAMICIN <=1 SENSITIVE Sensitive    MEROPENEM <=0.25 SENS... Sensitive    NITROFURANTOIN <=16 SENSIT... Sensitive    PIP/TAZO  Intermediate 2    TRIMETH/SULFA >=320 RESIS... Resistant           1  (Escherichia coli/CEFAZOLIN  (URINE))   8 SENSITIVE This is a modified FDA-approved test that has been validated and its performance characteristics determined by the reporting laboratory.  This laboratory is certified under the Clinical Laboratory Improvement Amendments CLIA as qualified to perform high complexity clinical laboratory testing.  2 (Escherichia coli/PIP/TAZO)   64 INTERMEDIATE This is a modified FDA-approved test that has been validated and its performance characteristics determined by the reporting laboratory.  This laboratory is certified under the Clinical Laboratory Improvement Amendments CLIA as qualified to perform high complexity clinical laboratory testing.     Susceptibility Comments  Escherichia coli  70,000 COLONIES/mL ESCHERICHIA COLI     Specimen Collected: 02/09/24 15:18 Last Resulted: 02/11/24 08:16    Impression:  The patient has new onset urinary incontinence and now has an E. Coli UTI.  I called the patient and got his voicemail.   Plan:  I sent Macrobid twice daily for 10 days to his pharmacy [facility] - [address]  _____________________________________  Donnice LABOR. Patrcia, M.D.

## 2024-02-14 ENCOUNTER — Ambulatory Visit (INDEPENDENT_AMBULATORY_CARE_PROVIDER_SITE_OTHER): Admitting: Otolaryngology

## 2024-02-15 ENCOUNTER — Telehealth: Payer: Self-pay

## 2024-02-15 NOTE — Telephone Encounter (Signed)
 RN return call to Christian Ward wanting to know if possible that he could take antibiotic medication once a day instead of BID due to having nausea.  Per [employer], PA-C to continue to take the medication as prescribed and Zofran  as needed for the nausea.  [name] verbalized understanding and said he would do as recommended.

## 2024-02-27 ENCOUNTER — Telehealth (INDEPENDENT_AMBULATORY_CARE_PROVIDER_SITE_OTHER): Payer: Self-pay | Admitting: Otolaryngology

## 2024-02-27 NOTE — Telephone Encounter (Signed)
 Returned patients voicemail, confirmed date and time of his appt  - and asked him to call back if needed.

## 2024-02-28 ENCOUNTER — Ambulatory Visit (INDEPENDENT_AMBULATORY_CARE_PROVIDER_SITE_OTHER): Admitting: Otolaryngology

## 2024-02-29 DIAGNOSIS — R350 Frequency of micturition: Secondary | ICD-10-CM | POA: Diagnosis not present

## 2024-03-06 ENCOUNTER — Other Ambulatory Visit: Payer: Self-pay | Admitting: Internal Medicine

## 2024-03-11 ENCOUNTER — Encounter (INDEPENDENT_AMBULATORY_CARE_PROVIDER_SITE_OTHER): Payer: Self-pay | Admitting: Otolaryngology

## 2024-03-11 ENCOUNTER — Ambulatory Visit (INDEPENDENT_AMBULATORY_CARE_PROVIDER_SITE_OTHER): Admitting: Otolaryngology

## 2024-03-11 VITALS — BP 138/78 | HR 66 | Temp 97.8°F

## 2024-03-11 DIAGNOSIS — H6123 Impacted cerumen, bilateral: Secondary | ICD-10-CM | POA: Diagnosis not present

## 2024-03-11 DIAGNOSIS — R0981 Nasal congestion: Secondary | ICD-10-CM

## 2024-03-11 DIAGNOSIS — R0982 Postnasal drip: Secondary | ICD-10-CM | POA: Diagnosis not present

## 2024-03-11 DIAGNOSIS — J310 Chronic rhinitis: Secondary | ICD-10-CM | POA: Diagnosis not present

## 2024-03-11 NOTE — Progress Notes (Signed)
 Patient ID: Christian Ward, male   DOB: 04-25-1951, 73 y.o.   MRN: [id number]  Follow-up: Chronic nasal congestion, postnasal drainage, recurrent cerumen impaction  HPI: The patient is a 73 year old male who returns today for his follow-up evaluation.  The patient was previously seen for his chronic nasal congestion and nasal drainage.  The patient was treated with Flonase  nasal spray and Atrovent  nasal spray.  The patient returns today reporting improvement in his symptoms with the medical treatment.  His drainage has significantly decreased with the use of Atrovent .  He has noted occasional constipation with the use of Atrovent .  Currently he denies any facial pain, fever, or visual change.  Exam: General: Communicates without difficulty, well nourished, no acute distress. Head: Normocephalic, no evidence injury, no tenderness, facial buttresses intact without stepoff. Face/sinus: No tenderness to palpation and percussion. Facial movement is normal and symmetric. Eyes: PERRL, EOMI. No scleral icterus, conjunctivae clear. Neuro: CN II exam reveals vision grossly intact.  No nystagmus at any point of gaze. Ears: Auricles well formed without lesions.  Bilateral cerumen impaction.  Nose: External evaluation reveals normal support and skin without lesions.  Dorsum is intact.  Anterior rhinoscopy reveals congested mucosa over anterior aspect of inferior turbinates and intact septum.  Oral:  Oral cavity and oropharynx are intact, symmetric, without erythema or edema.  Mucosa is moist without lesions. Neck: Full range of motion without pain.  There is no significant lymphadenopathy.  No masses palpable.  Thyroid  bed within normal limits to palpation.  Parotid glands and submandibular glands equal bilaterally without mass.  Trachea is midline. Neuro:  CN 2-12 grossly intact.     Procedure: Bilateral cerumen disimpaction Anesthesia: None Description: Under the operating microscope, the cerumen is carefully  removed with a combination of cerumen currette, alligator forceps, and suction catheters.  After the cerumen is removed, the TMs are noted to be normal.  No mass, erythema, or lesions. The patient tolerated the procedure well.     Assessment: 1.  Recurrent bilateral cerumen impaction.  After the disimpaction procedure, both tympanic membranes and middle ear spaces are noted to be normal. 2.  Chronic rhinitis with nasal mucosal congestion and chronic postnasal drainage.  The severity of his congestion has decreased. 3.  His nasal drainage is under control with the use of Atrovent .  He has noted occasional constipation due to the anticholinergic effect.   Plan: 1.  Otomicroscopy with bilateral cerumen disimpaction. 2.  The physical exam findings are reviewed with the patient. 3.  Continue with Flonase  nasal spray and Atrovent  nasal spray. 4.  The patient is instructed to lean forward while using the Atrovent  nasal spray.  He is also instructed to rinse his mouth after the use of Atrovent  to decrease the anticholinergic effect. 5.  The patient will return for reevaluation in 6 months.

## 2024-04-19 ENCOUNTER — Ambulatory Visit

## 2024-04-19 VITALS — Ht 76.0 in | Wt 220.0 lb

## 2024-04-19 DIAGNOSIS — Z0000 Encounter for general adult medical examination without abnormal findings: Secondary | ICD-10-CM

## 2024-04-19 NOTE — Progress Notes (Signed)
 Chief Complaint  Patient presents with   Medicare Wellness     Subjective:   Christian Ward is a 73 y.o. male who presents for a Medicare Annual Wellness Visit.  Visit info / Clinical Intake: Medicare Wellness Visit Type:: Subsequent Annual Wellness Visit Persons participating in visit and providing information:: patient Medicare Wellness Visit Mode:: Telephone If telephone:: video declined Since this visit was completed virtually, some vitals may be partially provided or unavailable. Missing vitals are due to the limitations of the virtual format.: Unable to obtain vitals - no equipment If Telephone or Video please confirm:: I connected with patient using audio/video enable telemedicine. I verified patient identity with two identifiers, discussed telehealth limitations, and patient agreed to proceed. Patient Location:: work Provider Location:: Office Interpreter Needed?: No Pre-visit prep was completed: yes AWV questionnaire completed by patient prior to visit?: no Living arrangements:: (!) lives alone Patient's Overall Health Status Rating: (!) fair Typical amount of pain: some (pelvic floor still sore) Does pain affect daily life?: (!) yes (has to sit on a pillow/something soft) Are you currently prescribed opioids?: no  Dietary Habits and Nutritional Risks How many meals a day?: 2 (sometimes 3) Eats fruit and vegetables daily?: (!) no (eat fruit everyday) Most meals are obtained by: having others provide food In the last 2 weeks, have you had any of the following?: none Diabetic:: no  Functional Status Activities of Daily Living (to include ambulation/medication): Independent Ambulation: Independent Medication Administration: Independent Home Management (perform basic housework or laundry): Independent Manage your own finances?: yes Primary transportation is: driving Concerns about vision?: no *vision screening is required for WTM* Concerns about hearing?:  no  Fall Screening Falls in the past year?: 0 Number of falls in past year: 0 Was there an injury with Fall?: 0 Fall Risk Category Calculator: 0 Patient Fall Risk Level: Low Fall Risk  Fall Risk Patient at Risk for Falls Due to: No Fall Risks Fall risk Follow up: Falls evaluation completed; Falls prevention discussed  Home and Transportation Safety: All rugs have non-skid backing?: N/A, no rugs All stairs or steps have railings?: yes (2 story home/1st floor living) Grab bars in the bathtub or shower?: yes Have non-skid surface in bathtub or shower?: yes Good home lighting?: yes Regular seat belt use?: yes Hospital stays in the last year:: no  Cognitive Assessment Difficulty concentrating, remembering, or making decisions? : no Will 6CIT or Mini Cog be Completed: no 6CIT or Mini Cog Declined: patient alert, oriented, able to answer questions appropriately and recall recent events  Advance Directives (For Healthcare) Does Patient Have a Medical Advance Directive?: Yes Type of Advance Directive: Healthcare Power of [place]; Living will Copy of Healthcare Power of Attorney in Chart?: No - copy requested Copy of Living Will in Chart?: No - copy requested  Reviewed/Updated  Reviewed/Updated: Reviewed All (Medical, Surgical, Family, Medications, Allergies, Care Teams, Patient Goals)    Allergies (verified) Tramadol and Oxycodone    Current Medications (verified) Outpatient Encounter Medications as of 04/19/2024  Medication Sig   acetaminophen  (TYLENOL ) 500 MG tablet Take 500 mg by mouth every 6 (six) hours as needed for mild pain.   CELEBREX 200 MG capsule Take 200 mg by mouth daily.   Cholecalciferol (VITAMIN D3 PO) Take 2,000 Units by mouth daily.   clonazePAM  (KLONOPIN ) 0.5 MG tablet Take 1 tablet (0.5 mg total) by mouth 2 (two) times daily as needed for anxiety.   Dietary Management Product (VASCULERA) TABS Take 1 tablet by mouth  daily.   fluticasone  (FLONASE ) 50 MCG/ACT  nasal spray SPRAY 2 SPRAYS INTO EACH NOSTRIL EVERY DAY   ipratropium (ATROVENT ) 0.06 % nasal spray Place 2 sprays into both nostrils 4 (four) times daily as needed for rhinitis.   Multiple Vitamin (MULTIVITAMIN) tablet Take 1 tablet by mouth daily.   omeprazole  (PRILOSEC) 20 MG capsule TAKE 1 CAPSULE BY MOUTH 2 TIMES DAILY BEFORE A MEAL. SCHEDULE AN APPT FOR FURTHER REFILLS   ondansetron  (ZOFRAN ) 4 MG tablet Take 1 tablet (4 mg total) by mouth every 8 (eight) hours as needed for nausea or vomiting.   oxybutynin (DITROPAN-XL) 5 MG 24 hr tablet Take 5 mg by mouth daily.   polyethylene glycol (MIRALAX / GLYCOLAX) 17 g packet Take 17 g by mouth daily as needed for mild constipation.   pregabalin (LYRICA) 50 MG capsule Take 50 mg by mouth as needed.   Probiotic Product (PROBIOTIC DAILY) CAPS Take 1 capsule by mouth daily.   promethazine  (PHENERGAN ) 25 MG tablet Take 1 tablet (25 mg total) by mouth every 6 (six) hours as needed for nausea or vomiting.   olmesartan  (BENICAR ) 20 MG tablet Take 1 tablet (20 mg total) by mouth daily. (Patient not taking: Reported on 04/19/2024)   rosuvastatin  (CRESTOR ) 5 MG tablet Take 1 tablet (5 mg total) by mouth daily. (Patient not taking: Reported on 04/19/2024)   No facility-administered encounter medications on file as of 04/19/2024.    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   S/P radiation therapy    Shoulder pain, left    Past Surgical History:  Procedure Laterality Date   APPENDECTOMY  age 10   CHOLECYSTECTOMY  s/p lap chole 4/07   Dr Gail   COLONOSCOPY     PROSTATE BIOPSY  2008, 10/2008   PROSTATECTOMY  08/06/2009   Gleason 4+3=7    SACROILIAC JOINT FUSION Right 09/08/2016   Procedure: RIGHT SIDED SACROILIAC JOINT FUSION;  Surgeon: Oneil Priestly, MD;  Location: [facility] OR;  Service: Orthopedics;  Laterality: Right;  RIGHT SIDED SACROILIAC JOINT FUSION   WISDOM TOOTH EXTRACTION     Family History  Problem Relation Age of Onset   Hypertension Father    Cancer Father        prostate   Stroke Father    COPD Father    Coronary artery disease Father    Lung disease Father    Heart disease Father    Diabetes Father    Lung disease Mother    Diabetes Sister    Cancer Brother        surgery   Social History   Occupational History   Occupation: [employer]  Tobacco Use   Smoking status: Former    Current packs/day: 0.00    Average packs/day: 0.5 packs/day for 10.0 years (5.0 ttl pk-yrs)    Types: Cigarettes    Start date: 05/09/1980    Quit date: 03/16/1986    Years since quitting: 38.1   Smokeless tobacco: Never  Vaping Use   Vaping status: Never Used  Substance and Sexual Activity   Alcohol use: Yes    Alcohol/week: 1.0  standard drink of alcohol    Types: 1 Glasses of wine per week    Comment: social drinker   Drug use: No   Sexual activity: Not Currently   Tobacco Counseling Counseling given: Not Answered  SDOH Screenings   Food Insecurity: Low Risk (04/11/2024)   Received from [facility]  Housing: Low Risk (04/11/2024)   Received from [facility]  Transportation Needs: No Transportation Needs (04/11/2024)   Received from [facility]  Utilities: Low Risk (04/11/2024)   Received from [facility]  Alcohol Screen: Low Risk (09/05/2023)  Depression (PHQ2-9): Low Risk (10/30/2023)  Tobacco Use: Medium Risk (04/19/2024)   See flowsheets for full screening details  Depression Screen PHQ 2 & 9 Depression Scale- Over the past 2 weeks, how often have you been bothered by any of the following problems? Little interest or pleasure in doing things: 0 Feeling down, depressed, or hopeless (PHQ Adolescent  also includes...irritable): 0 PHQ-2 Total Score: 0     Goals Addressed               This Visit's Progress     Patient Stated (pt-stated)        Keep working and to be healthy/be active/2025             Objective:    Today's Vitals   04/19/24 1251  Weight: 220 lb (99.8 kg)  Height: 6' 4 (1.93 m)   Body mass index is 26.78 kg/m.  Hearing/Vision screen Hearing Screening - Comments:: Denies hearing difficulties   Vision Screening - Comments:: Denies vision issues./had cataract surgery/[facility]/ Not UTD Immunizations and Health Maintenance Health Maintenance  Topic Date Due   Zoster Vaccines- Shingrix  (1 of 2) Never done   Influenza Vaccine  12/08/2023   COVID-19 Vaccine (5 - 2025-26 season) 01/08/2024   Colonoscopy  01/31/2024   Medicare Annual Wellness (AWV)  04/19/2025   DTaP/Tdap/Td (2 - Td or Tdap) 07/22/2028   Pneumococcal Vaccine: 50+ Years  Completed   Hepatitis C Screening  Completed   Meningococcal B Vaccine  Aged Out        Assessment/Plan:  This is a routine wellness examination for Christian Ward.  Patient Care Team: [name], MD as PCP - General (Internal Medicine) Rozella Toribio BROCKS, [facility] (Inactive) as Pharmacist (Pharmacist)  I have personally reviewed and noted the following in the patients chart:   Medical and social history Use of alcohol, tobacco or illicit drugs  Current medications and supplements including opioid prescriptions. Functional ability and status Nutritional status Physical activity Advanced directives List of other physicians Hospitalizations, surgeries, and ER visits in previous 12 months Vitals Screenings to include cognitive, depression, and falls Referrals and appointments  No orders of the defined types were placed in this encounter.  In addition, I have reviewed and discussed with patient certain preventive protocols, quality metrics, and best practice recommendations. A written personalized  care plan for preventive services as well as general preventive health recommendations were provided to patient.   Channel Papandrea L Tonjua Rossetti, CMA   04/19/2024   Return in 1 year (on 04/19/2025).  After Visit Summary: (Mail) Due to this being a telephonic visit, the after visit summary with patients personalized plan was offered to patient via mail   Nurse Notes: Patient is due for a flu vaccine and would like to get that done during his up coming office visit.  He is due for a colonoscopy and would like to discuss during his office visit with provider. He also  has some concerns and questions about medication, Clonazepam  0.5 mg, as well.  Patient had no other concerns to discuss today.

## 2024-04-19 NOTE — Patient Instructions (Addendum)
 Mr. Nestle,  Thank you for taking the time for your Medicare Wellness Visit. I appreciate your continued commitment to your health goals. Please review the care plan we discussed, and feel free to reach out if I can assist you further.  Please note that Annual Wellness Visits do not include a physical exam. Some assessments may be limited, especially if the visit was conducted virtually. If needed, we may recommend an in-person follow-up with your provider.  Ongoing Care Seeing your primary care provider every 3 to 6 months helps us  monitor your health and provide consistent, personalized care. Next office visit on 04/22/2024.  You are due for a flu vaccine and can get that during your up coming office visit. Each day, aim for 6 glasses of water, plenty of protein in your diet and try to get up and walk/ stretch every hour for 5-10 minutes at a time.    Referrals If a referral was made during today's visit and you haven't received any updates within two weeks, please contact the referred provider directly to check on the status.  Recommended Screenings:  Health Maintenance  Topic Date Due   Zoster (Shingles) Vaccine (1 of 2) Never done   Flu Shot  12/08/2023   COVID-19 Vaccine (5 - 2025-26 season) 01/08/2024   Colon Cancer Screening  01/31/2024   Medicare Annual Wellness Visit  04/19/2025   DTaP/Tdap/Td vaccine (2 - Td or Tdap) 07/22/2028   Pneumococcal Vaccine for age over 15  Completed   Hepatitis C Screening  Completed   Meningitis B Vaccine  Aged Out       04/19/2024    1:22 PM  Advanced Directives  Does Patient Have a Medical Advance Directive? Yes  Type of Estate Agent of [place];Living will  Copy of Healthcare Power of Attorney in Chart? No - copy requested    Vision: Annual vision screenings are recommended for early detection of glaucoma, cataracts, and diabetic retinopathy. These exams can also reveal signs of chronic conditions such as diabetes  and high blood pressure.  Dental: Annual dental screenings help detect early signs of oral cancer, gum disease, and other conditions linked to overall health, including heart disease and diabetes.  Please see the attached documents for additional preventive care recommendations.

## 2024-04-22 ENCOUNTER — Encounter: Payer: Self-pay | Admitting: Internal Medicine

## 2024-04-22 ENCOUNTER — Ambulatory Visit: Admitting: Internal Medicine

## 2024-04-22 VITALS — BP 136/84 | HR 64 | Temp 97.7°F | Resp 16 | Ht 76.0 in | Wt 216.4 lb

## 2024-04-22 DIAGNOSIS — I10 Essential (primary) hypertension: Secondary | ICD-10-CM

## 2024-04-22 DIAGNOSIS — E785 Hyperlipidemia, unspecified: Secondary | ICD-10-CM | POA: Diagnosis not present

## 2024-04-22 DIAGNOSIS — D638 Anemia in other chronic diseases classified elsewhere: Secondary | ICD-10-CM | POA: Diagnosis not present

## 2024-04-22 DIAGNOSIS — C61 Malignant neoplasm of prostate: Secondary | ICD-10-CM

## 2024-04-22 DIAGNOSIS — Z23 Encounter for immunization: Secondary | ICD-10-CM

## 2024-04-22 DIAGNOSIS — Z1211 Encounter for screening for malignant neoplasm of colon: Secondary | ICD-10-CM

## 2024-04-22 DIAGNOSIS — D509 Iron deficiency anemia, unspecified: Secondary | ICD-10-CM

## 2024-04-22 LAB — LIPID PANEL
Cholesterol: 148 mg/dL (ref 0–200)
HDL: 38.4 mg/dL — ABNORMAL LOW (ref >39.00)
LDL Cholesterol: 93 mg/dL (ref 0–99)
NonHDL: 109.62
Total CHOL/HDL Ratio: 4
Triglycerides: 85 mg/dL (ref 0.0–149.0)
VLDL: 17 mg/dL (ref 0.0–40.0)

## 2024-04-22 LAB — IBC + FERRITIN
Ferritin: 116.5 ng/mL (ref 22.0–322.0)
Iron: 104 ug/dL (ref 42–165)
Saturation Ratios: 31.5 % (ref 20.0–50.0)
TIBC: 330.4 ug/dL (ref 250.0–450.0)
Transferrin: 236 mg/dL (ref 212.0–360.0)

## 2024-04-22 LAB — BASIC METABOLIC PANEL WITH GFR
BUN: 21 mg/dL (ref 6–23)
CO2: 30 meq/L (ref 19–32)
Calcium: 9.3 mg/dL (ref 8.4–10.5)
Chloride: 106 meq/L (ref 96–112)
Creatinine, Ser: 1.03 mg/dL (ref 0.40–1.50)
GFR: 72.43 mL/min (ref >60.00)
Glucose, Bld: 106 mg/dL — ABNORMAL HIGH (ref 70–99)
Potassium: 3.9 meq/L (ref 3.5–5.1)
Sodium: 141 meq/L (ref 135–145)

## 2024-04-22 LAB — CBC WITH DIFFERENTIAL/PLATELET
Basophils Absolute: 0 K/uL (ref 0.0–0.1)
Basophils Relative: 0.4 % (ref 0.0–3.0)
Eosinophils Absolute: 0.3 K/uL (ref 0.0–0.7)
Eosinophils Relative: 3.9 % (ref 0.0–5.0)
HCT: 34.8 % — ABNORMAL LOW (ref 39.0–52.0)
Hemoglobin: 12 g/dL — ABNORMAL LOW (ref 13.0–17.0)
Lymphocytes Relative: 14.3 % (ref 12.0–46.0)
Lymphs Abs: 0.9 K/uL (ref 0.7–4.0)
MCHC: 34.4 g/dL (ref 30.0–36.0)
MCV: 92.2 fl (ref 78.0–100.0)
Monocytes Absolute: 0.6 K/uL (ref 0.1–1.0)
Monocytes Relative: 8.5 % (ref 3.0–12.0)
Neutro Abs: 4.8 K/uL (ref 1.4–7.7)
Neutrophils Relative %: 72.9 % (ref 43.0–77.0)
Platelets: 184 K/uL (ref 150.0–400.0)
RBC: 3.78 Mil/uL — ABNORMAL LOW (ref 4.22–5.81)
RDW: 13.1 % (ref 11.5–15.5)
WBC: 6.6 K/uL (ref 4.0–10.5)

## 2024-04-22 LAB — RETICULOCYTES
ABS Retic: 30800 {cells}/uL (ref 25000–90000)
Retic Ct Pct: 0.8 %

## 2024-04-22 MED ORDER — COVID-19 MRNA VAC-TRIS(PFIZER) 30 MCG/0.3ML IM SUSY: 0.3000 mL | PREFILLED_SYRINGE | Freq: Once | INTRAMUSCULAR | 0 refills | Status: AC

## 2024-04-22 MED ORDER — ROSUVASTATIN CALCIUM 10 MG PO TABS: 10.0000 mg | ORAL_TABLET | Freq: Every day | ORAL | 0 refills | Status: AC

## 2024-04-22 NOTE — Progress Notes (Unsigned)
 Subjective:  Patient ID: Christian Ward, male    DOB: 1951-04-15  Age: 73 y.o. MRN: [id number]  CC: Anemia, Hypertension, and Hyperlipidemia   HPI [name] presents for f/up ---  Discussed the use of AI scribe software for clinical note transcription with the patient, who gave verbal consent to proceed.  History of Present Illness Christian Ward is a 73 year old male with prostate cancer who presents with urinary incontinence and pelvic pain following recent radiation therapy.  He has a history of prostate cancer, initially treated with prostatectomy in 2011. In 2015, his PSA levels began to rise, leading to five weeks of radiation therapy. His PSA levels normalized until 2024, when he began to increase again, reaching 7.5 by March 2025. A PET scan in April 2025 revealed a hot spot under the bladder, leading to two weeks of direct beam radiation in May 2025. His PSA decreased to 2.5 by September 2025.  In October 2025, he began experiencing urinary incontinence, burning during urination, and pelvic floor pain. He was diagnosed with a UTI and treated with a 10-day course of antibiotics, but symptoms persisted despite negative urine cultures. A cystoscopy revealed a mass, which was biopsied on March 15, 2024, and found to be non-cancerous. He experienced significant side effects, including complete urinary incontinence, bladder spasms, and pelvic pain. He is currently taking oxybutynin for bladder spasms, which has been effective in reducing spasms and hematuria. He reports ongoing pelvic pain, managed with four ibuprofen daily, and uses a pillow for sitting due to discomfort.  He is 'totally one hundred percent incontinent' and wears Depends. He experiences urinary leakage when standing or walking, which he finds socially and personally distressing. He has not had coffee, soda, or alcohol in six weeks, drinking only water to manage bladder symptoms. He has lost about 10 pounds and  reports feeling generally well, with a decent amount of energy, despite the incontinence.  No chest pain, shortness of breath, nausea, vomiting, fever, chills, or signs of blood loss. He takes a multivitamin, vitamin D3, vitamin C occasionally, and a probiotic. He reports sinus drainage managed with Flonase  and another nasal spray, which causes constipation. He is not currently experiencing symptoms of anemia.     Outpatient Medications Prior to Visit  Medication Sig Dispense Refill   acetaminophen  (TYLENOL ) 500 MG tablet Take 500 mg by mouth every 6 (six) hours as needed for mild pain.     CELEBREX 200 MG capsule Take 200 mg by mouth daily.     Cholecalciferol (VITAMIN D3 PO) Take 2,000 Units by mouth daily.     clonazePAM  (KLONOPIN ) 0.5 MG tablet Take 1 tablet (0.5 mg total) by mouth 2 (two) times daily as needed for anxiety. 180 tablet 0   Dietary Management Product (VASCULERA) TABS Take 1 tablet by mouth daily. 30 tablet 11   fluticasone  (FLONASE ) 50 MCG/ACT nasal spray SPRAY 2 SPRAYS INTO EACH NOSTRIL EVERY DAY 16 mL 5   ipratropium (ATROVENT ) 0.06 % nasal spray Place 2 sprays into both nostrils 4 (four) times daily as needed for rhinitis. 15 mL 5   Multiple Vitamin (MULTIVITAMIN) tablet Take 1 tablet by mouth daily.     omeprazole  (PRILOSEC) 20 MG capsule TAKE 1 CAPSULE BY MOUTH 2 TIMES DAILY BEFORE A MEAL. SCHEDULE AN APPT FOR FURTHER REFILLS 60 capsule 3   ondansetron  (ZOFRAN ) 4 MG tablet Take 1 tablet (4 mg total) by mouth every 8 (eight) hours as needed for nausea or vomiting.  20 tablet 3   oxybutynin (DITROPAN-XL) 5 MG 24 hr tablet Take 5 mg by mouth daily.     polyethylene glycol (MIRALAX / GLYCOLAX) 17 g packet Take 17 g by mouth daily as needed for mild constipation.     pregabalin (LYRICA) 50 MG capsule Take 50 mg by mouth as needed.     Probiotic Product (PROBIOTIC DAILY) CAPS Take 1 capsule by mouth daily.     promethazine  (PHENERGAN ) 25 MG tablet Take 1 tablet (25 mg total)  by mouth every 6 (six) hours as needed for nausea or vomiting. 30 tablet 3   olmesartan  (BENICAR ) 20 MG tablet Take 1 tablet (20 mg total) by mouth daily. (Patient not taking: Reported on 04/19/2024) 90 tablet 0   rosuvastatin  (CRESTOR ) 5 MG tablet Take 1 tablet (5 mg total) by mouth daily. (Patient not taking: Reported on 04/19/2024) 90 tablet 1   No facility-administered medications prior to visit.    ROS Review of Systems  Objective:  BP 136/84 (BP Location: Left Arm, Patient Position: Sitting, Cuff Size: Normal)   Pulse 64   Temp 97.7 F (36.5 C) (Oral)   Resp 16   Ht 6' 4 (1.93 m)   Wt 216 lb 6.4 oz (98.2 kg)   SpO2 94%   BMI 26.34 kg/m   BP Readings from Last 3 Encounters:  04/22/24 136/84  03/11/24 138/78  02/09/24 (!) 158/84    Wt Readings from Last 3 Encounters:  04/22/24 216 lb 6.4 oz (98.2 kg)  04/19/24 220 lb (99.8 kg)  02/09/24 220 lb 3.2 oz (99.9 kg)    Physical Exam  Lab Results  Component Value Date   WBC 6.6 04/22/2024   HGB 12.0 (L) 04/22/2024   HCT 34.8 (L) 04/22/2024   PLT 184.0 04/22/2024   GLUCOSE 106 (H) 04/22/2024   CHOL 148 04/22/2024   TRIG 85.0 04/22/2024   HDL 38.40 (L) 04/22/2024   LDLCALC 93 04/22/2024   ALT 22 10/30/2023   AST 23 10/30/2023   NA 141 04/22/2024   K 3.9 04/22/2024   CL 106 04/22/2024   CREATININE 1.03 04/22/2024   BUN 21 04/22/2024   CO2 30 04/22/2024   TSH 1.28 10/30/2023   PSA 2.6 01/11/2024   INR 1.06 08/31/2016   HGBA1C 5.8 03/09/2021   The 10-year ASCVD risk score (Arnett DK, et al., 2019) is: 22.1%   Values used to calculate the score:     Age: 89 years     Clinically relevant sex: Male     Is Non-Hispanic African American: No     Diabetic: No     Tobacco smoker: No     Systolic Blood Pressure: 136 mmHg     Is BP treated: No     HDL Cholesterol: 38.4 mg/dL     Total Cholesterol: 148 mg/dL   Assessment & Plan:  Dyslipidemia, goal LDL below 100 -     Lipid panel; Future -     Rosuvastatin   Calcium ; Take 1 tablet (10 mg total) by mouth daily.  Dispense: 90 tablet; Refill: 0  Anemia, chronic disease -     CBC with Differential/Platelet; Future -     Reticulocytes; Future  Iron deficiency anemia, unspecified iron deficiency anemia type -     IBC + Ferritin; Future -     CBC with Differential/Platelet; Future  Essential hypertension -     Basic metabolic panel with GFR; Future  Need for immunization against influenza -  Flu vaccine HIGH DOSE PF(Fluzone Trivalent)  Prostate carcinoma, recurrent (HCC) -     COVID-19 mRNA Vac-TriS(Pfizer); Inject 0.3 mLs into the muscle once for 1 dose.  Dispense: 0.3 mL; Refill: 0  Screening for colon cancer -     Ambulatory referral to Gastroenterology     Follow-up: Return in about 3 months (around 07/21/2024).  Debby Molt, MD

## 2024-04-22 NOTE — Patient Instructions (Signed)

## 2024-04-23 ENCOUNTER — Ambulatory Visit: Payer: Self-pay | Admitting: Internal Medicine

## 2024-05-18 ENCOUNTER — Other Ambulatory Visit: Payer: Self-pay | Admitting: Internal Medicine

## 2024-06-11 ENCOUNTER — Encounter: Payer: Self-pay | Admitting: Gastroenterology

## 2024-09-03 ENCOUNTER — Ambulatory Visit (INDEPENDENT_AMBULATORY_CARE_PROVIDER_SITE_OTHER): Admitting: Otolaryngology
